# Patient Record
Sex: Female | Born: 1950
Health system: Southern US, Community
[De-identification: ages and names within clinical notes are randomized; demographics above are authoritative.]

## PROBLEM LIST (undated history)

## (undated) DIAGNOSIS — E669 Obesity, unspecified: Secondary | ICD-10-CM

## (undated) DIAGNOSIS — K589 Irritable bowel syndrome without diarrhea: Secondary | ICD-10-CM

## (undated) DIAGNOSIS — K5790 Diverticulosis of intestine, part unspecified, without perforation or abscess without bleeding: Secondary | ICD-10-CM

## (undated) DIAGNOSIS — R011 Cardiac murmur, unspecified: Secondary | ICD-10-CM

## (undated) DIAGNOSIS — F419 Anxiety disorder, unspecified: Secondary | ICD-10-CM

## (undated) DIAGNOSIS — F32A Depression, unspecified: Secondary | ICD-10-CM

## (undated) DIAGNOSIS — I10 Essential (primary) hypertension: Secondary | ICD-10-CM

## (undated) DIAGNOSIS — F988 Other specified behavioral and emotional disorders with onset usually occurring in childhood and adolescence: Secondary | ICD-10-CM

## (undated) DIAGNOSIS — K219 Gastro-esophageal reflux disease without esophagitis: Secondary | ICD-10-CM

## (undated) DIAGNOSIS — K5792 Diverticulitis of intestine, part unspecified, without perforation or abscess without bleeding: Secondary | ICD-10-CM

## (undated) DIAGNOSIS — E039 Hypothyroidism, unspecified: Secondary | ICD-10-CM

## (undated) DIAGNOSIS — R600 Localized edema: Secondary | ICD-10-CM

## (undated) DIAGNOSIS — E079 Disorder of thyroid, unspecified: Secondary | ICD-10-CM

## (undated) DIAGNOSIS — T7840XA Allergy, unspecified, initial encounter: Secondary | ICD-10-CM

## (undated) HISTORY — PX: OVARIAN CYST REMOVAL: SHX89

## (undated) HISTORY — DX: Other specified behavioral and emotional disorders with onset usually occurring in childhood and adolescence: F98.8

## (undated) HISTORY — DX: Allergy, unspecified, initial encounter: T78.40XA

## (undated) HISTORY — PX: CATARACT EXTRACTION: SUR2

## (undated) HISTORY — DX: Depression, unspecified: F32.A

## (undated) HISTORY — DX: Cardiac murmur, unspecified: R01.1

## (undated) HISTORY — DX: Gastro-esophageal reflux disease without esophagitis: K21.9

## (undated) HISTORY — DX: Hypothyroidism, unspecified: E03.9

## (undated) HISTORY — DX: Obesity, unspecified: E66.9

## (undated) HISTORY — DX: Irritable bowel syndrome, unspecified: K58.9

## (undated) HISTORY — DX: Localized edema: R60.0

## (undated) HISTORY — PX: ANKLE SURGERY: SHX546

## (undated) HISTORY — DX: Anxiety disorder, unspecified: F41.9

## (undated) HISTORY — PX: BLEPHAROPLASTY: SUR158

---

## 1979-06-14 HISTORY — PX: ABDOMINAL HYSTERECTOMY: SHX81

## 1998-08-10 ENCOUNTER — Ambulatory Visit (HOSPITAL_COMMUNITY): Admission: RE | Admit: 1998-08-10 | Discharge: 1998-08-10 | Payer: Self-pay | Admitting: Internal Medicine

## 1998-08-10 ENCOUNTER — Encounter: Payer: Self-pay | Admitting: Internal Medicine

## 1998-11-23 ENCOUNTER — Encounter: Payer: Self-pay | Admitting: Emergency Medicine

## 1998-11-23 ENCOUNTER — Emergency Department (HOSPITAL_COMMUNITY): Admission: EM | Admit: 1998-11-23 | Discharge: 1998-11-23 | Payer: Self-pay | Admitting: Emergency Medicine

## 1998-12-31 ENCOUNTER — Ambulatory Visit (HOSPITAL_COMMUNITY): Admission: RE | Admit: 1998-12-31 | Discharge: 1998-12-31 | Payer: Self-pay | Admitting: Gastroenterology

## 1999-02-26 ENCOUNTER — Other Ambulatory Visit: Admission: RE | Admit: 1999-02-26 | Discharge: 1999-02-26 | Payer: Self-pay | Admitting: Gynecology

## 1999-03-16 ENCOUNTER — Ambulatory Visit (HOSPITAL_COMMUNITY): Admission: RE | Admit: 1999-03-16 | Discharge: 1999-03-16 | Payer: Self-pay | Admitting: Gynecology

## 1999-03-24 ENCOUNTER — Encounter: Payer: Self-pay | Admitting: Gynecology

## 1999-03-24 ENCOUNTER — Inpatient Hospital Stay (HOSPITAL_COMMUNITY): Admission: RE | Admit: 1999-03-24 | Discharge: 1999-03-24 | Payer: Self-pay | Admitting: Gynecology

## 2003-08-19 ENCOUNTER — Encounter: Payer: Self-pay | Admitting: Nurse Practitioner

## 2012-12-06 ENCOUNTER — Encounter (INDEPENDENT_AMBULATORY_CARE_PROVIDER_SITE_OTHER): Payer: Self-pay | Admitting: Surgery

## 2012-12-06 ENCOUNTER — Other Ambulatory Visit (INDEPENDENT_AMBULATORY_CARE_PROVIDER_SITE_OTHER): Payer: Self-pay

## 2012-12-06 ENCOUNTER — Ambulatory Visit (INDEPENDENT_AMBULATORY_CARE_PROVIDER_SITE_OTHER): Payer: BC Managed Care – PPO | Admitting: Surgery

## 2012-12-06 DIAGNOSIS — E6609 Other obesity due to excess calories: Secondary | ICD-10-CM | POA: Insufficient documentation

## 2012-12-06 DIAGNOSIS — Z6841 Body Mass Index (BMI) 40.0 and over, adult: Secondary | ICD-10-CM

## 2012-12-06 DIAGNOSIS — I10 Essential (primary) hypertension: Secondary | ICD-10-CM

## 2012-12-06 DIAGNOSIS — K2100 Gastro-esophageal reflux disease with esophagitis, without bleeding: Secondary | ICD-10-CM

## 2012-12-06 DIAGNOSIS — E66812 Obesity, class 2: Secondary | ICD-10-CM | POA: Insufficient documentation

## 2012-12-06 NOTE — Patient Instructions (Addendum)

## 2012-12-06 NOTE — Progress Notes (Signed)
Chief Complaint:  Obesity and desires sleeve gastrectomy  History of Present Illness:  Shannon Reyes is an 62 y.o. female who has been to our seminar and desires sleeve gastrictomy.  The main drawback to this is her history of GERD.  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    She has good insight into her weight gain.  She has tried multiple attempts to lose weight without sustained success.  She grew up in [place].  Past Medical History  Diagnosis Date  . GERD (gastroesophageal reflux disease)     Past Surgical History  Procedure Laterality Date  . Cesarean section    . Ovarian cyst removal    . Abdominal hysterectomy    . Ankle fusion      Current Outpatient Prescriptions  Medication Sig Dispense Refill  . amLODipine-benazepril (LOTREL) 10-40 MG per capsule Take 1 capsule by mouth daily.      Marland Kitchen b complex-C-folic acid 1 MG capsule Take 1 capsule by mouth daily.      Marland Kitchen buPROPion (WELLBUTRIN) 75 MG tablet Take 75 mg by mouth 2 (two) times daily.      Marland Kitchen levothyroxine (SYNTHROID, LEVOTHROID) 150 MCG tablet Take 150 mcg by mouth daily before breakfast.      . lisinopril (PRINIVIL,ZESTRIL) 20 MG tablet Take 20 mg by mouth daily.      . Multiple Vitamins-Minerals (CENTRUM SILVER ULTRA WOMENS PO) Take by mouth.       No current facility-administered medications for this visit.   Review of patient's allergies indicates not on file. Family History  Problem Relation Age of Onset  . Cancer Paternal Grandmother    Social History:   reports that she has never smoked. She has never used smokeless tobacco. She reports that  drinks alcohol. She reports that she does not use illicit drugs.   REVIEW OF SYSTEMS - PERTINENT POSITIVES ONLY: Neg for DVT  Physical Exam:   Blood pressure 160/90, pulse 76, resp. rate 14, height 5' 3.75" (1.619 m),  weight 235 lb (106.595 kg). Body mass index is 40.67 kg/(m^2).  Gen:  WDWN WF NAD  Neurological: Alert and oriented to person, place, and time. Motor and sensory function is grossly intact  Head: Normocephalic and atraumatic.  Eyes: Conjunctivae are normal. Pupils are equal, round, and reactive to light. No scleral icterus.  Neck: Normal range of motion. Neck supple. No tracheal deviation or thyromegaly present.  Cardiovascular:  SR without murmurs or gallops.  No carotid bruits Respiratory: Effort normal.  No respiratory distress. No chest wall tenderness. Breath sounds normal.  No wheezes, rales or rhonchi.  Abdomen:  nontender and no prior abdominal surgery GU: Musculoskeletal: Normal range of motion. Extremities are nontender. No cyanosis, edema or clubbing noted Lymphadenopathy: No cervical, preauricular, postauricular or axillary adenopathy is present Skin: Skin is warm and dry. No rash noted. No diaphoresis. No erythema. No pallor. Pscyh: Normal mood and affect. Behavior is normal. Judgment and thought content normal.   LABORATORY RESULTS: @results48 @  RADIOLOGY RESULTS: No results found.  Problem List: There are no active problems to display for this patient.   Assessment & Plan: Morbid obesty with BMI 41 and hypertension and GERD.  Will assess for size of hiatus hernia before making a final determination as to sleeve or roux Y.  Shannon B. Daphine Deutscher, MD, [facility] [phone] beeper [phone]  12/06/2012 11:50 AM

## 2012-12-17 ENCOUNTER — Encounter (INDEPENDENT_AMBULATORY_CARE_PROVIDER_SITE_OTHER): Payer: Self-pay | Admitting: Surgery

## 2012-12-18 ENCOUNTER — Ambulatory Visit (HOSPITAL_COMMUNITY): Payer: BC Managed Care – PPO

## 2012-12-31 ENCOUNTER — Ambulatory Visit (INDEPENDENT_AMBULATORY_CARE_PROVIDER_SITE_OTHER): Payer: BC Managed Care – PPO | Admitting: Cardiovascular Disease

## 2012-12-31 ENCOUNTER — Telehealth: Payer: Self-pay | Admitting: Cardiovascular Disease

## 2012-12-31 ENCOUNTER — Ambulatory Visit: Payer: BC Managed Care – PPO | Admitting: *Deleted

## 2012-12-31 ENCOUNTER — Ambulatory Visit (HOSPITAL_COMMUNITY): Payer: BC Managed Care – PPO | Attending: Cardiology | Admitting: Radiology

## 2012-12-31 ENCOUNTER — Encounter: Payer: Self-pay | Admitting: Cardiovascular Disease

## 2012-12-31 VITALS — BP 124/80 | HR 80 | Ht 64.0 in | Wt 238.0 lb

## 2012-12-31 DIAGNOSIS — E669 Obesity, unspecified: Secondary | ICD-10-CM | POA: Insufficient documentation

## 2012-12-31 DIAGNOSIS — I2541 Coronary artery aneurysm: Secondary | ICD-10-CM | POA: Insufficient documentation

## 2012-12-31 DIAGNOSIS — I10 Essential (primary) hypertension: Secondary | ICD-10-CM | POA: Insufficient documentation

## 2012-12-31 DIAGNOSIS — Z01818 Encounter for other preprocedural examination: Secondary | ICD-10-CM

## 2012-12-31 DIAGNOSIS — I253 Aneurysm of heart: Secondary | ICD-10-CM

## 2012-12-31 DIAGNOSIS — Z01810 Encounter for preprocedural cardiovascular examination: Secondary | ICD-10-CM

## 2012-12-31 DIAGNOSIS — E7800 Pure hypercholesterolemia, unspecified: Secondary | ICD-10-CM | POA: Insufficient documentation

## 2012-12-31 DIAGNOSIS — R011 Cardiac murmur, unspecified: Secondary | ICD-10-CM | POA: Insufficient documentation

## 2012-12-31 NOTE — Assessment & Plan Note (Signed)
Diet Rx and weight loss Consider coronary calcium score next visit to further risk stratify

## 2012-12-31 NOTE — Assessment & Plan Note (Signed)
Will get records from [facility] cath 2004  Suspect it is not clinically relavant

## 2012-12-31 NOTE — Assessment & Plan Note (Signed)
F/U echo Sounds like AV sclerosis.  Doubt it is related to fistula

## 2012-12-31 NOTE — Assessment & Plan Note (Signed)
No symptoms normal ECG Low risk for cardiac complication during orthopedic surgery Clear to have this

## 2012-12-31 NOTE — Progress Notes (Signed)
Patient ID: Shannon Reyes, female   DOB: 10-09-1950, 62 y.o.   MRN: [id number] 62 yo referred by Dr Victorino Dike for orthopedic clearance .  She has no real cardiac history.  Has rod/plate in Right ankle 2004 and now plate is irritating her and needs to be removed. Same year was under a lot of stress from [employer] business in [place].  Cath with no CAD but ? Coronary fistula.  No chest pain dyspnea, palpitations or syncope.  HTN well Rx Last surgery was ankle 10 years ago with no complications.  On thyroid replacement and indicates TSH has been normal   Reviewed records from [facility] Dr Chase Caller TSH 2.78 07/12/12 HCT 41.5  LDL 124  Cr .69   ROS: Denies fever, malais, weight loss, blurry vision, decreased visual acuity, cough, sputum, SOB, hemoptysis, pleuritic pain, palpitaitons, heartburn, abdominal pain, melena, lower extremity edema, claudication, or rash.  All other systems reviewed and negative   General: Affect appropriate Overweight white female HEENT: normal Neck supple with no adenopathy JVP normal no bruits no thyromegaly Lungs clear with no wheezing and good diaphragmatic motion Heart:  S1/S2 SEM murmur,rub, gallop or click PMI normal Abdomen: benighn, BS positve, no tenderness, no AAA no bruit.  No HSM or HJR Distal pulses intact with no bruits No edema Neuro non-focal Skin warm and dry No muscular weakness  Medications Current Outpatient Prescriptions  Medication Sig Dispense Refill  . amLODipine (NORVASC) 10 MG tablet Take 10 mg by mouth daily.      Marland Kitchen buPROPion (WELLBUTRIN) 75 MG tablet Take 75 mg by mouth at bedtime.       Marland Kitchen levothyroxine (SYNTHROID, LEVOTHROID) 150 MCG tablet Take 150 mcg by mouth daily before breakfast.      . lisinopril-hydrochlorothiazide (PRINZIDE,ZESTORETIC) 20-25 MG per tablet Take 1 tablet by mouth daily.       No current facility-administered medications for this visit.    Allergies Review of patient's allergies indicates not on  file.  Family History: Family History  Problem Relation Age of Onset  . Cancer Paternal Grandmother     Social History: History   Social History  . Marital Status: Married    Spouse Name: N/A    Number of Children: N/A  . Years of Education: N/A   Occupational History  . Not on file.   Social History Main Topics  . Smoking status: Never Smoker   . Smokeless tobacco: Never Used  . Alcohol Use: Yes  . Drug Use: No  . Sexually Active: Not on file   Other Topics Concern  . Not on file   Social History Narrative  . No narrative on file    Electrocardiogram: SR rate 80 normal ECG   Assessment and Plan

## 2012-12-31 NOTE — Telephone Encounter (Signed)
New problem   Sherri/[facility] she received cardiac clearance but have a question about pt see PCP. Please call Sherri

## 2012-12-31 NOTE — Assessment & Plan Note (Signed)
Well controlled.  Continue current medications and low sodium Dash type diet.    

## 2012-12-31 NOTE — Telephone Encounter (Signed)
N/A X1 ./CY

## 2012-12-31 NOTE — Progress Notes (Signed)
Echocardiogram performed.  

## 2012-12-31 NOTE — Patient Instructions (Addendum)

## 2013-01-01 LAB — CBC WITH DIFFERENTIAL/PLATELET
Basophils Absolute: 0.1 10*3/uL (ref 0.0–0.1)
Basophils Relative: 1 % (ref 0–1)
Eosinophils Relative: 3 % (ref 0–5)
HCT: 39.3 % (ref 36.0–46.0)
Lymphocytes Relative: 28 % (ref 12–46)
MCHC: 33.3 g/dL (ref 30.0–36.0)
Monocytes Absolute: 0.5 10*3/uL (ref 0.1–1.0)
Neutro Abs: 5.5 10*3/uL (ref 1.7–7.7)
Platelets: 280 10*3/uL (ref 150–400)
RDW: 15.1 % (ref 11.5–15.5)
WBC: 8.8 10*3/uL (ref 4.0–10.5)

## 2013-01-01 LAB — TSH: TSH: 2.11 u[IU]/mL (ref 0.350–4.500)

## 2013-01-03 ENCOUNTER — Ambulatory Visit (HOSPITAL_COMMUNITY)
Admission: RE | Admit: 2013-01-03 | Discharge: 2013-01-03 | Disposition: A | Discharge status: Home / Self Care | Payer: BC Managed Care – PPO | Source: Ambulatory Visit | Attending: Surgery | Admitting: Surgery

## 2013-01-03 DIAGNOSIS — K449 Diaphragmatic hernia without obstruction or gangrene: Secondary | ICD-10-CM | POA: Insufficient documentation

## 2013-01-03 DIAGNOSIS — K219 Gastro-esophageal reflux disease without esophagitis: Secondary | ICD-10-CM | POA: Insufficient documentation

## 2013-01-03 DIAGNOSIS — Z01812 Encounter for preprocedural laboratory examination: Secondary | ICD-10-CM | POA: Insufficient documentation

## 2013-01-03 DIAGNOSIS — Z780 Asymptomatic menopausal state: Secondary | ICD-10-CM | POA: Insufficient documentation

## 2013-01-03 DIAGNOSIS — Z01818 Encounter for other preprocedural examination: Secondary | ICD-10-CM | POA: Insufficient documentation

## 2013-01-03 DIAGNOSIS — Z6841 Body Mass Index (BMI) 40.0 and over, adult: Secondary | ICD-10-CM | POA: Insufficient documentation

## 2013-01-03 DIAGNOSIS — R011 Cardiac murmur, unspecified: Secondary | ICD-10-CM | POA: Insufficient documentation

## 2013-01-03 DIAGNOSIS — Z13820 Encounter for screening for osteoporosis: Secondary | ICD-10-CM | POA: Insufficient documentation

## 2013-01-03 DIAGNOSIS — Z1231 Encounter for screening mammogram for malignant neoplasm of breast: Secondary | ICD-10-CM | POA: Insufficient documentation

## 2013-01-03 DIAGNOSIS — I10 Essential (primary) hypertension: Secondary | ICD-10-CM | POA: Insufficient documentation

## 2013-01-10 NOTE — Telephone Encounter (Signed)
UNABLE  TO REACH SHERI AT  SAID NUMBER   IF NEEDS ANSWER  WILL NEED TO CALL OFFICE BACK   .Zack Seal

## 2013-01-25 ENCOUNTER — Ambulatory Visit (INDEPENDENT_AMBULATORY_CARE_PROVIDER_SITE_OTHER): Payer: BC Managed Care – PPO | Admitting: Surgery

## 2013-01-25 ENCOUNTER — Encounter (INDEPENDENT_AMBULATORY_CARE_PROVIDER_SITE_OTHER): Payer: Self-pay | Admitting: Surgery

## 2013-01-25 ENCOUNTER — Telehealth (INDEPENDENT_AMBULATORY_CARE_PROVIDER_SITE_OTHER): Payer: Self-pay

## 2013-01-25 VITALS — BP 118/86 | HR 84 | Resp 16 | Ht 64.0 in | Wt 241.4 lb

## 2013-01-25 DIAGNOSIS — Z6841 Body Mass Index (BMI) 40.0 and over, adult: Secondary | ICD-10-CM

## 2013-01-25 DIAGNOSIS — K219 Gastro-esophageal reflux disease without esophagitis: Secondary | ICD-10-CM | POA: Insufficient documentation

## 2013-01-25 DIAGNOSIS — K2100 Gastro-esophageal reflux disease with esophagitis, without bleeding: Secondary | ICD-10-CM

## 2013-01-25 NOTE — Progress Notes (Signed)
Shannon Reyes 62 y.o.  Body mass index is 41.42 kg/(m^2).  Patient Active Problem List   Diagnosis Date Noted  . HTN (hypertension) 12/31/2012  . Murmur 12/31/2012  . 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 Elevated LDL cholesterol level 12/31/2012  . Preop cardiovascular exam 12/31/2012  . Morbid obesity-BMI 41 12/06/2012    Allergies  Allergen Reactions  . Codeine     Past Surgical History  Procedure Laterality Date  . Cesarean section    . Ovarian cyst removal    . Abdominal hysterectomy    . Ankle surgery Right 2004/2014   No primary provider on file. No diagnosis found.  Shannon Reyes and her husband came in today to discuss some of the preliminary work that we had done when  she was interested in bariatric surgery.  She has stents had some hardware removed from her right ankle and is wanting to try lifestyle change to reduce her weight as her BMI is around 41. On her upper GI series that I obtained because she does have symptomatic GERD the report showed no evidence of hiatal hernia and it showed no reflux. However upon my review I thought I could see some gastric striations going above the diaphragm. I would like to get an upper endoscopy and a GI consultation to try to help clarify this and also to better treat her reflux. This is in the face of a recent changes in some chest sensations that she has with eating which may either be spasm or inadequately treated GERD. She also needed assessment for Barrett's  Will refer to Dr. Bernette Redbird.  I will follow up with Shannon Reyes when necessary and if she should change her mind and desire bariatric surgery. Matt B. Daphine Deutscher, MD, [facility] [phone] beeper [phone]  01/25/2013 9:50 AM

## 2013-01-25 NOTE — Addendum Note (Signed)
Addended by: Maryan Puls on: 01/25/2013 12:51 PM   Modules accepted: Orders

## 2013-01-25 NOTE — Telephone Encounter (Signed)
I rec'd a call from Dr. Donavan Burnet office re: Referral to Gastroenterology.  I created an addendum from oday's visit and placed the Referral to GI. Also,  I faxed over office notes, imaging and demographics   They will call the patient with an appointment.  Referral order has been given to [facility].

## 2013-01-25 NOTE — Patient Instructions (Signed)
Gastroesophageal Reflux Disease, Adult  Gastroesophageal reflux disease (GERD) happens when acid from your stomach flows up into the esophagus. When acid comes in contact with the esophagus, the acid causes soreness (inflammation) in the esophagus. Over time, GERD may create small holes (ulcers) in the lining of the esophagus.  CAUSES   · Increased body weight. This puts pressure on the stomach, making acid rise from the stomach into the esophagus.  · Smoking. This increases acid production in the stomach.  · Drinking alcohol. This causes decreased pressure in the lower esophageal sphincter (valve or ring of muscle between the esophagus and stomach), allowing acid from the stomach into the esophagus.  · Late evening meals and a full stomach. This increases pressure and acid production in the stomach.  · A malformed lower esophageal sphincter.  Sometimes, no cause is found.  SYMPTOMS   · Burning pain in the lower part of the mid-chest behind the breastbone and in the mid-stomach area. This may occur twice a week or more often.  · Trouble swallowing.  · Sore throat.  · Dry cough.  · Asthma-like symptoms including chest tightness, shortness of breath, or wheezing.  DIAGNOSIS   Your caregiver may be able to diagnose GERD based on your symptoms. In some cases, X-rays and other tests may be done to check for complications or to check the condition of your stomach and esophagus.  TREATMENT   Your caregiver may recommend over-the-counter or prescription medicines to help decrease acid production. Ask your caregiver before starting or adding any new medicines.   HOME CARE INSTRUCTIONS   · Change the factors that you can control. Ask your caregiver for guidance concerning weight loss, quitting smoking, and alcohol consumption.  · Avoid foods and drinks that make your symptoms worse, such as:  · Caffeine or alcoholic drinks.  · Chocolate.  · Peppermint or mint flavorings.  · Garlic and onions.  · Spicy foods.  · Citrus fruits,  such as oranges, lemons, or limes.  · Tomato-based foods such as sauce, chili, salsa, and pizza.  · Fried and fatty foods.  · Avoid lying down for the 3 hours prior to your bedtime or prior to taking a nap.  · Eat small, frequent meals instead of large meals.  · Wear loose-fitting clothing. Do not wear anything tight around your waist that causes pressure on your stomach.  · Raise the head of your bed 6 to 8 inches with wood blocks to help you sleep. Extra pillows will not help.  · Only take over-the-counter or prescription medicines for pain, discomfort, or fever as directed by your caregiver.  · Do not take aspirin, ibuprofen, or other nonsteroidal anti-inflammatory drugs (NSAIDs).  SEEK IMMEDIATE MEDICAL CARE IF:   · You have pain in your arms, neck, jaw, teeth, or back.  · Your pain increases or changes in intensity or duration.  · You develop nausea, vomiting, or sweating (diaphoresis).  · You develop shortness of breath, or you faint.  · Your vomit is green, yellow, black, or looks like coffee grounds or blood.  · Your stool is red, bloody, or black.  These symptoms could be signs of other problems, such as heart disease, gastric bleeding, or esophageal bleeding.  MAKE SURE YOU:   · Understand these instructions.  · Will watch your condition.  · Will get help right away if you are not doing well or get worse.  Document Released: 03/09/2005 Document Revised: 08/22/2011 Document Reviewed: 12/17/2010  [employer]® Patient   Information ©2014 [employer].

## 2013-03-22 ENCOUNTER — Encounter (INDEPENDENT_AMBULATORY_CARE_PROVIDER_SITE_OTHER): Payer: Self-pay

## 2013-04-18 ENCOUNTER — Other Ambulatory Visit: Payer: Self-pay

## 2014-07-22 ENCOUNTER — Other Ambulatory Visit: Payer: Self-pay | Admitting: Family Medicine

## 2014-07-22 DIAGNOSIS — Z1231 Encounter for screening mammogram for malignant neoplasm of breast: Secondary | ICD-10-CM

## 2014-07-28 ENCOUNTER — Ambulatory Visit: Payer: Self-pay

## 2014-12-18 ENCOUNTER — Ambulatory Visit
Admission: RE | Admit: 2014-12-18 | Discharge: 2014-12-18 | Disposition: A | Discharge status: Home / Self Care | Payer: BLUE CROSS/BLUE SHIELD | Source: Ambulatory Visit | Attending: Family Medicine | Admitting: Family Medicine

## 2014-12-18 DIAGNOSIS — Z1231 Encounter for screening mammogram for malignant neoplasm of breast: Secondary | ICD-10-CM

## 2015-09-18 DIAGNOSIS — H02834 Dermatochalasis of left upper eyelid: Secondary | ICD-10-CM | POA: Diagnosis not present

## 2015-09-18 DIAGNOSIS — H02831 Dermatochalasis of right upper eyelid: Secondary | ICD-10-CM | POA: Diagnosis not present

## 2015-10-23 DIAGNOSIS — J302 Other seasonal allergic rhinitis: Secondary | ICD-10-CM | POA: Diagnosis not present

## 2015-10-23 DIAGNOSIS — I10 Essential (primary) hypertension: Secondary | ICD-10-CM | POA: Diagnosis not present

## 2015-11-01 ENCOUNTER — Ambulatory Visit (HOSPITAL_COMMUNITY)
Admission: EM | Admit: 2015-11-01 | Discharge: 2015-11-01 | Disposition: A | Discharge status: Home / Self Care | Payer: BLUE CROSS/BLUE SHIELD | Attending: Emergency Medicine | Admitting: Emergency Medicine

## 2015-11-01 ENCOUNTER — Encounter (HOSPITAL_COMMUNITY): Payer: Self-pay

## 2015-11-01 DIAGNOSIS — J111 Influenza due to unidentified influenza virus with other respiratory manifestations: Secondary | ICD-10-CM

## 2015-11-01 DIAGNOSIS — R69 Illness, unspecified: Principal | ICD-10-CM

## 2015-11-01 MED ORDER — OSELTAMIVIR PHOSPHATE 75 MG PO CAPS: 75.0000 mg | ORAL_CAPSULE | Freq: Two times a day (BID) | ORAL | Status: DC

## 2015-11-01 NOTE — ED Notes (Signed)
Patient presents with possible flu-like symptoms. Pt complains of body aches, headache, chills, and night sweats. Pt has taken Ibuprofen and extra strength Tylenol but they are not helping No acute distress

## 2015-11-01 NOTE — ED Provider Notes (Signed)
HPI  SUBJECTIVE:  Shannon Reyes is a 65 y.o. female who presents with headache, body ache, chills, left ear pain, sore throat, postnasal drip, nausea starting last night. Symptoms better with Tylenol, no aggravating factors. She has tried extra strength Tylenol and ibuprofen 800 mg. Last dose of Tylenol was approximately 4 hours prior to evaluation. She denies nasal congestion, rhinorrhea, purulent nasal drainage, allergy symptoms. No coughing, wheezing, chest pain, shortness of breath. No abdominal pain, vomiting, diarrhea. No urinary complaints. No rash, boils, open wounds, known tick bite. Reports mild photophobia, but denies neck stiffness. She states that her daughter had identical symptoms earlier this week. Past medical histories of hypertension, seasonal allergies. No history of diabetes, hypothyroidism, immunocompromise. PMD: Dr. Gweneth Dimitri    Past Medical History  Diagnosis Date  . GERD (gastroesophageal reflux disease)     Past Surgical History  Procedure Laterality Date  . Cesarean section    . Ovarian cyst removal    . Abdominal hysterectomy    . Ankle surgery Right 2004/2014    Family History  Problem Relation Age of Onset  . Cancer Paternal Grandmother     Social History  Substance Use Topics  . Smoking status: Never Smoker   . Smokeless tobacco: Never Used  . Alcohol Use: 6.0 oz/week    10 Glasses of wine per week    No current facility-administered medications for this encounter.  Current outpatient prescriptions:  .  amLODipine (NORVASC) 10 MG tablet, Take 10 mg by mouth daily., Disp: , Rfl:  .  buPROPion (WELLBUTRIN) 75 MG tablet, Take 75 mg by mouth at bedtime. , Disp: , Rfl:  .  levothyroxine (SYNTHROID, LEVOTHROID) 150 MCG tablet, Take 150 mcg by mouth daily before breakfast., Disp: , Rfl:  .  lisinopril-hydrochlorothiazide (PRINZIDE,ZESTORETIC) 20-25 MG per tablet, Take 1 tablet by mouth daily., Disp: , Rfl:  .  HYDROcodone-acetaminophen  (NORCO/VICODIN) 5-325 MG per tablet, Take 1 tablet by mouth every 6 (six) hours as needed for pain., Disp: , Rfl:  .  oseltamivir (TAMIFLU) 75 MG capsule, Take 1 capsule (75 mg total) by mouth 2 (two) times daily. X 5 days, Disp: 10 capsule, Rfl: 0  Allergies  Allergen Reactions  . Codeine      ROS  As noted in HPI.   Physical Exam  BP 160/99 mmHg  Pulse 86  Temp(Src) 99.4 F (37.4 C) (Oral)  Resp 20  SpO2 99%  Constitutional: Well developed, well nourished, no acute distress Eyes: PERRL, EOMI, conjunctiva normal bilaterally HENT: Normocephalic, atraumatic,mucus membranes moist. TMs normal bilaterally. Normal nares. No nasal congestion. Positive frontal sinus tenderness, no maxillary sinus tenderness. Normal oropharynx. No cobblestoning, postnasal drip. Neck: No cervical lymphadenopathy, meningismus. Respiratory: Clear to auscultation bilaterally, no rales, no wheezing, no rhonchi Cardiovascular: Normal rate and rhythm, no murmurs, no gallops, no rubs GI: Soft, nondistended, normal bowel sounds, nontender, no rebound, no guarding No suprapubic or flank tenderness sk: no CVAT skin: No rash, skin intact Musculoskeletal: No edema, no tenderness, no deformities Neurologic: Alert & oriented x 3, CN II-XII grossly intact, no motor deficits, sensation grossly intact Psychiatric: Speech and behavior appropriate   ED Course   Medications - No data to display  No orders of the defined types were placed in this encounter.   No results found for this or any previous visit (from the past 24 hour(s)). No results found.  ED Clinical Impression  Influenza-like illness   ED Assessment/Plan  Presentation consistent with influenza-like illness. She has  taken Tylenol within the past 6 hours, so she is afebrile here.  otitis, sinusitis. Sinus tenderness noted, patient has no purulent nasal drainage. No evidence of meningitis, pneumonia, intra-abdominal process. Doubt UTI. Given that  her daughter had identical symptoms which responded with Tamiflu, we'll send her home with the same. She states she has plenty of ibuprofen at home. Also in the differential is tickborne illness/fever, however patient does not recall tick bite and does not have a rash at this current time. Follow-up with primary care physician as needed. Go to the ER if gets worse. Discussed medical decision-making, plan for follow-up, signs and symptoms that should prompt return to the emergency department. Patient agrees with plan.   *This clinic note was created using Dragon dictation software. Therefore, there may be occasional mistakes despite careful proofreading.  ?  Domenick GongAshley Danyale Ridinger, MD 11/01/15 [phone]

## 2015-11-01 NOTE — Discharge Instructions (Signed)
Continue Flonase, saline nasal irrigation. 800 mg of ibuprofen with 1 g of Tylenol 3 times a day. This is an effective combination for pain. Drink plenty of fluids, and get some rest. Go to the ER for the signs and symptoms we discussed

## 2015-12-02 ENCOUNTER — Other Ambulatory Visit: Payer: Self-pay | Admitting: Family Medicine

## 2015-12-02 DIAGNOSIS — Z1231 Encounter for screening mammogram for malignant neoplasm of breast: Secondary | ICD-10-CM

## 2015-12-03 DIAGNOSIS — F411 Generalized anxiety disorder: Secondary | ICD-10-CM | POA: Diagnosis not present

## 2015-12-03 DIAGNOSIS — F902 Attention-deficit hyperactivity disorder, combined type: Secondary | ICD-10-CM | POA: Diagnosis not present

## 2015-12-03 DIAGNOSIS — F4325 Adjustment disorder with mixed disturbance of emotions and conduct: Secondary | ICD-10-CM | POA: Diagnosis not present

## 2015-12-09 DIAGNOSIS — N952 Postmenopausal atrophic vaginitis: Secondary | ICD-10-CM | POA: Diagnosis not present

## 2015-12-09 DIAGNOSIS — I10 Essential (primary) hypertension: Secondary | ICD-10-CM | POA: Diagnosis not present

## 2015-12-09 DIAGNOSIS — F339 Major depressive disorder, recurrent, unspecified: Secondary | ICD-10-CM | POA: Diagnosis not present

## 2015-12-09 DIAGNOSIS — E039 Hypothyroidism, unspecified: Secondary | ICD-10-CM | POA: Diagnosis not present

## 2015-12-09 DIAGNOSIS — Z0001 Encounter for general adult medical examination with abnormal findings: Secondary | ICD-10-CM | POA: Diagnosis not present

## 2015-12-21 ENCOUNTER — Ambulatory Visit
Admission: RE | Admit: 2015-12-21 | Discharge: 2015-12-21 | Disposition: A | Discharge status: Home / Self Care | Payer: BLUE CROSS/BLUE SHIELD | Source: Ambulatory Visit | Attending: Family Medicine | Admitting: Family Medicine

## 2015-12-21 DIAGNOSIS — Z1231 Encounter for screening mammogram for malignant neoplasm of breast: Secondary | ICD-10-CM

## 2016-01-08 DIAGNOSIS — R600 Localized edema: Secondary | ICD-10-CM | POA: Diagnosis not present

## 2016-01-08 DIAGNOSIS — I10 Essential (primary) hypertension: Secondary | ICD-10-CM | POA: Diagnosis not present

## 2016-01-27 DIAGNOSIS — J302 Other seasonal allergic rhinitis: Secondary | ICD-10-CM | POA: Diagnosis not present

## 2016-01-27 DIAGNOSIS — I10 Essential (primary) hypertension: Secondary | ICD-10-CM | POA: Diagnosis not present

## 2016-03-15 DIAGNOSIS — H903 Sensorineural hearing loss, bilateral: Secondary | ICD-10-CM | POA: Insufficient documentation

## 2016-03-15 DIAGNOSIS — F902 Attention-deficit hyperactivity disorder, combined type: Secondary | ICD-10-CM | POA: Diagnosis not present

## 2016-03-15 DIAGNOSIS — F331 Major depressive disorder, recurrent, moderate: Secondary | ICD-10-CM | POA: Diagnosis not present

## 2016-03-15 DIAGNOSIS — F4325 Adjustment disorder with mixed disturbance of emotions and conduct: Secondary | ICD-10-CM | POA: Diagnosis not present

## 2016-03-15 DIAGNOSIS — H9313 Tinnitus, bilateral: Secondary | ICD-10-CM | POA: Diagnosis not present

## 2016-03-15 DIAGNOSIS — J3089 Other allergic rhinitis: Secondary | ICD-10-CM | POA: Insufficient documentation

## 2016-03-15 DIAGNOSIS — F411 Generalized anxiety disorder: Secondary | ICD-10-CM | POA: Diagnosis not present

## 2016-03-15 DIAGNOSIS — H6983 Other specified disorders of Eustachian tube, bilateral: Secondary | ICD-10-CM | POA: Diagnosis not present

## 2016-03-22 ENCOUNTER — Emergency Department (HOSPITAL_COMMUNITY)
Admission: EM | Admit: 2016-03-22 | Discharge: 2016-03-22 | Disposition: A | Discharge status: Home / Self Care | Payer: BLUE CROSS/BLUE SHIELD | Attending: Emergency Medicine | Admitting: Emergency Medicine

## 2016-03-22 ENCOUNTER — Emergency Department (HOSPITAL_COMMUNITY): Payer: BLUE CROSS/BLUE SHIELD

## 2016-03-22 ENCOUNTER — Encounter (HOSPITAL_COMMUNITY): Payer: Self-pay | Admitting: Emergency Medicine

## 2016-03-22 DIAGNOSIS — Z79899 Other long term (current) drug therapy: Secondary | ICD-10-CM | POA: Diagnosis not present

## 2016-03-22 DIAGNOSIS — R079 Chest pain, unspecified: Secondary | ICD-10-CM | POA: Diagnosis not present

## 2016-03-22 DIAGNOSIS — R0789 Other chest pain: Secondary | ICD-10-CM | POA: Diagnosis not present

## 2016-03-22 DIAGNOSIS — I10 Essential (primary) hypertension: Secondary | ICD-10-CM | POA: Insufficient documentation

## 2016-03-22 DIAGNOSIS — R05 Cough: Secondary | ICD-10-CM | POA: Diagnosis not present

## 2016-03-22 HISTORY — DX: Essential (primary) hypertension: I10

## 2016-03-22 LAB — BASIC METABOLIC PANEL
ANION GAP: 11 (ref 5–15)
BUN: 14 mg/dL (ref 6–20)
CHLORIDE: 101 mmol/L (ref 101–111)
CO2: 24 mmol/L (ref 22–32)
Calcium: 10 mg/dL (ref 8.9–10.3)
Creatinine, Ser: 0.77 mg/dL (ref 0.44–1.00)
GFR calc non Af Amer: >60 mL/min (ref >60)
Glucose, Bld: 101 mg/dL — ABNORMAL HIGH (ref 65–99)
POTASSIUM: 3.4 mmol/L — AB (ref 3.5–5.1)
SODIUM: 136 mmol/L (ref 135–145)

## 2016-03-22 LAB — CBC
HEMATOCRIT: 41.7 % (ref 36.0–46.0)
HEMOGLOBIN: 14.2 g/dL (ref 12.0–15.0)
MCH: 30.9 pg (ref 26.0–34.0)
MCHC: 34.1 g/dL (ref 30.0–36.0)
MCV: 90.8 fL (ref 78.0–100.0)
PLATELETS: 241 10*3/uL (ref 150–400)
RBC: 4.59 MIL/uL (ref 3.87–5.11)
RDW: 14.2 % (ref 11.5–15.5)
WBC: 8.2 10*3/uL (ref 4.0–10.5)

## 2016-03-22 LAB — I-STAT TROPONIN, ED: Troponin i, poc: 0 ng/mL (ref 0.00–0.08)

## 2016-03-22 NOTE — Discharge Instructions (Signed)
Restart your Nexium and take it daily for 1 month.  Use an antacid such as Maalox, before meals and at bedtime.  Follow-up with your primary care doctor for checkup in one week.  Return here if needed, for problems.

## 2016-03-22 NOTE — ED Triage Notes (Signed)
Pt sts mid sternal CP and diaphoresis x 3 days worse today

## 2016-03-22 NOTE — ED Provider Notes (Signed)
[facility] DEPT Provider Note   CSN: [id number] Arrival date & time: 03/22/16  1517     History   Chief Complaint Chief Complaint  Patient presents with  . Chest Pain    HPI Shannon Reyes is a 65 y.o. female.  She presents for evaluation of chest discomfort, present for 3 days, waxing and waning, associated with sweating. The pain is dull in nature and radiates to her mid back. She saw her PCP today, and was treated with a "cocktail", and sent here because her EKG was "abnormal." She states that her doctor, "sent the EKG here". She stopped taking her Nexium, several months ago because she was doing better, after being on it for 15 years. She has been using Tums in the last few days, without relief of her discomfort. She has a history of "coronary fistula", evaluated by her cardiologist, in 2014, after she had a cardiac catheterization in [place], [place]. At that point, he elected to observe the patient. Cardiac catheter apparently showed otherwise normal coronary arteries without obstruction. There are no other known modifying factors.  HPI  Past Medical History:  Diagnosis Date  . GERD (gastroesophageal reflux disease)   . Hypertension     Patient Active Problem List   Diagnosis Date Noted  . GERD (gastroesophageal reflux disease) 01/25/2013  . HTN (hypertension) 12/31/2012  . Murmur 12/31/2012  . 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 Elevated LDL cholesterol level 12/31/2012  . Preop cardiovascular exam 12/31/2012  . Morbid obesity-BMI 41 12/06/2012    Past Surgical History:  Procedure Laterality Date  . ABDOMINAL HYSTERECTOMY    . ANKLE SURGERY Right 2004/2014  . CESAREAN SECTION    . OVARIAN CYST REMOVAL      OB History    No data available       Home Medications    Prior to Admission medications   Medication Sig Start Date End Date Taking? Authorizing Provider  amLODipine (NORVASC) 10 MG tablet Take 10 mg by mouth daily.   Yes Historical  Provider, MD  amoxicillin-clavulanate (AUGMENTIN) 875-125 MG tablet Take 1 tablet by mouth 2 (two) times daily. 03/15/16 03/25/16 Yes Historical Provider, MD  buPROPion (WELLBUTRIN XL) 300 MG 24 hr tablet Take 300 mg by mouth daily.   Yes Historical Provider, MD  fluticasone (FLONASE) 50 MCG/ACT nasal spray Place 1 spray into both nostrils 2 (two) times daily.   Yes Historical Provider, MD  levothyroxine (SYNTHROID, LEVOTHROID) 150 MCG tablet Take 150 mcg by mouth daily before breakfast.   Yes Historical Provider, MD  lisinopril-hydrochlorothiazide (PRINZIDE,ZESTORETIC) 20-25 MG per tablet Take 1 tablet by mouth daily.   Yes Historical Provider, MD  loratadine (CLARITIN) 10 MG tablet Take 10 mg by mouth daily.   Yes Historical Provider, MD  oseltamivir (TAMIFLU) 75 MG capsule Take 1 capsule (75 mg total) by mouth 2 (two) times daily. X 5 days Patient not taking: Reported on 03/22/2016 11/01/15   Domenick Gong, MD    Family History Family History  Problem Relation Age of Onset  . Cancer Paternal Grandmother     Social History Social History  Substance Use Topics  . Smoking status: Never Smoker  . Smokeless tobacco: Never Used  . Alcohol use 6.0 oz/week    10 Glasses of wine per week     Allergies   Codeine   Review of Systems Review of Systems  All other systems reviewed and are negative.    Physical Exam Updated Vital Signs BP 145/93  Pulse 92   Temp 98.1 F (36.7 C) (Oral)   Resp 21   SpO2 98%   Physical Exam  Constitutional: She is oriented to person, place, and time. She appears well-developed and well-nourished. No distress.  HENT:  Head: Normocephalic and atraumatic.  Eyes: Conjunctivae and EOM are normal. Pupils are equal, round, and reactive to light.  Neck: Normal range of motion and phonation normal. Neck supple.  Cardiovascular: Normal rate and regular rhythm.   Pulmonary/Chest: Effort normal and breath sounds normal. She exhibits no tenderness.    Abdominal: Soft. She exhibits no distension. There is no tenderness. There is no guarding.  Musculoskeletal: Normal range of motion.  Neurological: She is alert and oriented to person, place, and time. She exhibits normal muscle tone.  Skin: Skin is warm and dry.  Psychiatric: She has a normal mood and affect. Her behavior is normal. Judgment and thought content normal.  Nursing note and vitals reviewed.    ED Treatments / Results  Labs (all labs ordered are listed, but only abnormal results are displayed) Labs Reviewed  BASIC METABOLIC PANEL - Abnormal; Notable for the following:       Result Value   Potassium 3.4 (*)    Glucose, Bld 101 (*)    All other components within normal limits  CBC  I-STAT TROPOININ, ED    EKG  EKG Interpretation  Date/Time:  Tuesday March 22 2016 15:22:43 EDT Ventricular Rate:  91 PR Interval:  174 QRS Duration: 108 QT Interval:  374 QTC Calculation: 460 R Axis:   -27 Text Interpretation:  Normal sinus rhythm Low voltage QRS Possible Anterolateral infarct , age undetermined Abnormal ECG No old tracing to compare Confirmed by [facility] Hills[facility]ENTZ  MD, Nanami Whitelaw [phone]([id number]) on 03/22/2016 7:51:50 PM       Radiology Dg Chest 2 View  Result Date: 03/22/2016 CLINICAL DATA:  Ongoing headaches, lethargy and dry cough. Mid sternal chest pressure for several days. Abnormal EKG EXAM: CHEST  2 VIEW COMPARISON:  01/03/2013 FINDINGS: No acute infiltrate or effusion. Linear atelectasis left base. Cardiomediastinal silhouette nonenlarged. No pneumothorax. Degenerative changes of the spine. IMPRESSION: No active cardiopulmonary disease. Electronically Signed   By: Jasmine PangKim  Fujinaga M.D.   On: 03/22/2016 15:59    Procedures Procedures (including critical care time)  Medications Ordered in ED Medications - No data to display   Initial Impression / Assessment and Plan / ED Course  I have reviewed the triage vital signs and the nursing notes.  Pertinent labs & imaging results  that were available during my care of the patient were reviewed by me and considered in my medical decision making (see chart for details).  Clinical Course    Medications - No data to display  Patient Vitals for the past 24 hrs:  BP Temp Temp src Pulse Resp SpO2  03/22/16 2100 142/82 - - 71 18 97 %  03/22/16 2030 142/97 - - 73 24 98 %  03/22/16 2012 145/93 - - - 21 -  03/22/16 1521 158/93 98.1 F (36.7 C) Oral 92 18 98 %    At D/C Reevaluation with update and discussion. After initial assessment and treatment, an updated evaluation reveals No change in clinical status. The patient is angry, "pissed", but had no additional physical complaints. Findings discussed with patient, husband, all questions were answered. She prefers to restart OTC Nexium, and take an antacid before meals and at bedtime, to treat her discomfort. She stated she would call her cardiologist, to be checked on.  Udell Mazzocco L    Final Clinical Impressions(s) / ED Diagnoses   Final diagnoses:  Nonspecific chest pain    Nonspecific chest pain, doubt acute coronary syndrome, PE or pneumonia. Symptoms are most likely related to acid, and possibly reflux esophagitis.  Nursing Notes Reviewed/ Care Coordinated Applicable Imaging Reviewed Interpretation of Laboratory Data incorporated into ED treatment  The patient appears reasonably screened and/or stabilized for discharge and I doubt any other medical condition or other [facility] requiring further screening, evaluation, or treatment in the ED at this time prior to discharge.  Plan: Home Medications- PPI of choice in an antacid, before meals,  And at bedtime; Home Treatments- rest; return here if the recommended treatment, does not improve the symptoms; Recommended follow up- PCP checkup in one week.   New Prescriptions New Prescriptions   No medications on file     Mancel Bale, MD 03/22/16 2153

## 2016-03-25 ENCOUNTER — Telehealth: Payer: Self-pay | Admitting: Cardiovascular Disease

## 2016-03-25 NOTE — Telephone Encounter (Signed)
Post hosp only want to see Dr Eden EmmsNishan,  [age]yr last visit.

## 2016-04-01 NOTE — Telephone Encounter (Signed)
Patient aware of Dr. Fabio BeringNishan's recommendations. Will forward to [name] NP, so she is aware.

## 2016-04-01 NOTE — Telephone Encounter (Signed)
F/U Call  [name] is calling because she has been having some chest tightness and some pain in her back that radiates to the front . Went to the E/R on 03/23/16 but was sent home because they said she had no symptoms of  Heart attack . She is schedule to come in on 04/06/16 to [name] . Please call

## 2016-04-01 NOTE — Telephone Encounter (Signed)
Patient calling about an episode of chest pain she had over a week ago (03/22/16) that lasted for about 10 hours. Patient went to the ER at that time. Patient had chest pain that she states felt like pressure. Patient also complained of a burning between her shoulder blades at that time. Patient stated she has been tired all the time, not sleeping well at night, and she states she just feels uncomfortable all the time. Patient stated she has also had ankle swelling and feeling like she is carrying extra fluid around. Patient is very concerned due to her history of coronary artery fistula and family history. Patient stated about three months ago her PCP increased her amlodipine to 10 mg due to her elevated BP.  Informed patient that at her ER visit her troponin was negative, which was a good thing. Patient feels like something is just not right. Will forward to Dr. Eden EmmsNishan for advisement.

## 2016-04-01 NOTE — Telephone Encounter (Signed)
Ok to see Shannon Reyes next week may be a good candidate for cardiac CT See if we can get records from [facility] from old cath

## 2016-04-03 NOTE — Telephone Encounter (Signed)
Did you request the records?

## 2016-04-04 ENCOUNTER — Encounter: Payer: Self-pay | Admitting: Nurse Practitioner

## 2016-04-04 ENCOUNTER — Telehealth: Payer: Self-pay | Admitting: Nurse Practitioner

## 2016-04-05 NOTE — Telephone Encounter (Signed)
Medical Records and Chart Prep are working on getting records from [facility].

## 2016-04-06 ENCOUNTER — Ambulatory Visit (INDEPENDENT_AMBULATORY_CARE_PROVIDER_SITE_OTHER): Payer: BLUE CROSS/BLUE SHIELD | Admitting: Nurse Practitioner

## 2016-04-06 ENCOUNTER — Encounter: Payer: Self-pay | Admitting: Nurse Practitioner

## 2016-04-06 VITALS — BP 128/80 | HR 69 | Ht 63.0 in | Wt 231.1 lb

## 2016-04-06 DIAGNOSIS — I253 Aneurysm of heart: Secondary | ICD-10-CM

## 2016-04-06 DIAGNOSIS — I2541 Coronary artery aneurysm: Secondary | ICD-10-CM

## 2016-04-06 DIAGNOSIS — R0789 Other chest pain: Secondary | ICD-10-CM

## 2016-04-06 LAB — BASIC METABOLIC PANEL
BUN: 12 mg/dL (ref 7–25)
CO2: 28 mmol/L (ref 20–31)
Calcium: 9.4 mg/dL (ref 8.6–10.4)
Chloride: 98 mmol/L (ref 98–110)
Creat: 0.66 mg/dL (ref 0.50–0.99)
Glucose, Bld: 102 mg/dL — ABNORMAL HIGH (ref 65–99)
Potassium: 4 mmol/L (ref 3.5–5.3)
Sodium: 135 mmol/L (ref 135–146)

## 2016-04-06 MED ORDER — METOPROLOL TARTRATE 25 MG PO TABS: 25.0000 mg | ORAL_TABLET | Freq: Once | ORAL | 3 refills | Status: DC

## 2016-04-06 NOTE — Progress Notes (Addendum)
CARDIOLOGY OFFICE NOTE  Date:  04/06/2016    [name] Date of Birth: 11-Nov-1950 Medical Record #[id number]  PCP:  Gweneth Dimitri, MD  Cardiologist:  Eden Emms  Chief Complaint  Patient presents with  . Chest Pain    Work in visit - seen for Dr. Eden Emms    History of Present Illness: Shannon Reyes is a 65 y.o. female who presents today for a work in visit. Seen for Dr. Eden Emms.   She has a history of no known CAD - normal catheterization back in 2012 noted at [facility] in [place] but with fistulous tract from the distal left main into visually the pulmonary artery. She had elevated right heart pressures and elevated pulmonary capillary wedge pressure and elevated left ventricular end diastolic pressure.   Seen back in 2014 for a pre op clearance by Dr. Eden Emms.   Called last week with "an episode of chest pain she had over a week ago (03/22/16) that lasted for about 10 hours. Patient went to the ER at that time. Patient had chest pain that she states felt like pressure. Patient also complained of a burning between her shoulder blades at that time. Patient stated she has been tired all the time, not sleeping well at night, and she states she just feels uncomfortable all the time. Patient stated she has also had ankle swelling and feeling like she is carrying extra fluid around. Patient is very concerned due to her history of coronary artery fistula and family history. Patient stated about three months ago her PCP increased her amlodipine to 10 mg due to her elevated BP.  Informed patient that at her ER visit her troponin was negative, which was a good thing. Patient feels like something is just not right. Will forward to Dr. Eden Emms for advisement." Thus added to my schedule.   She was treated PPI therapy in the ER. Troponin negative.   Comes in today. Here with her husband. Multiple complaints. She notes she has basically felt bad since 2022/08/03. Bp not controlled and Norvasc was  increased. Dizzy. She notes more leg swelling - especially with increased Norvasc - does go down overnight. She feels more short of breath over the past month. She notes more difficulty with walking to her office from the parking lot. She is pretty sedentary for the most part while at work. Lots of stress. 1 week ago she started having chest pressure - this seems to come and go - can last for several hours - has had with exertion and at rest. EKG with poor R wave progression. She is having headaches. She feels like something is wrong. She does not really exercise on a regular basis. She did lose about 25 pounds with the HCG diet - this was not able to be sustained. Does endorse lots of stress with work/family. Father died back in 08-03-22.   No diabetes. Says lipids are good. FH + CAD. No smoking history. BP now controlled.   Past Medical History:  Diagnosis Date  . Coronary artery fistula   . GERD (gastroesophageal reflux disease)   . Hypertension   . Obesity     Past Surgical History:  Procedure Laterality Date  . ABDOMINAL HYSTERECTOMY    . ANKLE SURGERY Right 2004/2014  . CESAREAN SECTION    . OVARIAN CYST REMOVAL       Medications: Current Outpatient Prescriptions  Medication Sig Dispense Refill  . amLODipine (NORVASC) 10 MG tablet Take 10 mg by mouth  daily.    . buPROPion (WELLBUTRIN XL) 300 MG 24 hr tablet Take 300 mg by mouth daily.    . fluticasone (FLONASE) 50 MCG/ACT nasal spray Place 1 spray into both nostrils 2 (two) times daily.    Marland Kitchen levothyroxine (SYNTHROID, LEVOTHROID) 150 MCG tablet Take 150 mcg by mouth daily before breakfast.    . lisinopril-hydrochlorothiazide (PRINZIDE,ZESTORETIC) 20-25 MG per tablet Take 1 tablet by mouth daily.    Marland Kitchen loratadine (CLARITIN) 10 MG tablet Take 10 mg by mouth daily.    . magnesium 30 MG tablet Take 30 mg by mouth at bedtime.    Marland Kitchen thyroid (ARMOUR) 120 MG tablet Take 120 mg by mouth daily before breakfast.    . vitamin B-12  (CYANOCOBALAMIN) 100 MCG tablet Take 200 mcg by mouth daily.    . metoprolol tartrate (LOPRESSOR) 25 MG tablet Take 1 tablet (25 mg total) by mouth once. 1 tablet 3   No current facility-administered medications for this visit.     Allergies: Allergies  Allergen Reactions  . Codeine Itching    Social History: The patient  reports that she has never smoked. She has never used smokeless tobacco. She reports that she drinks about 6.0 oz of alcohol per week . She reports that she does not use drugs.   Family History: The patient's family history includes Cancer in her paternal grandmother.   Review of Systems: Please see the history of present illness.   Otherwise, the review of systems is positive for none.   All other systems are reviewed and negative.   Physical Exam: VS:  BP 128/80   Pulse 69   Ht 5\' 3"  (1.6 m)   Wt 231 lb 1.9 oz (104.8 kg)   SpO2 99% Comment: at rest  BMI 40.94 kg/m  .  BMI Body mass index is 40.94 kg/m.  Wt Readings from Last 3 Encounters:  04/06/16 231 lb 1.9 oz (104.8 kg)  01/25/13 241 lb 6.4 oz (109.5 kg)  12/31/12 238 lb (108 kg)    General: Pleasant. Obese female who is alert and in no acute distress. Little tearful.   HEENT: Normal.  Neck: Supple, no JVD, carotid bruits, or masses noted.  Cardiac: Regular rate and rhythm. Soft outflow murmur. No significant edema today.  Respiratory:  Lungs are clear to auscultation bilaterally with normal work of breathing.  GI: Soft and nontender.  MS: No deformity or atrophy. Gait and ROM intact.  Skin: Warm and dry. Color is normal.  Neuro:  Strength and sensation are intact and no gross focal deficits noted.  Psych: Alert, appropriate and with normal affect.   LABORATORY DATA:  EKG:  EKG is ordered today. This shows NSR with poor R wave progression.   Lab Results  Component Value Date   WBC 8.2 03/22/2016   HGB 14.2 03/22/2016   HCT 41.7 03/22/2016   PLT 241 03/22/2016   GLUCOSE 101 (H)  03/22/2016   NA 136 03/22/2016   K 3.4 (L) 03/22/2016   CL 101 03/22/2016   CREATININE 0.77 03/22/2016   BUN 14 03/22/2016   CO2 24 03/22/2016   TSH 2.110 12/31/2012    BNP (last 3 results) No results for input(s): BNP in the last 8760 hours.  ProBNP (last 3 results) No results for input(s): PROBNP in the last 8760 hours.   Other Studies Reviewed Today:  Echo Study Conclusions 12/2012  - Left ventricle: The cavity size was normal. There was mild focal basal hypertrophy of the septum.  Systolic function was normal. The estimated ejection fraction was in the range of 55% to 60%. Wall motion was normal; there were no regional wall motion abnormalities. Doppler parameters are consistent with abnormal left ventricular relaxation (grade 1 diastolic dysfunction). - Aortic valve: Trivial regurgitation. - Pulmonary arteries: PA peak pressure: 31mm Hg (S). Impressions:  - Elevated LVOT gradient of 2.5 m/s and mean gradient of 14 mmHg most likely from proximal septal thickening and narrow LVOT; aortic valve appears to open well.  Assessment/Plan: 1. Chest pain - risk factors include +FH, obesity and HTN.   2. Normal coronary anatomy from cath in 2012  3. Coronary fistula - not clear to me the significance - will get echo updated. Cardiac CT arranged after discussion with Dr. Eden EmmsNishan. Will ask Dr.McLean to read in Dr. Fabio BeringNishan's absence. I will have her take one dose of Lopressor 25 mg one hour prior to her CT scan. She tells me that she does not require sedation.   4. HTN - BP ok today  5. Leg swelling - will check echo - suspect this is related to high dose CCB therapy.   6. Hypokalemia - needs repeat lab today  7. Obesity  Current medicines are reviewed with the patient today.  The patient does not have concerns regarding medicines other than what has been noted above.  The following changes have been made:  See above.  Labs/ tests ordered today include:     Orders Placed This Encounter  Procedures  . CT Heart Morp W/Cta Cor W/Score W/Ca W/Cm &/Or Wo/Cm  . Basic metabolic panel  . EKG 12-Lead  . ECHOCARDIOGRAM COMPLETE     Disposition:   Further disposition pending. Will see how her studies turn out.    Patient is agreeable to this plan and will call if any problems develop in the interim.   Signed: Rosalio MacadamiaLori C. Fayette Gasner, RN, ANP-C 04/06/2016 9:18 AM  [facility] [address] [place], [place]  [address] Phone: [phone] Fax: [phone]

## 2016-04-06 NOTE — Patient Instructions (Addendum)
We will be checking the following labs today - BMET  Medication Instructions:    Continue with your current medicines.   You will take one dose of Lopressor one hour prior to your CT scan    Testing/Procedures To Be Arranged:  Cardiac CT  Echocardiogram  Follow-Up:   Will see how your study turns out and then decide about your follow up.     Other Special Instructions:   N/A    If you need a refill on your cardiac medications before your next appointment, please call your pharmacy.   Call the [facility] office at [phone] if you have any questions, problems or concerns.

## 2016-04-13 ENCOUNTER — Encounter: Payer: Self-pay | Admitting: Nurse Practitioner

## 2016-04-20 DIAGNOSIS — J302 Other seasonal allergic rhinitis: Secondary | ICD-10-CM | POA: Diagnosis not present

## 2016-04-20 DIAGNOSIS — I10 Essential (primary) hypertension: Secondary | ICD-10-CM | POA: Diagnosis not present

## 2016-04-26 ENCOUNTER — Other Ambulatory Visit: Payer: Self-pay

## 2016-04-26 ENCOUNTER — Ambulatory Visit (HOSPITAL_COMMUNITY)
Admission: RE | Admit: 2016-04-26 | Discharge: 2016-04-26 | Disposition: A | Discharge status: Home / Self Care | Payer: BLUE CROSS/BLUE SHIELD | Source: Ambulatory Visit | Attending: Nurse Practitioner | Admitting: Nurse Practitioner

## 2016-04-26 ENCOUNTER — Encounter (HOSPITAL_COMMUNITY): Payer: Self-pay

## 2016-04-26 ENCOUNTER — Ambulatory Visit (HOSPITAL_BASED_OUTPATIENT_CLINIC_OR_DEPARTMENT_OTHER): Payer: BLUE CROSS/BLUE SHIELD

## 2016-04-26 DIAGNOSIS — I253 Aneurysm of heart: Secondary | ICD-10-CM | POA: Diagnosis not present

## 2016-04-26 DIAGNOSIS — I10 Essential (primary) hypertension: Secondary | ICD-10-CM | POA: Diagnosis not present

## 2016-04-26 DIAGNOSIS — E785 Hyperlipidemia, unspecified: Secondary | ICD-10-CM | POA: Insufficient documentation

## 2016-04-26 DIAGNOSIS — R0789 Other chest pain: Secondary | ICD-10-CM

## 2016-04-26 DIAGNOSIS — R011 Cardiac murmur, unspecified: Secondary | ICD-10-CM | POA: Diagnosis not present

## 2016-04-26 DIAGNOSIS — R079 Chest pain, unspecified: Secondary | ICD-10-CM | POA: Diagnosis not present

## 2016-04-26 DIAGNOSIS — I2541 Coronary artery aneurysm: Secondary | ICD-10-CM

## 2016-04-26 MED ORDER — IOPAMIDOL (ISOVUE-370) INJECTION 76%
INTRAVENOUS | Status: AC
  Administered 2016-04-26: 80 mL
  Filled 2016-04-26: qty 100

## 2016-04-26 MED ORDER — NITROGLYCERIN 0.4 MG SL SUBL
SUBLINGUAL_TABLET | SUBLINGUAL | Status: AC
  Filled 2016-04-26: qty 1

## 2016-04-26 MED ORDER — METOPROLOL TARTRATE 5 MG/5ML IV SOLN
INTRAVENOUS | Status: AC
  Administered 2016-04-26: 5 mg via INTRAVENOUS
  Filled 2016-04-26: qty 15

## 2016-04-26 MED ORDER — METOPROLOL TARTRATE 5 MG/5ML IV SOLN
5.0000 mg | INTRAVENOUS | Status: DC | PRN
  Administered 2016-04-26: 5 mg via INTRAVENOUS

## 2016-04-26 NOTE — Progress Notes (Signed)
Shannon Reyes presented for an echocardiogram this morning. At the beginning of the exam, she mentioned that she has been having dizzy spells this past week. After the exam was completed, she sat up and experienced a bad dizzy spell. I checked her BP and it was 165/105 (electric cuff) and confirmed with a manual cuff and read 167/95. Patient said she has taken her blood pressure meds at 5am this morning. [name] was notified and she advised Mrs. Scaff to check her blood pressure throughout the day and keep a diary. Lawson FiscalLori will follow after results for echo and cardiac CT are completed.  Dewitt HoesBilly Labib Cwynar, [place] 04/26/2016

## 2016-05-03 DIAGNOSIS — R42 Dizziness and giddiness: Secondary | ICD-10-CM | POA: Diagnosis not present

## 2016-05-03 DIAGNOSIS — Z23 Encounter for immunization: Secondary | ICD-10-CM | POA: Diagnosis not present

## 2016-07-05 DIAGNOSIS — F331 Major depressive disorder, recurrent, moderate: Secondary | ICD-10-CM | POA: Diagnosis not present

## 2016-07-05 DIAGNOSIS — F4325 Adjustment disorder with mixed disturbance of emotions and conduct: Secondary | ICD-10-CM | POA: Diagnosis not present

## 2016-07-05 DIAGNOSIS — F411 Generalized anxiety disorder: Secondary | ICD-10-CM | POA: Diagnosis not present

## 2016-07-05 DIAGNOSIS — F902 Attention-deficit hyperactivity disorder, combined type: Secondary | ICD-10-CM | POA: Diagnosis not present

## 2016-07-06 DIAGNOSIS — Z79899 Other long term (current) drug therapy: Secondary | ICD-10-CM | POA: Diagnosis not present

## 2016-07-07 DIAGNOSIS — E039 Hypothyroidism, unspecified: Secondary | ICD-10-CM | POA: Diagnosis not present

## 2016-07-07 DIAGNOSIS — F339 Major depressive disorder, recurrent, unspecified: Secondary | ICD-10-CM | POA: Diagnosis not present

## 2016-07-07 DIAGNOSIS — I10 Essential (primary) hypertension: Secondary | ICD-10-CM | POA: Diagnosis not present

## 2016-07-21 DIAGNOSIS — I10 Essential (primary) hypertension: Secondary | ICD-10-CM | POA: Diagnosis not present

## 2016-07-21 DIAGNOSIS — J302 Other seasonal allergic rhinitis: Secondary | ICD-10-CM | POA: Diagnosis not present

## 2016-07-21 DIAGNOSIS — Z0000 Encounter for general adult medical examination without abnormal findings: Secondary | ICD-10-CM | POA: Diagnosis not present

## 2016-08-07 DIAGNOSIS — J069 Acute upper respiratory infection, unspecified: Secondary | ICD-10-CM | POA: Diagnosis not present

## 2016-08-07 DIAGNOSIS — R05 Cough: Secondary | ICD-10-CM | POA: Diagnosis not present

## 2016-10-19 DIAGNOSIS — J302 Other seasonal allergic rhinitis: Secondary | ICD-10-CM | POA: Diagnosis not present

## 2016-10-19 DIAGNOSIS — I10 Essential (primary) hypertension: Secondary | ICD-10-CM | POA: Diagnosis not present

## 2016-10-19 DIAGNOSIS — E039 Hypothyroidism, unspecified: Secondary | ICD-10-CM | POA: Diagnosis not present

## 2016-10-23 ENCOUNTER — Emergency Department (HOSPITAL_BASED_OUTPATIENT_CLINIC_OR_DEPARTMENT_OTHER)
Admission: EM | Admit: 2016-10-23 | Discharge: 2016-10-23 | Disposition: A | Discharge status: Home / Self Care | Payer: BLUE CROSS/BLUE SHIELD | Attending: Emergency Medicine | Admitting: Emergency Medicine

## 2016-10-23 ENCOUNTER — Encounter (HOSPITAL_BASED_OUTPATIENT_CLINIC_OR_DEPARTMENT_OTHER): Payer: Self-pay | Admitting: Emergency Medicine

## 2016-10-23 ENCOUNTER — Emergency Department (HOSPITAL_BASED_OUTPATIENT_CLINIC_OR_DEPARTMENT_OTHER): Payer: BLUE CROSS/BLUE SHIELD

## 2016-10-23 DIAGNOSIS — Z79899 Other long term (current) drug therapy: Secondary | ICD-10-CM | POA: Insufficient documentation

## 2016-10-23 DIAGNOSIS — Y999 Unspecified external cause status: Secondary | ICD-10-CM | POA: Insufficient documentation

## 2016-10-23 DIAGNOSIS — X500XXA Overexertion from strenuous movement or load, initial encounter: Secondary | ICD-10-CM | POA: Insufficient documentation

## 2016-10-23 DIAGNOSIS — Y929 Unspecified place or not applicable: Secondary | ICD-10-CM | POA: Insufficient documentation

## 2016-10-23 DIAGNOSIS — Y9389 Activity, other specified: Secondary | ICD-10-CM | POA: Insufficient documentation

## 2016-10-23 DIAGNOSIS — S6991XA Unspecified injury of right wrist, hand and finger(s), initial encounter: Secondary | ICD-10-CM | POA: Diagnosis not present

## 2016-10-23 DIAGNOSIS — I10 Essential (primary) hypertension: Secondary | ICD-10-CM | POA: Insufficient documentation

## 2016-10-23 DIAGNOSIS — M25531 Pain in right wrist: Secondary | ICD-10-CM | POA: Insufficient documentation

## 2016-10-23 HISTORY — DX: Disorder of thyroid, unspecified: E07.9

## 2016-10-23 NOTE — ED Notes (Signed)
ED Provider at bedside. 

## 2016-10-23 NOTE — ED Provider Notes (Signed)
[facility] Provider Note   CSN: [id number] Arrival date & time: 10/23/16  0908     History   Chief Complaint Chief Complaint  Patient presents with  . Wrist Pain    HPI Shannon Reyes is a 66 y.o. female who presents with right wrist pain x 2 days. Patient states she was working in her garden lifting heavy objects when she twisted her wrist and felt a popping sensation, followed by pain. She reports that pain worsened throughout the day. She describes the pain as a "throbbing ache" in states it as a 7/10. She reports mild improvement with the use of ibuprofen and a wrist splint. She denies any fall or trauma. She denies any numbness/weakness or any other concerns.   The history is provided by the patient.    Past Medical History:  Diagnosis Date  . Coronary artery fistula   . GERD (gastroesophageal reflux disease)   . Hypertension   . Obesity   . Thyroid disease     Patient Active Problem List   Diagnosis Date Noted  . GERD (gastroesophageal reflux disease) 01/25/2013  . HTN (hypertension) 12/31/2012  . Murmur 12/31/2012  . 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 Elevated LDL cholesterol level 12/31/2012  . Preop cardiovascular exam 12/31/2012  . Morbid obesity-BMI 41 12/06/2012    Past Surgical History:  Procedure Laterality Date  . ABDOMINAL HYSTERECTOMY    . ANKLE SURGERY Right 2004/2014  . CESAREAN SECTION    . OVARIAN CYST REMOVAL      OB History    No data available       Home Medications    Prior to Admission medications   Medication Sig Start Date End Date Taking? Authorizing Provider  amLODipine (NORVASC) 10 MG tablet Take 10 mg by mouth daily.   Yes [provider]  buPROPion (WELLBUTRIN XL) 300 MG 24 hr tablet Take 300 mg by mouth daily.   Yes [provider]  fluticasone (FLONASE) 50 MCG/ACT nasal spray Place 1 spray into both nostrils 2 (two) times daily.   Yes [provider]  levothyroxine (SYNTHROID,  LEVOTHROID) 150 MCG tablet Take 150 mcg by mouth daily before breakfast.   Yes [provider]  lisinopril-hydrochlorothiazide (PRINZIDE,ZESTORETIC) 20-25 MG per tablet Take 1 tablet by mouth daily.   Yes [provider]  loratadine (CLARITIN) 10 MG tablet Take 10 mg by mouth daily.    [provider]  magnesium 30 MG tablet Take 30 mg by mouth at bedtime.    [provider]  metoprolol tartrate (LOPRESSOR) 25 MG tablet Take 1 tablet (25 mg total) by mouth once. 04/06/16 04/06/16  Rosalio Macadamia, NP  thyroid (ARMOUR) 120 MG tablet Take 120 mg by mouth daily before breakfast.    [provider]  vitamin B-12 (CYANOCOBALAMIN) 100 MCG tablet Take 200 mcg by mouth daily.    [provider]    Family History Family History  Problem Relation Age of Onset  . Cancer Paternal Grandmother     Social History Social History  Substance Use Topics  . Smoking status: Never Smoker  . Smokeless tobacco: Never Used  . Alcohol use 6.0 oz/week    10 Glasses of wine per week     Allergies   Codeine   Review of Systems Review of Systems  Gastrointestinal: Negative for vomiting.  Musculoskeletal:       +Right wrist pain  All other systems reviewed and are negative.    Physical  Exam Updated Vital Signs BP (!) 126/91 (BP Location: Left Arm)   Pulse 69   Temp 98.1 F (36.7 C) (Oral)   Resp 18   Ht 5\' 3"  (1.6 m)   Wt 90.7 kg   SpO2 97%   BMI 35.43 kg/m   Physical Exam  Constitutional: She appears well-developed and well-nourished.  HENT:  Head: Normocephalic and atraumatic.  Eyes: Conjunctivae and EOM are normal. Right eye exhibits no discharge. Left eye exhibits no discharge. No scleral icterus.  Cardiovascular:  Pulses:      Radial pulses are 2+ on the right side, and 2+ on the left side.  Pulmonary/Chest: Effort normal.  Musculoskeletal: She exhibits no deformity.  Diffuse tenderness palpation to the radial aspect of right  wrist extending medially. Snuff box tenderness to right wrist. Full flexion/extension of the right wrist intact but with subjective reports of pain. Ulnar and radial deviation intact fully but with subjective reports of pain. No soft tissue swelling, deformity, crepitus or ecchymosis noted to right wrist. Abduction/adduction of digits 1 through 5 and opposition of thumb of right wrist fully intact. Left wrist normal.  Neurological: She is alert.  Sensation intact to digits one through 5 of right wrist and major nerve distributions of right forearm.   Skin: Skin is warm and dry. Capillary refill takes less than 2 seconds.  Psychiatric: She has a normal mood and affect. Her speech is normal and behavior is normal.     ED Treatments / Results  Labs (all labs ordered are listed, but only abnormal results are displayed) Labs Reviewed - No data to display  EKG  EKG Interpretation None       Radiology Dg Wrist Complete Right  Result Date: 10/23/2016 CLINICAL DATA:  66 year-old female injured her RIGHT wrist Friday while gardening. Pt states she was reaching and pulling when she felt the pain. Pt states she thinks she "pulled something" EXAM: RIGHT WRIST - COMPLETE 3+ VIEW COMPARISON:  None. FINDINGS: There is no evidence of fracture or dislocation. There is no evidence of arthropathy or other focal bone abnormality. Soft tissues are unremarkable. IMPRESSION: Negative. Electronically Signed   By: Bary Richard M.D.   On: 10/23/2016 09:42    Procedures Procedures (including critical care time)  Medications Ordered in ED Medications - No data to display   Initial Impression / Assessment and Plan / ED Course  I have reviewed the triage vital signs and the nursing notes.  Pertinent labs & imaging results that were available during my care of the patient were reviewed by me and considered in my medical decision making (see chart for details).    66 year old female who presents with right  wrist pain that began 2 days ago. Patient states that she was lifting heavy pots working in her garden and twisted her wrist and heard a pop. Patient reports that pain has been constant since then. Mild improvement with use of over-the-counter wrist splint and ibuprofen. Physical exam shows tenderness palpation to the radial aspect of right wrist with some slight snuffbox tenderness. No swelling, ecchymosis, deformity or crepitus noted. Patient is neurovascularly intact. Consider fracture versus sprain. Given lack of trauma/fall and history/physical exam, low suspicion for scaphoid fracture. X-rays ordered at triage. Patient offered analgesics in the department but she declined.  X-ray reviewed and negative for any dislocation or fracture. Discussed results with patient. Explained that this could be a ligamentous injury that will not show up on x-ray. We'll plan to splint in  thumb spica for stabilization/support. Instructed her to apply ice to the area and continue taking NSAIDs for pain relief. Will provide orthopedic referral and instructed her to follow-up with them in 2 weeks if no improvement in symptoms. Patient stable for discharge at this time. Strict return precautions given. Patient expresses understanding and agreement to plan.  Final Clinical Impressions(s) / ED Diagnoses   Final diagnoses:  Right wrist pain    New Prescriptions Discharge Medication List as of 10/23/2016 10:05 AM       Maxwell CaulLayden, Igor Bishop A, PA-C 10/23/16 1050    Alvira MondaySchlossman, Erin, MD 10/26/16 1303

## 2016-10-23 NOTE — ED Triage Notes (Signed)
R wrist pain since Friday after doing yard work. Pt denies specific injury. Pt has been wearing a brace with relief and taking motrin.

## 2016-10-23 NOTE — Discharge Instructions (Signed)
Wear splint for stabilization report.   Apply ice as needed to help with pain. You may apply and 20 minute intervals and take breaks and then reapply for 20 minutes.  If you have no improvement in your symptoms in 2 weeks, follow up with the referred orthopedic doctor.  Return the emergency Department for any worsening pain, swelling, breathing, numbness/weakness of the wrist or any other worsening or concerning symptoms.

## 2016-11-01 DIAGNOSIS — H5213 Myopia, bilateral: Secondary | ICD-10-CM | POA: Diagnosis not present

## 2016-11-01 DIAGNOSIS — H25043 Posterior subcapsular polar age-related cataract, bilateral: Secondary | ICD-10-CM | POA: Diagnosis not present

## 2016-12-09 DIAGNOSIS — E039 Hypothyroidism, unspecified: Secondary | ICD-10-CM | POA: Diagnosis not present

## 2016-12-09 DIAGNOSIS — I10 Essential (primary) hypertension: Secondary | ICD-10-CM | POA: Diagnosis not present

## 2016-12-12 DIAGNOSIS — I10 Essential (primary) hypertension: Secondary | ICD-10-CM | POA: Diagnosis not present

## 2016-12-12 DIAGNOSIS — K219 Gastro-esophageal reflux disease without esophagitis: Secondary | ICD-10-CM | POA: Diagnosis not present

## 2016-12-12 DIAGNOSIS — F339 Major depressive disorder, recurrent, unspecified: Secondary | ICD-10-CM | POA: Diagnosis not present

## 2016-12-12 DIAGNOSIS — E039 Hypothyroidism, unspecified: Secondary | ICD-10-CM | POA: Diagnosis not present

## 2016-12-20 ENCOUNTER — Encounter (HOSPITAL_BASED_OUTPATIENT_CLINIC_OR_DEPARTMENT_OTHER): Payer: Self-pay | Admitting: Emergency Medicine

## 2016-12-20 ENCOUNTER — Emergency Department (HOSPITAL_BASED_OUTPATIENT_CLINIC_OR_DEPARTMENT_OTHER)
Admission: EM | Admit: 2016-12-20 | Discharge: 2016-12-20 | Disposition: A | Discharge status: Home / Self Care | Payer: BLUE CROSS/BLUE SHIELD | Attending: Emergency Medicine | Admitting: Emergency Medicine

## 2016-12-20 ENCOUNTER — Emergency Department (HOSPITAL_BASED_OUTPATIENT_CLINIC_OR_DEPARTMENT_OTHER): Payer: BLUE CROSS/BLUE SHIELD

## 2016-12-20 DIAGNOSIS — Z79899 Other long term (current) drug therapy: Secondary | ICD-10-CM | POA: Diagnosis not present

## 2016-12-20 DIAGNOSIS — K5710 Diverticulosis of small intestine without perforation or abscess without bleeding: Secondary | ICD-10-CM | POA: Diagnosis not present

## 2016-12-20 DIAGNOSIS — I10 Essential (primary) hypertension: Secondary | ICD-10-CM | POA: Insufficient documentation

## 2016-12-20 DIAGNOSIS — R1032 Left lower quadrant pain: Secondary | ICD-10-CM

## 2016-12-20 DIAGNOSIS — R110 Nausea: Secondary | ICD-10-CM

## 2016-12-20 DIAGNOSIS — K5792 Diverticulitis of intestine, part unspecified, without perforation or abscess without bleeding: Secondary | ICD-10-CM | POA: Insufficient documentation

## 2016-12-20 LAB — URINALYSIS, ROUTINE W REFLEX MICROSCOPIC
BILIRUBIN URINE: NEGATIVE
Glucose, UA: NEGATIVE mg/dL
KETONES UR: 15 mg/dL — AB
Leukocytes, UA: NEGATIVE
Nitrite: NEGATIVE
PROTEIN: NEGATIVE mg/dL
Specific Gravity, Urine: 1.009 (ref 1.005–1.030)
pH: 7 (ref 5.0–8.0)

## 2016-12-20 LAB — CBC
HEMATOCRIT: 38.5 % (ref 36.0–46.0)
Hemoglobin: 12.8 g/dL (ref 12.0–15.0)
MCH: 30.4 pg (ref 26.0–34.0)
MCHC: 33.2 g/dL (ref 30.0–36.0)
MCV: 91.4 fL (ref 78.0–100.0)
Platelets: 264 10*3/uL (ref 150–400)
RBC: 4.21 MIL/uL (ref 3.87–5.11)
RDW: 15.4 % (ref 11.5–15.5)
WBC: 13.5 10*3/uL — ABNORMAL HIGH (ref 4.0–10.5)

## 2016-12-20 LAB — COMPREHENSIVE METABOLIC PANEL
ALBUMIN: 3.5 g/dL (ref 3.5–5.0)
ALT: 30 U/L (ref 14–54)
AST: 17 U/L (ref 15–41)
Alkaline Phosphatase: 50 U/L (ref 38–126)
Anion gap: 10 (ref 5–15)
BUN: 8 mg/dL (ref 6–20)
CHLORIDE: 100 mmol/L — AB (ref 101–111)
CO2: 28 mmol/L (ref 22–32)
Calcium: 8.9 mg/dL (ref 8.9–10.3)
Creatinine, Ser: 0.64 mg/dL (ref 0.44–1.00)
GFR calc Af Amer: >60 mL/min (ref >60)
GFR calc non Af Amer: >60 mL/min (ref >60)
GLUCOSE: 106 mg/dL — AB (ref 65–99)
POTASSIUM: 3.3 mmol/L — AB (ref 3.5–5.1)
SODIUM: 138 mmol/L (ref 135–145)
Total Bilirubin: 0.4 mg/dL (ref 0.3–1.2)
Total Protein: 7 g/dL (ref 6.5–8.1)

## 2016-12-20 LAB — URINALYSIS, MICROSCOPIC (REFLEX)

## 2016-12-20 LAB — I-STAT CG4 LACTIC ACID, ED: LACTIC ACID, VENOUS: 0.89 mmol/L (ref 0.5–1.9)

## 2016-12-20 LAB — LIPASE, BLOOD: LIPASE: 21 U/L (ref 11–51)

## 2016-12-20 MED ORDER — IOPAMIDOL (ISOVUE-300) INJECTION 61%
100.0000 mL | Freq: Once | INTRAVENOUS | Status: AC | PRN
  Administered 2016-12-20: 100 mL via INTRAVENOUS

## 2016-12-20 MED ORDER — DEXTROSE 5 % IV SOLN
2.0000 g | Freq: Once | INTRAVENOUS | Status: AC
  Administered 2016-12-20: 2 g via INTRAVENOUS
  Filled 2016-12-20: qty 2

## 2016-12-20 MED ORDER — CIPROFLOXACIN HCL 500 MG PO TABS: 500.0000 mg | ORAL_TABLET | Freq: Two times a day (BID) | ORAL | 0 refills | Status: AC

## 2016-12-20 MED ORDER — METRONIDAZOLE IN NACL 5-0.79 MG/ML-% IV SOLN
500.0000 mg | Freq: Once | INTRAVENOUS | Status: AC
  Administered 2016-12-20: 500 mg via INTRAVENOUS

## 2016-12-20 MED ORDER — MORPHINE SULFATE (PF) 4 MG/ML IV SOLN
4.0000 mg | Freq: Once | INTRAVENOUS | Status: AC
  Administered 2016-12-20: 4 mg via INTRAVENOUS
  Filled 2016-12-20: qty 1

## 2016-12-20 MED ORDER — ONDANSETRON HCL 4 MG PO TABS: 4.0000 mg | ORAL_TABLET | Freq: Four times a day (QID) | ORAL | 0 refills | Status: DC | PRN

## 2016-12-20 MED ORDER — SODIUM CHLORIDE 0.9 % IV BOLUS (SEPSIS)
1000.0000 mL | Freq: Once | INTRAVENOUS | Status: AC
Start: 2016-12-20 — End: 2016-12-20
  Administered 2016-12-20: 1000 mL via INTRAVENOUS

## 2016-12-20 MED ORDER — SODIUM CHLORIDE 0.9 % IV BOLUS (SEPSIS)
1000.0000 mL | Freq: Once | INTRAVENOUS | Status: AC
  Administered 2016-12-20: 1000 mL via INTRAVENOUS

## 2016-12-20 MED ORDER — METRONIDAZOLE 500 MG PO TABS: 500.0000 mg | ORAL_TABLET | Freq: Three times a day (TID) | ORAL | 0 refills | Status: AC

## 2016-12-20 MED ORDER — OXYCODONE-ACETAMINOPHEN 5-325 MG PO TABS: 1.0000 | ORAL_TABLET | ORAL | 0 refills | Status: DC | PRN

## 2016-12-20 MED ORDER — ACETAMINOPHEN 325 MG PO TABS
650.0000 mg | ORAL_TABLET | Freq: Once | ORAL | Status: AC
  Administered 2016-12-20: 650 mg via ORAL
  Filled 2016-12-20: qty 2

## 2016-12-20 MED ORDER — ONDANSETRON HCL 4 MG/2ML IJ SOLN
4.0000 mg | Freq: Once | INTRAMUSCULAR | Status: AC
  Administered 2016-12-20: 4 mg via INTRAVENOUS
  Filled 2016-12-20: qty 2

## 2016-12-20 NOTE — Discharge Instructions (Signed)
Your workup today shows evidence of diverticulitis. We gave you 1 dose of antibiotics here. As you able to tolerate fluids and your symptoms came under better control, we discussed and decided on a trial of outpatient management. Please take the antibiotics as prescribed for the next 10 days and use the nausea and pain medicine to help your symptoms. Please stay hydrated. Please call and schedule a follow-up appointment with her primary care physician in the next several days to ensure improving symptoms. If any symptoms change or worsen or you were unable to tolerate eating and drinking, please return immediately to the nearest emergency department for further management.

## 2016-12-20 NOTE — ED Triage Notes (Signed)
Lower abd pain x 3 days with nausea, occasional loose stools. Pt reports it feels like gas pain.

## 2016-12-20 NOTE — ED Provider Notes (Signed)
[facility]   CSN: [id number] Arrival date & time: 12/20/16  [id number]     History   Chief Complaint Chief Complaint  Patient presents with  . Abdominal Pain    HPI Shannon Reyes is a 66 y.o. female.  The history is provided by the patient and medical records. No language interpreter was used.  Abdominal Pain   This is a new problem. The current episode started more than 2 days ago. The problem occurs constantly. The problem has been gradually worsening. The pain is associated with an unknown factor. The pain is located in the LLQ. The quality of the pain is aching, cramping, sharp and pressure-like. The pain is at a severity of 6/10. The pain is moderate. Associated symptoms include diarrhea, nausea and constipation. Pertinent negatives include fever, flatus, vomiting, dysuria, frequency, hematuria and headaches. The symptoms are aggravated by palpation. Nothing relieves the symptoms. Her past medical history is significant for GERD.    Past Medical History:  Diagnosis Date  . Coronary artery fistula   . GERD (gastroesophageal reflux disease)   . Hypertension   . Obesity   . Thyroid disease     Patient Active Problem List   Diagnosis Date Noted  . GERD (gastroesophageal reflux disease) 01/25/2013  . HTN (hypertension) 12/31/2012  . Murmur 12/31/2012  . 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 Elevated LDL cholesterol level 12/31/2012  . Preop cardiovascular exam 12/31/2012  . Morbid obesity-BMI 41 12/06/2012    Past Surgical History:  Procedure Laterality Date  . ABDOMINAL HYSTERECTOMY    . ANKLE SURGERY Right 2004/2014  . CESAREAN SECTION    . OVARIAN CYST REMOVAL      OB History    No data available       Home Medications    Prior to Admission medications   Medication Sig Start Date End Date Taking? Authorizing Provider  lisinopril (PRINIVIL,ZESTRIL) 20 MG tablet Take 20 mg by mouth daily.   Yes [provider]  amLODipine  (NORVASC) 10 MG tablet Take 10 mg by mouth daily.    [provider]  buPROPion (WELLBUTRIN XL) 300 MG 24 hr tablet Take 300 mg by mouth daily.    [provider]  fluticasone (FLONASE) 50 MCG/ACT nasal spray Place 1 spray into both nostrils 2 (two) times daily.    [provider]  levothyroxine (SYNTHROID, LEVOTHROID) 150 MCG tablet Take 150 mcg by mouth daily before breakfast.    [provider]  lisinopril-hydrochlorothiazide (PRINZIDE,ZESTORETIC) 20-25 MG per tablet Take 1 tablet by mouth daily.    [provider]  loratadine (CLARITIN) 10 MG tablet Take 10 mg by mouth daily.    [provider]  magnesium 30 MG tablet Take 30 mg by mouth at bedtime.    [provider]  metoprolol tartrate (LOPRESSOR) 25 MG tablet Take 1 tablet (25 mg total) by mouth once. 04/06/16 04/06/16  Rosalio MacadamiaGerhardt, Lori C, NP  thyroid (ARMOUR) 120 MG tablet Take 120 mg by mouth daily before breakfast.    [provider]  vitamin B-12 (CYANOCOBALAMIN) 100 MCG tablet Take 200 mcg by mouth daily.    [provider]    Family History Family History  Problem Relation Age of Onset  . Cancer Paternal Grandmother     Social History Social History  Substance Use Topics  . Smoking status: Never Smoker  . Smokeless tobacco: Never Used  . Alcohol use 6.0 oz/week    10 Glasses of wine  per week     Allergies   Codeine   Review of Systems Review of Systems  Constitutional: Negative for chills, diaphoresis, fatigue and fever.  HENT: Negative for congestion and rhinorrhea.   Eyes: Negative for visual disturbance.  Respiratory: Negative for cough, chest tightness, shortness of breath and wheezing.   Cardiovascular: Negative for chest pain.  Gastrointestinal: Positive for abdominal pain, constipation, diarrhea and nausea. Negative for abdominal distention, blood in stool, flatus and vomiting.  Genitourinary: Negative for dysuria, flank pain,  frequency and hematuria.  Musculoskeletal: Negative for back pain and neck pain.  Skin: Negative for rash and wound.  Neurological: Negative for light-headedness, numbness and headaches.  Psychiatric/Behavioral: Negative for agitation.  All other systems reviewed and are negative.    Physical Exam Updated Vital Signs BP (!) 153/90 (BP Location: Right Arm)   Pulse 90   Temp 98.5 F (36.9 C) (Oral)   Resp 20   Wt 102.1 kg (225 lb)   SpO2 98%   BMI 39.86 kg/m   Physical Exam  Constitutional: She is oriented to person, place, and time. She appears well-developed and well-nourished. No distress.  HENT:  Head: Normocephalic and atraumatic.  Right Ear: External ear normal.  Left Ear: External ear normal.  Nose: Nose normal.  Mouth/Throat: Oropharynx is clear and moist. No oropharyngeal exudate.  Eyes: Conjunctivae and EOM are normal. Pupils are equal, round, and reactive to light.  Neck: Normal range of motion. Neck supple.  Cardiovascular: Normal rate and normal heart sounds.   No murmur heard. Pulmonary/Chest: Effort normal. No stridor. No respiratory distress. She has no wheezes. She exhibits no tenderness.  Abdominal: Normal appearance and bowel sounds are normal. She exhibits no distension. There is generalized tenderness and tenderness in the left lower quadrant. There is no rigidity, no rebound, no guarding and no CVA tenderness.    Musculoskeletal: She exhibits no tenderness.  Neurological: She is alert and oriented to person, place, and time. She has normal reflexes. No cranial nerve deficit or sensory deficit. She exhibits normal muscle tone. Coordination normal.  Skin: Skin is warm. Capillary refill takes less than 2 seconds. No rash noted. She is not diaphoretic. No erythema.  Psychiatric: She has a normal mood and affect.  Nursing note and vitals reviewed.    ED Treatments / Results  Labs (all labs ordered are listed, but only abnormal results are displayed) Labs  Reviewed  COMPREHENSIVE METABOLIC PANEL - Abnormal; Notable for the following:       Result Value   Potassium 3.3 (*)    Chloride 100 (*)    Glucose, Bld 106 (*)    All other components within normal limits  CBC - Abnormal; Notable for the following:    WBC 13.5 (*)    All other components within normal limits  URINALYSIS, ROUTINE W REFLEX MICROSCOPIC - Abnormal; Notable for the following:    Hgb urine dipstick TRACE (*)    Ketones, ur 15 (*)    All other components within normal limits  URINALYSIS, MICROSCOPIC (REFLEX) - Abnormal; Notable for the following:    Bacteria, UA MANY (*)    Squamous Epithelial / LPF 0-5 (*)    All other components within normal limits  LIPASE, BLOOD  I-STAT CG4 LACTIC ACID, ED    EKG  EKG Interpretation None       Radiology Ct Abdomen Pelvis W Contrast  Result Date: 12/20/2016 CLINICAL DATA:  Left lower quadrant pain with decreased flatus, 2-3 days. History of  diverticulitis with similar symptoms. History of hypertension, GERD. EXAM: CT ABDOMEN AND PELVIS WITH CONTRAST TECHNIQUE: Multidetector CT imaging of the abdomen and pelvis was performed using the standard protocol following bolus administration of intravenous contrast. CONTRAST:  ISOVUE-300 IOPAMIDOL (ISOVUE-300) INJECTION 61% COMPARISON:  None. FINDINGS: Lower chest: No acute abnormality. Hepatobiliary: No focal liver abnormality is seen. No gallstones, gallbladder wall thickening, or biliary dilatation. Pancreas: Unremarkable. No pancreatic ductal dilatation or surrounding inflammatory changes. Spleen: Normal in size without focal abnormality. Adrenals/Urinary Tract: Adrenal glands appear normal. Kidneys appear normal without mass, stone or hydronephrosis. No ureteral or bladder calculi identified. Bladder is partially decompressed. Stomach/Bowel: Thickening of the walls of the sigmoid colon indicating acute diverticulitis, with associated prominent pericolonic inflammation/fluid stranding.  No pericolonic abscess collection or free intraperitoneal air seen. No dilated large or small bowel loops. Vascular/Lymphatic: Mildly prominent lymph nodes in the left hemipelvis and periaortic region, likely reactive in nature. Mild atherosclerosis of the abdominal aorta. Reproductive: Status post hysterectomy. No adnexal masses. Other: No abscess collection seen.  No free intraperitoneal air. Musculoskeletal: Mild degenerative spurring within the thoracic and lumbar spine. No acute or suspicious osseous finding. IMPRESSION: 1. Findings are consistent with acute diverticulitis of the sigmoid colon, with associated bowel wall thickening and associated prominent pericolonic inflammation/fluid stranding in the left hemipelvis and left lower quadrant. No abscess collection seen. No free intraperitoneal air. No bowel obstruction. 2. Of note, there is loss of the normal fat plane between the inflamed sigmoid colon and the adjacent bladder (with associated thickening of the upper bladder walls) which may indicate secondary involvement of the bladder wall and may predispose the patient to development of enterovesical fistula. Recommend close clinical follow-up. 3. Aortic atherosclerosis. Electronically Signed   By: Bary Richard M.D.   On: 12/20/2016 12:53    Procedures Procedures (including critical care time)  Medications Ordered in ED Medications  sodium chloride 0.9 % bolus 1,000 mL (0 mLs Intravenous Stopped 12/20/16 1432)  ondansetron (ZOFRAN) injection 4 mg (4 mg Intravenous Given 12/20/16 1011)  morphine 4 MG/ML injection 4 mg (4 mg Intravenous Given 12/20/16 1011)  iopamidol (ISOVUE-300) 61 % injection 100 mL (100 mLs Intravenous Contrast Given 12/20/16 1208)  morphine 4 MG/ML injection 4 mg (4 mg Intravenous Given 12/20/16 1317)  cefTRIAXone (ROCEPHIN) 2 g in dextrose 5 % 50 mL IVPB (0 g Intravenous Stopped 12/20/16 1437)    And  metroNIDAZOLE (FLAGYL) IVPB 500 mg (0 mg Intravenous Stopped 12/20/16 1618)    sodium chloride 0.9 % bolus 1,000 mL (0 mLs Intravenous Stopped 12/20/16 1622)  acetaminophen (TYLENOL) tablet 650 mg (650 mg Oral Given 12/20/16 1548)     Initial Impression / Assessment and Plan / ED Course  I have reviewed the triage vital signs and the nursing notes.  Pertinent labs & imaging results that were available during my care of the patient were reviewed by me and considered in my medical decision making (see chart for details).     Shannon Reyes is a 66 y.o. female with a past medical history significant for hypertension, GERD, and diverticulitis who presents with abdominal pain, nausea, diarrhea, decreased oral intake, chills and decreased flatus. Patient says that her symptoms feel similar to her last episode of diverticulitis years ago. Patient describes over the last 3 days developing diffuse abdominal pain that is worse in the left lower quadrant. She says that she has had decrease in her bowel movements and feels constipated but has had several, minimal episodes of  very loose stools. She denies rectal bleeding. She describes her pain as a 6 out of 10 severity, cramping and twisting in the left lower quadrant. She denies any episodes of emesis but reports constant nausea. She has had decreased oral intake over the last few days. She denies any recent abdominal trauma. She says that she has had decrease in her passage of flatus and feels "gas pain". She denies fevers but reports some chills. She denies any urinary symptoms, and denies any upper respiratory symptoms. She denies any chest pain, shortness of breath, palpitations, or fatigue.  History and exam are seen above. On exam, patient's left abdomen is tender to palpation. Patient also has tenderness across her abdomen. No CVA tenderness. Lungs are clear. No focal neurologic deficits. No significant lower extremity edema. No other significant abnormalities on exam. Next  Given the location of her abdominal pain and her history of  diverticulitis feeling similar, that is the primary concern. Patient will be given pain medicine, nausea medicine and fluids. Patient will have laboratory testing, urinalysis, and CT scan to look for obstruction or severe diverticulitis.  Anticipate reassessment following workup.  4:33 PM Diagnostic testing results show acute diverticulitis. Mild leukocytosis. No evidence of abscess or perforation seen on CT. No bowel obstruction. There was evidence of inflammation near the bladder that imaging was concerning for predisposition for fistula however, patient denied urinary symptoms at this time. Next  Patient given IV antibiotics, nausea medicine, and pain medicine. Patient also given fluids.  Patient reports feeling better after medications. Patient wants to try outpatient management for diverticulitis. Patient advised that outpatient management is reasonable as there is no abscess or perforation however, she was also given extremely strict return precautions for any worsening symptoms or intolerance of by mouth. Patient continues to want to try outpatient manager. Patient will be given prescription for pain medicine, nausea medicine, and antibiotics. Patient will follow-up with her PCP in the next several days. If patient begins having any worsening symptoms, she'll return to the ED for further management and likely admission.  Patient understood plan of care and had no other questions or concerns. Patient discharged in good condition.   Final Clinical Impressions(s) / ED Diagnoses   Final diagnoses:  Left lower quadrant pain  Diverticulitis  Nausea    New Prescriptions New Prescriptions   CIPROFLOXACIN (CIPRO) 500 MG TABLET    Take 1 tablet (500 mg total) by mouth every 12 (twelve) hours.   METRONIDAZOLE (FLAGYL) 500 MG TABLET    Take 1 tablet (500 mg total) by mouth 3 (three) times daily.   ONDANSETRON (ZOFRAN) 4 MG TABLET    Take 1 tablet (4 mg total) by mouth every 6 (six) hours as  needed for nausea or vomiting.   OXYCODONE-ACETAMINOPHEN (PERCOCET/ROXICET) 5-325 MG TABLET    Take 1 tablet by mouth every 4 (four) hours as needed for severe pain.    Clinical Impression: 1. Left lower quadrant pain   2. Diverticulitis   3. Nausea     Disposition: Discharge  Condition: Good  I have discussed the results, Dx and Tx plan with the pt(& family if present). He/she/they expressed understanding and agree(s) with the plan. Discharge instructions discussed at great length. Strict return precautions discussed and pt &/or family have verbalized understanding of the instructions. No further questions at time of discharge.    New Prescriptions   CIPROFLOXACIN (CIPRO) 500 MG TABLET    Take 1 tablet (500 mg total) by mouth every 12 (twelve)  hours.   METRONIDAZOLE (FLAGYL) 500 MG TABLET    Take 1 tablet (500 mg total) by mouth 3 (three) times daily.   ONDANSETRON (ZOFRAN) 4 MG TABLET    Take 1 tablet (4 mg total) by mouth every 6 (six) hours as needed for nausea or vomiting.   OXYCODONE-ACETAMINOPHEN (PERCOCET/ROXICET) 5-325 MG TABLET    Take 1 tablet by mouth every 4 (four) hours as needed for severe pain.    Follow Up: Gweneth Dimitri, MD [address] [place] [address] [phone]  Schedule an appointment as soon as possible for a visit    [facility] [place] [facility] [address] [id number] mc [address] [place] [address] [phone]  If symptoms worsen     Tegeler, Canary Brim, MD 12/20/16 1719

## 2016-12-20 NOTE — ED Notes (Signed)
ED Provider at bedside. 

## 2017-01-02 ENCOUNTER — Other Ambulatory Visit: Payer: Self-pay | Admitting: Family Medicine

## 2017-01-02 ENCOUNTER — Other Ambulatory Visit: Payer: Self-pay | Admitting: Physician Assistant

## 2017-01-02 DIAGNOSIS — Z0000 Encounter for general adult medical examination without abnormal findings: Secondary | ICD-10-CM

## 2017-01-02 DIAGNOSIS — K5792 Diverticulitis of intestine, part unspecified, without perforation or abscess without bleeding: Secondary | ICD-10-CM | POA: Diagnosis not present

## 2017-01-02 DIAGNOSIS — R197 Diarrhea, unspecified: Secondary | ICD-10-CM | POA: Diagnosis not present

## 2017-01-02 DIAGNOSIS — R102 Pelvic and perineal pain: Secondary | ICD-10-CM | POA: Diagnosis not present

## 2017-01-02 DIAGNOSIS — N322 Vesical fistula, not elsewhere classified: Secondary | ICD-10-CM

## 2017-01-02 DIAGNOSIS — R1032 Left lower quadrant pain: Secondary | ICD-10-CM

## 2017-01-03 ENCOUNTER — Ambulatory Visit
Admission: RE | Admit: 2017-01-03 | Discharge: 2017-01-03 | Disposition: A | Discharge status: Home / Self Care | Payer: BLUE CROSS/BLUE SHIELD | Source: Ambulatory Visit | Attending: Physician Assistant | Admitting: Physician Assistant

## 2017-01-03 DIAGNOSIS — K5792 Diverticulitis of intestine, part unspecified, without perforation or abscess without bleeding: Secondary | ICD-10-CM | POA: Diagnosis not present

## 2017-01-03 DIAGNOSIS — R1032 Left lower quadrant pain: Secondary | ICD-10-CM

## 2017-01-03 DIAGNOSIS — N322 Vesical fistula, not elsewhere classified: Secondary | ICD-10-CM

## 2017-01-03 MED ORDER — IOPAMIDOL (ISOVUE-300) INJECTION 61%
100.0000 mL | Freq: Once | INTRAVENOUS | Status: AC | PRN
  Administered 2017-01-03: 100 mL via INTRAVENOUS

## 2017-01-05 DIAGNOSIS — K5792 Diverticulitis of intestine, part unspecified, without perforation or abscess without bleeding: Secondary | ICD-10-CM | POA: Diagnosis not present

## 2017-01-13 ENCOUNTER — Ambulatory Visit: Payer: BLUE CROSS/BLUE SHIELD

## 2017-01-16 DIAGNOSIS — K5900 Constipation, unspecified: Secondary | ICD-10-CM | POA: Diagnosis not present

## 2017-01-16 DIAGNOSIS — K5792 Diverticulitis of intestine, part unspecified, without perforation or abscess without bleeding: Secondary | ICD-10-CM | POA: Diagnosis not present

## 2017-01-16 DIAGNOSIS — R1032 Left lower quadrant pain: Secondary | ICD-10-CM | POA: Diagnosis not present

## 2017-01-17 ENCOUNTER — Ambulatory Visit: Payer: BLUE CROSS/BLUE SHIELD

## 2017-01-19 DIAGNOSIS — F331 Major depressive disorder, recurrent, moderate: Secondary | ICD-10-CM | POA: Diagnosis not present

## 2017-01-19 DIAGNOSIS — F411 Generalized anxiety disorder: Secondary | ICD-10-CM | POA: Diagnosis not present

## 2017-01-19 DIAGNOSIS — F4325 Adjustment disorder with mixed disturbance of emotions and conduct: Secondary | ICD-10-CM | POA: Diagnosis not present

## 2017-01-19 DIAGNOSIS — F902 Attention-deficit hyperactivity disorder, combined type: Secondary | ICD-10-CM | POA: Diagnosis not present

## 2017-01-20 ENCOUNTER — Ambulatory Visit: Payer: BLUE CROSS/BLUE SHIELD

## 2017-01-23 ENCOUNTER — Ambulatory Visit: Payer: BLUE CROSS/BLUE SHIELD

## 2017-01-30 DIAGNOSIS — I10 Essential (primary) hypertension: Secondary | ICD-10-CM | POA: Diagnosis not present

## 2017-01-30 DIAGNOSIS — Z0000 Encounter for general adult medical examination without abnormal findings: Secondary | ICD-10-CM | POA: Diagnosis not present

## 2017-01-30 DIAGNOSIS — D473 Essential (hemorrhagic) thrombocythemia: Secondary | ICD-10-CM | POA: Diagnosis not present

## 2017-02-22 DIAGNOSIS — I10 Essential (primary) hypertension: Secondary | ICD-10-CM | POA: Diagnosis not present

## 2017-02-22 DIAGNOSIS — J302 Other seasonal allergic rhinitis: Secondary | ICD-10-CM | POA: Diagnosis not present

## 2017-03-09 ENCOUNTER — Ambulatory Visit
Admission: RE | Admit: 2017-03-09 | Discharge: 2017-03-09 | Disposition: A | Discharge status: Home / Self Care | Payer: BLUE CROSS/BLUE SHIELD | Source: Ambulatory Visit | Attending: Family Medicine | Admitting: Family Medicine

## 2017-03-09 DIAGNOSIS — Z0000 Encounter for general adult medical examination without abnormal findings: Secondary | ICD-10-CM

## 2017-03-09 DIAGNOSIS — E2839 Other primary ovarian failure: Secondary | ICD-10-CM | POA: Diagnosis not present

## 2017-03-09 DIAGNOSIS — Z1231 Encounter for screening mammogram for malignant neoplasm of breast: Secondary | ICD-10-CM | POA: Diagnosis not present

## 2017-04-17 DIAGNOSIS — H25042 Posterior subcapsular polar age-related cataract, left eye: Secondary | ICD-10-CM | POA: Diagnosis not present

## 2017-05-10 DIAGNOSIS — H25042 Posterior subcapsular polar age-related cataract, left eye: Secondary | ICD-10-CM | POA: Diagnosis not present

## 2017-05-10 DIAGNOSIS — H2512 Age-related nuclear cataract, left eye: Secondary | ICD-10-CM | POA: Diagnosis not present

## 2017-05-10 DIAGNOSIS — H25812 Combined forms of age-related cataract, left eye: Secondary | ICD-10-CM | POA: Diagnosis not present

## 2017-06-01 DIAGNOSIS — F331 Major depressive disorder, recurrent, moderate: Secondary | ICD-10-CM | POA: Diagnosis not present

## 2017-06-01 DIAGNOSIS — F902 Attention-deficit hyperactivity disorder, combined type: Secondary | ICD-10-CM | POA: Diagnosis not present

## 2017-06-01 DIAGNOSIS — F4325 Adjustment disorder with mixed disturbance of emotions and conduct: Secondary | ICD-10-CM | POA: Diagnosis not present

## 2017-06-01 DIAGNOSIS — F411 Generalized anxiety disorder: Secondary | ICD-10-CM | POA: Diagnosis not present

## 2017-06-02 DIAGNOSIS — Z23 Encounter for immunization: Secondary | ICD-10-CM | POA: Diagnosis not present

## 2017-06-16 ENCOUNTER — Encounter (HOSPITAL_BASED_OUTPATIENT_CLINIC_OR_DEPARTMENT_OTHER): Payer: Self-pay | Admitting: Emergency Medicine

## 2017-06-16 ENCOUNTER — Other Ambulatory Visit: Payer: Self-pay

## 2017-06-16 ENCOUNTER — Emergency Department (HOSPITAL_BASED_OUTPATIENT_CLINIC_OR_DEPARTMENT_OTHER): Payer: BLUE CROSS/BLUE SHIELD

## 2017-06-16 ENCOUNTER — Emergency Department (HOSPITAL_BASED_OUTPATIENT_CLINIC_OR_DEPARTMENT_OTHER)
Admission: EM | Admit: 2017-06-16 | Discharge: 2017-06-16 | Disposition: A | Discharge status: Home / Self Care | Payer: BLUE CROSS/BLUE SHIELD | Attending: Emergency Medicine | Admitting: Emergency Medicine

## 2017-06-16 DIAGNOSIS — I10 Essential (primary) hypertension: Secondary | ICD-10-CM | POA: Diagnosis not present

## 2017-06-16 DIAGNOSIS — I2510 Atherosclerotic heart disease of native coronary artery without angina pectoris: Secondary | ICD-10-CM | POA: Diagnosis not present

## 2017-06-16 DIAGNOSIS — R1033 Periumbilical pain: Secondary | ICD-10-CM | POA: Diagnosis not present

## 2017-06-16 DIAGNOSIS — R1032 Left lower quadrant pain: Secondary | ICD-10-CM | POA: Diagnosis not present

## 2017-06-16 DIAGNOSIS — R1012 Left upper quadrant pain: Secondary | ICD-10-CM | POA: Diagnosis not present

## 2017-06-16 DIAGNOSIS — R109 Unspecified abdominal pain: Secondary | ICD-10-CM | POA: Diagnosis not present

## 2017-06-16 HISTORY — DX: Diverticulosis of intestine, part unspecified, without perforation or abscess without bleeding: K57.90

## 2017-06-16 HISTORY — DX: Diverticulitis of intestine, part unspecified, without perforation or abscess without bleeding: K57.92

## 2017-06-16 LAB — CBC WITH DIFFERENTIAL/PLATELET
BASOS PCT: 0 %
Basophils Absolute: 0 10*3/uL (ref 0.0–0.1)
EOS PCT: 2 %
Eosinophils Absolute: 0.1 10*3/uL (ref 0.0–0.7)
HEMATOCRIT: 38.8 % (ref 36.0–46.0)
Hemoglobin: 13.2 g/dL (ref 12.0–15.0)
Lymphocytes Relative: 20 %
Lymphs Abs: 1.6 10*3/uL (ref 0.7–4.0)
MCH: 31.1 pg (ref 26.0–34.0)
MCHC: 34 g/dL (ref 30.0–36.0)
MCV: 91.3 fL (ref 78.0–100.0)
MONOS PCT: 10 %
Monocytes Absolute: 0.8 10*3/uL (ref 0.1–1.0)
NEUTROS ABS: 5.6 10*3/uL (ref 1.7–7.7)
Neutrophils Relative %: 68 %
PLATELETS: 244 10*3/uL (ref 150–400)
RBC: 4.25 MIL/uL (ref 3.87–5.11)
RDW: 15 % (ref 11.5–15.5)
WBC: 8.2 10*3/uL (ref 4.0–10.5)

## 2017-06-16 LAB — COMPREHENSIVE METABOLIC PANEL
ALBUMIN: 3.6 g/dL (ref 3.5–5.0)
ALK PHOS: 55 U/L (ref 38–126)
ALT: 39 U/L (ref 14–54)
AST: 26 U/L (ref 15–41)
Anion gap: 9 (ref 5–15)
BILIRUBIN TOTAL: 0.7 mg/dL (ref 0.3–1.2)
BUN: 10 mg/dL (ref 6–20)
CALCIUM: 8.9 mg/dL (ref 8.9–10.3)
CO2: 25 mmol/L (ref 22–32)
CREATININE: 0.59 mg/dL (ref 0.44–1.00)
Chloride: 100 mmol/L — ABNORMAL LOW (ref 101–111)
GFR calc Af Amer: >60 mL/min (ref >60)
GLUCOSE: 104 mg/dL — AB (ref 65–99)
POTASSIUM: 3.4 mmol/L — AB (ref 3.5–5.1)
Sodium: 134 mmol/L — ABNORMAL LOW (ref 135–145)
TOTAL PROTEIN: 7.1 g/dL (ref 6.5–8.1)

## 2017-06-16 LAB — URINALYSIS, ROUTINE W REFLEX MICROSCOPIC
GLUCOSE, UA: NEGATIVE mg/dL
Ketones, ur: 15 mg/dL — AB
Leukocytes, UA: NEGATIVE
NITRITE: NEGATIVE
PH: 6.5 (ref 5.0–8.0)
PROTEIN: NEGATIVE mg/dL
Specific Gravity, Urine: 1.02 (ref 1.005–1.030)

## 2017-06-16 LAB — URINALYSIS, MICROSCOPIC (REFLEX): WBC UA: NONE SEEN WBC/hpf (ref 0–5)

## 2017-06-16 LAB — LIPASE, BLOOD: Lipase: 22 U/L (ref 11–51)

## 2017-06-16 MED ORDER — SACCHAROMYCES BOULARDII 250 MG PO CAPS: 250.0000 mg | ORAL_CAPSULE | Freq: Two times a day (BID) | ORAL | 0 refills | Status: DC

## 2017-06-16 MED ORDER — ONDANSETRON HCL 4 MG/2ML IJ SOLN
4.0000 mg | Freq: Once | INTRAMUSCULAR | Status: AC
  Administered 2017-06-16: 4 mg via INTRAVENOUS
  Filled 2017-06-16: qty 2

## 2017-06-16 MED ORDER — HYDROCODONE-ACETAMINOPHEN 5-325 MG PO TABS
1.0000 | ORAL_TABLET | Freq: Once | ORAL | Status: AC
  Administered 2017-06-16: 1 via ORAL
  Filled 2017-06-16: qty 1

## 2017-06-16 MED ORDER — MORPHINE SULFATE (PF) 4 MG/ML IV SOLN
4.0000 mg | Freq: Once | INTRAVENOUS | Status: AC
  Administered 2017-06-16: 4 mg via INTRAVENOUS
  Filled 2017-06-16: qty 1

## 2017-06-16 MED ORDER — HYDROCODONE-ACETAMINOPHEN 5-325 MG PO TABS: 1.0000 | ORAL_TABLET | Freq: Four times a day (QID) | ORAL | 0 refills | Status: DC | PRN

## 2017-06-16 MED ORDER — ONDANSETRON 4 MG PO TBDP: 4.0000 mg | ORAL_TABLET | Freq: Three times a day (TID) | ORAL | 0 refills | Status: DC | PRN

## 2017-06-16 MED ORDER — AMOXICILLIN-POT CLAVULANATE 875-125 MG PO TABS: 1.0000 | ORAL_TABLET | Freq: Two times a day (BID) | ORAL | 0 refills | Status: DC

## 2017-06-16 MED ORDER — IOPAMIDOL (ISOVUE-300) INJECTION 61%
100.0000 mL | Freq: Once | INTRAVENOUS | Status: AC | PRN
  Administered 2017-06-16: 100 mL via INTRAVENOUS

## 2017-06-16 MED ORDER — MORPHINE SULFATE (PF) 4 MG/ML IV SOLN
4.0000 mg | Freq: Once | INTRAVENOUS | Status: AC
Start: 2017-06-16 — End: 2017-06-16
  Administered 2017-06-16: 4 mg via INTRAVENOUS
  Filled 2017-06-16: qty 1

## 2017-06-16 MED FILL — FLORASTOR 250 MG CAPSULE: 250 | 10 days supply | Qty: 20 | Fill #0

## 2017-06-16 MED FILL — AMOX-CLAV 875-125 MG TABLET: 875-125 | 10 days supply | Qty: 20 | Fill #0

## 2017-06-16 MED FILL — HYDROCODON-APAP 5-325: 5-325 | 3 days supply | Qty: 15 | Fill #0

## 2017-06-16 MED FILL — ONDANSETRON ODT 4 MG TABLET: 4 | 2 days supply | Qty: 6 | Fill #0

## 2017-06-16 NOTE — ED Provider Notes (Signed)
[facility] EMERGENCY DEPARTMENT Provider Note   CSN: [id number] Arrival date & time: 06/16/17  1191     History   Chief Complaint Chief Complaint  Patient presents with  . Abdominal Pain    HPI Shannon Reyes is a 67 y.o. female.  HPI Is a 67 year old Caucasian female past medical history significant for hypertension, diverticulitis, GERD, obesity presents to the emergency department today with complaints of left lower quadrant abdominal pain.  Patient states this pain started approximately 2 days ago.  She describes it as a constant sharp pain that does not radiate.  Patient also reports some bladder fullness.  She states that initially she thought that she was constipated.  Took  MiraLAX.  She did have a bowel movement yesterday that was nonbloody.  She reports history of diverticulitis in July of this past year and states this feels very similar.  The patient states at that time as she failed outpatient Cipro and Flagyl twice and was seen by GI who switched her to Augmentin and states that this cleared up her infection.  Patient denies any associated fevers or chills.  She reports some nausea but denies any emesis.  Denies any vaginal symptoms.  Nothing makes her symptoms better.  Palpation makes the pain worse.  She is not take anything for the pain prior to arrival.  Pt denies any fever, chill, ha, vision changes, lightheadedness, dizziness, congestion, neck pain, cp, sob, cough, urinary symptoms, melena, hematochezia, lower extremity paresthesias.  Past Medical History:  Diagnosis Date  . Coronary artery fistula   . Diverticulitis   . Diverticulosis   . GERD (gastroesophageal reflux disease)   . Hypertension   . Obesity   . Thyroid disease     Patient Active Problem List   Diagnosis Date Noted  . GERD (gastroesophageal reflux disease) 01/25/2013  . HTN (hypertension) 12/31/2012  . Murmur 12/31/2012  . 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 Elevated LDL  cholesterol level 12/31/2012  . Preop cardiovascular exam 12/31/2012  . Morbid obesity-BMI 41 12/06/2012    Past Surgical History:  Procedure Laterality Date  . ABDOMINAL HYSTERECTOMY    . ANKLE SURGERY Right 2004/2014  . CESAREAN SECTION    . OVARIAN CYST REMOVAL      OB History    No data available       Home Medications    Prior to Admission medications   Medication Sig Start Date End Date Taking? Authorizing Provider  amLODipine (NORVASC) 10 MG tablet Take 10 mg by mouth daily.    [provider]  buPROPion (WELLBUTRIN XL) 300 MG 24 hr tablet Take 300 mg by mouth daily.    [provider]  fluticasone (FLONASE) 50 MCG/ACT nasal spray Place 1 spray into both nostrils 2 (two) times daily.    [provider]  levothyroxine (SYNTHROID, LEVOTHROID) 150 MCG tablet Take 150 mcg by mouth daily before breakfast.    [provider]  lisinopril (PRINIVIL,ZESTRIL) 20 MG tablet Take 20 mg by mouth daily.    [provider]  lisinopril-hydrochlorothiazide (PRINZIDE,ZESTORETIC) 20-25 MG per tablet Take 1 tablet by mouth daily.    [provider]  loratadine (CLARITIN) 10 MG tablet Take 10 mg by mouth daily.    [provider]  magnesium 30 MG tablet Take 30 mg by mouth at bedtime.    [provider]  metoprolol tartrate (LOPRESSOR) 25 MG tablet Take 1 tablet (25 mg total) by mouth once. 04/06/16 04/06/16  Norma Fredrickson  C, NP  ondansetron (ZOFRAN) 4 MG tablet Take 1 tablet (4 mg total) by mouth every 6 (six) hours as needed for nausea or vomiting. 12/20/16   Tegeler, Canary Brimhristopher J, MD  oxyCODONE-acetaminophen (PERCOCET/ROXICET) 5-325 MG tablet Take 1 tablet by mouth every 4 (four) hours as needed for severe pain. 12/20/16   Tegeler, Canary Brimhristopher J, MD  thyroid (ARMOUR) 120 MG tablet Take 120 mg by mouth daily before breakfast.    [provider]  vitamin B-12 (CYANOCOBALAMIN) 100 MCG tablet Take 200 mcg by mouth  daily.    [provider]    Family History Family History  Problem Relation Age of Onset  . Cancer Paternal Grandmother   . Breast cancer Neg Hx     Social History Social History   Tobacco Use  . Smoking status: Never Smoker  . Smokeless tobacco: Never Used  Substance Use Topics  . Alcohol use: Yes    Alcohol/week: 6.0 oz    Types: 10 Glasses of wine per week  . Drug use: No     Allergies   Codeine   Review of Systems Review of Systems  Constitutional: Negative for chills and fever.  HENT: Negative for congestion.   Eyes: Negative for visual disturbance.  Respiratory: Negative for cough and shortness of breath.   Cardiovascular: Negative for chest pain.  Gastrointestinal: Positive for abdominal pain, constipation and nausea. Negative for blood in stool, diarrhea and vomiting.  Genitourinary: Negative for dysuria, flank pain, frequency, hematuria, urgency, vaginal bleeding and vaginal discharge.  Musculoskeletal: Negative for arthralgias and myalgias.  Skin: Negative for rash.  Neurological: Negative for dizziness, syncope, weakness, light-headedness, numbness and headaches.  Psychiatric/Behavioral: Negative for sleep disturbance. The patient is not nervous/anxious.      Physical Exam Updated Vital Signs BP (!) 160/86   Pulse 81   Temp 98.4 F (36.9 C) (Oral)   Resp 16   Ht 5\' 4"  (1.626 m)   Wt 95.3 kg (210 lb)   SpO2 96%   BMI 36.05 kg/m   Physical Exam  Constitutional: She is oriented to person, place, and time. She appears well-developed and well-nourished.  Non-toxic appearance. No distress.  HENT:  Head: Normocephalic and atraumatic.  Nose: Nose normal.  Mouth/Throat: Oropharynx is clear and moist.  Eyes: Conjunctivae are normal. Pupils are equal, round, and reactive to light. Right eye exhibits no discharge. Left eye exhibits no discharge.  Neck: Normal range of motion. Neck supple.  Cardiovascular: Normal rate, regular rhythm, normal  heart sounds and intact distal pulses. Exam reveals no gallop and no friction rub.  No murmur heard. Pulmonary/Chest: Effort normal and breath sounds normal. No stridor. No respiratory distress. She has no wheezes. She has no rhonchi. She has no rales. She exhibits no tenderness.  Abdominal: Soft. Bowel sounds are normal. There is tenderness in the periumbilical area, suprapubic area, left upper quadrant and left lower quadrant. There is no rigidity, no rebound, no guarding, no CVA tenderness, no tenderness at McBurney's point and negative Murphy's sign.    Musculoskeletal: Normal range of motion. She exhibits no tenderness.  Lymphadenopathy:    She has no cervical adenopathy.  Neurological: She is alert and oriented to person, place, and time.  Skin: Skin is warm and dry. Capillary refill takes less than 2 seconds.  Psychiatric: Her behavior is normal. Judgment and thought content normal.  Nursing note and vitals reviewed.    ED Treatments / Results  Labs (all labs ordered are listed, but only abnormal  results are displayed) Labs Reviewed  COMPREHENSIVE METABOLIC PANEL - Abnormal; Notable for the following components:      Result Value   Sodium 134 (*)    Potassium 3.4 (*)    Chloride 100 (*)    Glucose, Bld 104 (*)    All other components within normal limits  URINALYSIS, ROUTINE W REFLEX MICROSCOPIC - Abnormal; Notable for the following components:   Hgb urine dipstick TRACE (*)    Bilirubin Urine SMALL (*)    Ketones, ur 15 (*)    All other components within normal limits  URINALYSIS, MICROSCOPIC (REFLEX) - Abnormal; Notable for the following components:   Bacteria, UA MANY (*)    Squamous Epithelial / LPF 0-5 (*)    All other components within normal limits  URINE CULTURE  LIPASE, BLOOD  CBC WITH DIFFERENTIAL/PLATELET    EKG  EKG Interpretation None       Radiology Ct Abdomen Pelvis W Contrast  Result Date: 06/16/2017 CLINICAL DATA:  Left lower quadrant  abdominal pain. Nausea, constipation. History of diverticulosis. EXAM: CT ABDOMEN AND PELVIS WITH CONTRAST TECHNIQUE: Multidetector CT imaging of the abdomen and pelvis was performed using the standard protocol following bolus administration of intravenous contrast. CONTRAST:  ISOVUE-300 IOPAMIDOL (ISOVUE-300) INJECTION 61% COMPARISON:  01/03/2017 FINDINGS: Lower chest: No acute abnormality. Hepatobiliary: No focal liver abnormality is seen. No gallstones, gallbladder wall thickening, or biliary dilatation. Pancreas: Unremarkable. No pancreatic ductal dilatation or surrounding inflammatory changes. Spleen: Normal in size without focal abnormality. Adrenals/Urinary Tract: Adrenal glands are unremarkable. Kidneys are normal, without renal calculi, focal lesion, or hydronephrosis. Bladder is unremarkable. Stomach/Bowel: No evidence of bowel obstruction. Persistent inflammatory changes with asymmetric mucosal thickening and pericolonic fat stranding of the sigmoid colon, on the background of extensive diverticulosis. No evidence of macro rupture or abscess formation. There has been interval development of significant circumferential mucosal thickening of an adjacent loop of the ileum, seen on axial images 62-68/96, sequence 2, with subsequent lumen stenosis and mild sub pathologic upstream dilation of the small bowel. Mucosal thickening of the anterior superior urinary bladder is also seen, with loss of fat plane between the abnormal portion of the urinary bladder and abnormal portion of the sigmoid colon. Vascular/Lymphatic: Minimal atherosclerotic disease of the aorta. Shotty left retroperitoneal lymph nodes. Reproductive: Status post hysterectomy. No adnexal masses. Other: No abdominal wall hernia or abnormality. No abdominopelvic ascites. Musculoskeletal: No acute or significant osseous findings. IMPRESSION: Persistent inflammatory changes of the sigmoid colon, on the background of extensive diverticulosis.  These findings may represent acute to subacute diverticulitis, however underlying malignancy cannot be excluded. No evidence of macro perforation or abscess formation. Interval development of circumferential mucosal thickening of an adjacent loop of ileum, causing lumen stenosis and mild sub pathologic upstream dilation of the small bowel. Persistent mucosal thickening of the adjacent superior anterior urinary bladder wall with loss of fat plane between the abnormal portion of the urinary bladder and sigmoid colon. Enterovesicular fistula cannot be excluded, image 47/110, sequence 5. Shotty mesenteric and left retroperitoneal lymph nodes. Electronically Signed   By: Ted Mcalpine M.D.   On: 06/16/2017 12:22    Procedures Procedures (including critical care time)  Medications Ordered in ED Medications  morphine 4 MG/ML injection 4 mg (4 mg Intravenous Given 06/16/17 1046)  ondansetron (ZOFRAN) injection 4 mg (4 mg Intravenous Given 06/16/17 1046)  iopamidol (ISOVUE-300) 61 % injection 100 mL (100 mLs Intravenous Contrast Given 06/16/17 1153)  morphine 4 MG/ML injection 4 mg (4  mg Intravenous Given 06/16/17 1217)  HYDROcodone-acetaminophen (NORCO/VICODIN) 5-325 MG per tablet 1 tablet (1 tablet Oral Given 06/16/17 1334)     Initial Impression / Assessment and Plan / ED Course  I have reviewed the triage vital signs and the nursing notes.  Pertinent labs & imaging results that were available during my care of the patient were reviewed by me and considered in my medical decision making (see chart for details).     Patient presents to the ED with complaints of left lower quadrant abdominal pain that is consistent with her prior diverticulitis.  The patient reports some nausea but denies any associated fever, vomiting, bloody stools.  Patient is having bowel movements after taking MiraLAX and reports passing gas.  On exam patient is overall well-appearing and nontoxic appearing.  Vital signs are  reassuring.  Patient is afebrile in the ED.  No tachycardia or hypotension is noted.  The patient does have some tenderness to palpation of the epigastric and left lower quadrant on exam.  No signs of peritonitis.  No other focal findings appreciated on physical exam.  Lab work is reassuring.  No leukocytosis.  Electrolytes appear at patient's baseline.  Normal kidney function.  Lipase is normal.  UA shows many bacteria with yeast and squamous epithelium cells.  No leukocytes were appreciated and is negative nitrite.  The patient's urine seems consistent with her prior urine.  She is not have any urinary symptoms.  We will culture at this time which is pending.  We will not treat at this time.  CT was obtained given patient's prior history of diverticulitis to rule out any complications.  CT scan returned and shows continued inflammatory changes that is consistent with acute or subacute diverticulitis.    IMPRESSION: Persistent inflammatory changes of the sigmoid colon, on the background of extensive diverticulosis. These findings may represent acute to subacute diverticulitis, however underlying malignancy cannot be excluded. No evidence of macro perforation or abscess formation. Interval development of circumferential mucosal thickening of an adjacent loop of ileum, causing lumen stenosis and mild sub pathologic upstream dilation of the small bowel. Persistent mucosal thickening of the adjacent superior anterior urinary bladder wall with loss of fat plane between the abnormal portion of the urinary bladder and sigmoid colon. Enterovesicular fistula cannot be excluded, image 47/110, sequence 5. Shotty mesenteric and left retroperitoneal lymph nodes.  The patient CT findings were compared to her prior CT in July 2018 that seems very similar.  Patient has no signs of obstruction.  She is passing gas and not vomiting.  I did discuss case with patient's GI doctor's pa (Dr. Matthias Hughs and Celso Amy PA-C) at  [facility].  She recommended that I start patient on Augmentin as she failed Cipro and Flagyl on her prior diverticulitis flare.  She also recommended to start patient on a probiotic.  I will give her pain medicine and nausea medicine.  They will see that her in the office in 3 days.  At that time patient will likely need further evaluation and possible urology referral.  Again patient's urine culture is pending however she has no urinary symptoms.  UA seems consistent with prior UA and did not feel a need to treat at this time.  Patient's pain has been controlled in the ED.  She has no signs of intractable vomiting.  Patient feels stable for discharge at this time.  Pt is hemodynamically stable, in NAD, & able to ambulate in the ED. Evaluation does not show pathology  that would require ongoing emergent intervention or inpatient treatment. I explained the diagnosis to the patient. Pain has been managed & has no complaints prior to dc. Pt is comfortable with above plan and is stable for discharge at this time. All questions were answered prior to disposition. Strict return precautions for f/u to the ED were discussed. Encouraged follow up with PCP.  Patient was discussed with my attending Dr. Jacqulyn Bath who is agreeable the above plan.  Final Clinical Impressions(s) / ED Diagnoses   Final diagnoses:  LLQ abdominal pain    ED Discharge Orders        Ordered    amoxicillin-clavulanate (AUGMENTIN) 875-125 MG tablet  Every 12 hours     06/16/17 1324    HYDROcodone-acetaminophen (NORCO) 5-325 MG tablet  Every 6 hours PRN     06/16/17 1324    ondansetron (ZOFRAN-ODT) 4 MG disintegrating tablet  Every 8 hours PRN     06/16/17 1324    saccharomyces boulardii (FLORASTOR) 250 MG capsule  2 times daily     06/16/17 1324       Rise Mu, PA-C 06/16/17 1716    Rise Mu, PA-C 06/16/17 1716    Maia Plan, MD 06/16/17 2002

## 2017-06-16 NOTE — Discharge Instructions (Signed)
Have discussed her CT findings with you.  Start taking the Augmentin as prescribed.  I would also take the probiotic prescribed.  Have given you short course of pain medicine.  First dose given in the ED.  You can also take NSAIDs such as ibuprofen or Motrin.  Bowel rest for 24 hours then clear liquid diet and advance her diet as tolerated.  Please follow-up with the GI doctor on Monday.  Return to the ED if you develop any worsening pain, fever, vomiting, unable to pass gas or have a bowel movement.

## 2017-06-16 NOTE — ED Triage Notes (Signed)
Pt having left sided pain to abdomen since Wednesday.  Pt also having lower back pain.  Some constipation and nausea.  No known fever.

## 2017-06-17 LAB — URINE CULTURE: CULTURE: NO GROWTH

## 2017-06-19 DIAGNOSIS — K5900 Constipation, unspecified: Secondary | ICD-10-CM | POA: Diagnosis not present

## 2017-06-19 DIAGNOSIS — K5792 Diverticulitis of intestine, part unspecified, without perforation or abscess without bleeding: Secondary | ICD-10-CM | POA: Diagnosis not present

## 2017-06-19 DIAGNOSIS — R1032 Left lower quadrant pain: Secondary | ICD-10-CM | POA: Diagnosis not present

## 2017-06-30 DIAGNOSIS — K5792 Diverticulitis of intestine, part unspecified, without perforation or abscess without bleeding: Secondary | ICD-10-CM | POA: Diagnosis not present

## 2017-07-12 DIAGNOSIS — E559 Vitamin D deficiency, unspecified: Secondary | ICD-10-CM | POA: Diagnosis not present

## 2017-07-12 DIAGNOSIS — N951 Menopausal and female climacteric states: Secondary | ICD-10-CM | POA: Diagnosis not present

## 2017-07-12 DIAGNOSIS — E538 Deficiency of other specified B group vitamins: Secondary | ICD-10-CM | POA: Diagnosis not present

## 2017-07-12 DIAGNOSIS — R5383 Other fatigue: Secondary | ICD-10-CM | POA: Diagnosis not present

## 2017-07-12 DIAGNOSIS — E039 Hypothyroidism, unspecified: Secondary | ICD-10-CM | POA: Diagnosis not present

## 2017-07-12 DIAGNOSIS — I10 Essential (primary) hypertension: Secondary | ICD-10-CM | POA: Diagnosis not present

## 2017-08-07 DIAGNOSIS — R5383 Other fatigue: Secondary | ICD-10-CM | POA: Diagnosis not present

## 2017-08-07 DIAGNOSIS — F419 Anxiety disorder, unspecified: Secondary | ICD-10-CM | POA: Diagnosis not present

## 2017-08-07 DIAGNOSIS — E039 Hypothyroidism, unspecified: Secondary | ICD-10-CM | POA: Diagnosis not present

## 2017-08-07 DIAGNOSIS — N951 Menopausal and female climacteric states: Secondary | ICD-10-CM | POA: Diagnosis not present

## 2017-10-12 ENCOUNTER — Emergency Department (HOSPITAL_BASED_OUTPATIENT_CLINIC_OR_DEPARTMENT_OTHER): Payer: BLUE CROSS/BLUE SHIELD

## 2017-10-12 ENCOUNTER — Other Ambulatory Visit: Payer: Self-pay

## 2017-10-12 ENCOUNTER — Emergency Department (HOSPITAL_BASED_OUTPATIENT_CLINIC_OR_DEPARTMENT_OTHER)
Admission: EM | Admit: 2017-10-12 | Discharge: 2017-10-12 | Disposition: A | Discharge status: Home / Self Care | Payer: BLUE CROSS/BLUE SHIELD | Attending: Emergency Medicine | Admitting: Emergency Medicine

## 2017-10-12 ENCOUNTER — Encounter (HOSPITAL_BASED_OUTPATIENT_CLINIC_OR_DEPARTMENT_OTHER): Payer: Self-pay | Admitting: *Deleted

## 2017-10-12 DIAGNOSIS — M7989 Other specified soft tissue disorders: Secondary | ICD-10-CM | POA: Diagnosis not present

## 2017-10-12 DIAGNOSIS — Y939 Activity, unspecified: Secondary | ICD-10-CM | POA: Insufficient documentation

## 2017-10-12 DIAGNOSIS — Y999 Unspecified external cause status: Secondary | ICD-10-CM | POA: Insufficient documentation

## 2017-10-12 DIAGNOSIS — I10 Essential (primary) hypertension: Secondary | ICD-10-CM | POA: Diagnosis not present

## 2017-10-12 DIAGNOSIS — T148XXA Other injury of unspecified body region, initial encounter: Secondary | ICD-10-CM

## 2017-10-12 DIAGNOSIS — M79662 Pain in left lower leg: Secondary | ICD-10-CM

## 2017-10-12 DIAGNOSIS — S8012XA Contusion of left lower leg, initial encounter: Secondary | ICD-10-CM | POA: Insufficient documentation

## 2017-10-12 DIAGNOSIS — M7981 Nontraumatic hematoma of soft tissue: Secondary | ICD-10-CM | POA: Diagnosis not present

## 2017-10-12 DIAGNOSIS — Y929 Unspecified place or not applicable: Secondary | ICD-10-CM | POA: Insufficient documentation

## 2017-10-12 DIAGNOSIS — R2242 Localized swelling, mass and lump, left lower limb: Secondary | ICD-10-CM | POA: Diagnosis present

## 2017-10-12 DIAGNOSIS — X58XXXA Exposure to other specified factors, initial encounter: Secondary | ICD-10-CM | POA: Insufficient documentation

## 2017-10-12 DIAGNOSIS — Z79899 Other long term (current) drug therapy: Secondary | ICD-10-CM | POA: Diagnosis not present

## 2017-10-12 DIAGNOSIS — M25562 Pain in left knee: Secondary | ICD-10-CM | POA: Diagnosis not present

## 2017-10-12 NOTE — Discharge Instructions (Signed)
Please read and follow all provided instructions.  Your diagnoses today include:  1. Hematoma   2. Pain of left calf     Tests performed today include:  Ultrasound of the leg -does not show any blood clots in the veins or Baker's cyst.  Does show an area which appears most like a hematoma from an injury of the calf muscle.  Vital signs. See below for your results today.   Medications prescribed:   None  Take any prescribed medications only as directed.  Home care instructions:  Follow any educational materials contained in this packet.  BE VERY CAREFUL not to take multiple medicines containing Tylenol (also called acetaminophen). Doing so can lead to an overdose which can damage your liver and cause liver failure and possibly death.   Follow-up instructions: Please follow-up with your primary care provider or orthopedist in the next 1-2 weeks for further evaluation of your symptoms if it is not getting better.   Return instructions:   Please return to the Emergency Department if you experience worsening symptoms.   Please return if you have any other emergent concerns.  Additional Information:  Your vital signs today were: BP (!) 179/87 (BP Location: Right Arm)    Pulse 71    Temp 98.3 F (36.8 C) (Oral)    Resp 18    Ht  (1.6 m)    Wt 99.3 kg (219 lb)    SpO2 100%    BMI 38.79 kg/m  If your blood pressure (BP) was elevated above 135/85 this visit, please have this repeated by your doctor within one month. --------------

## 2017-10-12 NOTE — ED Provider Notes (Signed)
[facility] EMERGENCY DEPARTMENT Provider Note   CSN: [id number] Arrival date & time: 10/12/17  1321     History   Chief Complaint Chief Complaint  Patient presents with  . Knee Pain    HPI Shannon Reyes is a 67 y.o. female.  Patient presents to the emergency department with complaint of swelling behind her left knee with a mild throbbing pain to her left calf starting yesterday.  There is no injury.  No redness or warmth.  Patient has not taken any treatments.  No history of blood clots or anticoagulation.  Pain is worse with palpation and ambulation.  Nothing makes symptoms worse.  Symptoms occur at rest. The onset of this condition was acute. The course is gradually worsening.      Past Medical History:  Diagnosis Date  . Coronary artery fistula   . Diverticulitis   . Diverticulosis   . GERD (gastroesophageal reflux disease)   . Hypertension   . Obesity   . Thyroid disease     Patient Active Problem List   Diagnosis Date Noted  . GERD (gastroesophageal reflux disease) 01/25/2013  . HTN (hypertension) 12/31/2012  . Murmur 12/31/2012  . 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 Elevated LDL cholesterol level 12/31/2012  . Preop cardiovascular exam 12/31/2012  . Morbid obesity-BMI 41 12/06/2012    Past Surgical History:  Procedure Laterality Date  . ABDOMINAL HYSTERECTOMY    . ANKLE SURGERY Right 2004/2014  . CESAREAN SECTION    . OVARIAN CYST REMOVAL       OB History   None      Home Medications    Prior to Admission medications   Medication Sig Start Date End Date Taking? Authorizing Provider  amLODipine (NORVASC) 10 MG tablet Take 10 mg by mouth daily.   Yes [provider]  amoxicillin-clavulanate (AUGMENTIN) 875-125 MG tablet Take 1 tablet by mouth every 12 (twelve) hours. 06/16/17  Yes Leaphart, Lynann Beaver, PA-C  estradiol (CLIMARA - DOSED IN MG/24 HR) 0.025 mg/24hr patch Place 0.025 mg onto the skin once a week.   Yes [provider]  hydrochlorothiazide (HYDRODIURIL) 25 MG tablet Take 25 mg by mouth daily.   Yes [provider]  HYDROcodone-acetaminophen (NORCO) 5-325 MG tablet Take 1 tablet by mouth every 6 (six) hours as needed. 06/16/17  Yes Leaphart, Lynann Beaver, PA-C  levothyroxine (SYNTHROID, LEVOTHROID) 150 MCG tablet Take 150 mcg by mouth daily before breakfast.   Yes [provider]  magnesium 30 MG tablet Take 30 mg by mouth at bedtime.   Yes [provider]  Testosterone 10 MG/ACT (2%) GEL Place onto the skin every morning.   Yes [provider]  buPROPion (WELLBUTRIN XL) 300 MG 24 hr tablet Take 300 mg by mouth daily.    [provider]  fluticasone (FLONASE) 50 MCG/ACT nasal spray Place 1 spray into both nostrils 2 (two) times daily.    [provider]  lisinopril (PRINIVIL,ZESTRIL) 20 MG tablet Take 20 mg by mouth daily.    [provider]  lisinopril-hydrochlorothiazide (PRINZIDE,ZESTORETIC) 20-25 MG per tablet Take 1 tablet by mouth daily.    [provider]  loratadine (CLARITIN) 10 MG tablet Take 10 mg by mouth daily.    [provider]  metoprolol tartrate (LOPRESSOR) 25 MG tablet Take 1 tablet (25 mg total) by mouth once. 04/06/16 04/06/16  Rosalio Macadamia, NP  ondansetron (ZOFRAN) 4 MG tablet Take 1 tablet (4 mg total) by mouth every  6 (six) hours as needed for nausea or vomiting. 12/20/16   Tegeler, Canary Brim, MD  ondansetron (ZOFRAN-ODT) 4 MG disintegrating tablet Take 1 tablet (4 mg total) by mouth every 8 (eight) hours as needed for nausea. 06/16/17   Rise Mu, PA-C  oxyCODONE-acetaminophen (PERCOCET/ROXICET) 5-325 MG tablet Take 1 tablet by mouth every 4 (four) hours as needed for severe pain. 12/20/16   Tegeler, Canary Brim, MD  saccharomyces boulardii (FLORASTOR) 250 MG capsule Take 1 capsule (250 mg total) by mouth 2 (two) times daily. 06/16/17   Rise Mu, PA-C  thyroid (ARMOUR) 120 MG  tablet Take 120 mg by mouth daily before breakfast.    [provider]  vitamin B-12 (CYANOCOBALAMIN) 100 MCG tablet Take 200 mcg by mouth daily.    [provider]    Family History Family History  Problem Relation Age of Onset  . Cancer Paternal Grandmother   . Breast cancer Neg Hx     Social History Social History   Tobacco Use  . Smoking status: Never Smoker  . Smokeless tobacco: Never Used  Substance Use Topics  . Alcohol use: Yes    Alcohol/week: 6.0 oz    Types: 10 Glasses of wine per week  . Drug use: No     Allergies   Codeine   Review of Systems Review of Systems  Constitutional: Negative for activity change.  Musculoskeletal: Positive for arthralgias and joint swelling. Negative for back pain, gait problem and neck pain.  Skin: Negative for wound.  Neurological: Negative for weakness and numbness.     Physical Exam Updated Vital Signs BP (!) 179/87 (BP Location: Right Arm)   Pulse 71   Temp 98.3 F (36.8 C) (Oral)   Resp 18   Ht  (1.6 m)   Wt 99.3 kg (219 lb)   SpO2 100%   BMI 38.79 kg/m   Physical Exam  Constitutional: She appears well-developed and well-nourished.  HENT:  Head: Normocephalic and atraumatic.  Eyes: Pupils are equal, round, and reactive to light.  Neck: Normal range of motion. Neck supple.  Cardiovascular: Normal pulses. Exam reveals no decreased pulses.  Musculoskeletal: She exhibits tenderness. She exhibits no edema.  Full range of motion of left knee.  Mild fullness, nonpulsatile, and the popliteal space.  No significant calf tenderness or edema.  Distal extremity is well-perfused with normal pedal pulse and capillary refill.  No hip pain or ankle pain.  Neurological: She is alert. No sensory deficit.  Motor, sensation, and vascular distal to the injury is fully intact.   Skin: Skin is warm and dry.  Psychiatric: She has a normal mood and affect.  Nursing note and vitals reviewed.    ED  Treatments / Results  Labs (all labs ordered are listed, but only abnormal results are displayed) Labs Reviewed - No data to display  EKG None  Radiology No results found.  Procedures Procedures (including critical care time)  Medications Ordered in ED Medications - No data to display   Initial Impression / Assessment and Plan / ED Course  I have reviewed the triage vital signs and the nursing notes.  Pertinent labs & imaging results that were available during my care of the patient were reviewed by me and considered in my medical decision making (see chart for details).     Patient seen and examined. Work-up initiated.  Will obtain venous Doppler to rule out DVT and to evaluate for possible Baker's cyst.  Vital signs reviewed  and are as follows: BP (!) 179/87 (BP Location: Right Arm)   Pulse 71   Temp 98.3 F (36.8 C) (Oral)   Resp 18   Ht  (1.6 m)   Wt 99.3 kg (219 lb)   SpO2 100%   BMI 38.79 kg/m   Ultrasound complete.  No DVT or Baker's cyst.  Does show hematoma and probable calf injury.  Patient updated on results.  Encouraged rice protocol, warm compresses.  Encourage conservative measures for next 1 to 2 weeks, follow-up with her orthopedist or PCP if symptoms are not improving.  Return to the emergency department with worsening.  Final Clinical Impressions(s) / ED Diagnoses   Final diagnoses:  Hematoma  Pain of left calf   Patient with probable proximal calf hematoma from occult injury.  Patient is not anticoagulated and does not have any other signs of bleeding.  Will treat conservatively with PCP/Ortho follow-up.  No development of compartment syndrome during ED stay.  ED Discharge Orders    None       Renne Crigler, Cordelia Poche 10/12/17 1824    Tegeler, Canary Brim, MD 10/13/17 [phone]

## 2017-10-12 NOTE — ED Triage Notes (Signed)
Left leg pain and pain behind her left knee. Her left arm was swollen yesterday while working on the computer.

## 2017-10-17 DIAGNOSIS — F902 Attention-deficit hyperactivity disorder, combined type: Secondary | ICD-10-CM | POA: Diagnosis not present

## 2017-10-17 DIAGNOSIS — F411 Generalized anxiety disorder: Secondary | ICD-10-CM | POA: Diagnosis not present

## 2017-10-17 DIAGNOSIS — F4325 Adjustment disorder with mixed disturbance of emotions and conduct: Secondary | ICD-10-CM | POA: Diagnosis not present

## 2017-10-17 DIAGNOSIS — F331 Major depressive disorder, recurrent, moderate: Secondary | ICD-10-CM | POA: Diagnosis not present

## 2017-10-26 DIAGNOSIS — E559 Vitamin D deficiency, unspecified: Secondary | ICD-10-CM | POA: Diagnosis not present

## 2017-10-26 DIAGNOSIS — E039 Hypothyroidism, unspecified: Secondary | ICD-10-CM | POA: Diagnosis not present

## 2017-10-26 DIAGNOSIS — N951 Menopausal and female climacteric states: Secondary | ICD-10-CM | POA: Diagnosis not present

## 2017-10-26 DIAGNOSIS — R5383 Other fatigue: Secondary | ICD-10-CM | POA: Diagnosis not present

## 2017-10-26 DIAGNOSIS — E538 Deficiency of other specified B group vitamins: Secondary | ICD-10-CM | POA: Diagnosis not present

## 2017-11-07 DIAGNOSIS — E7212 Methylenetetrahydrofolate reductase deficiency: Secondary | ICD-10-CM | POA: Diagnosis not present

## 2017-11-07 DIAGNOSIS — F419 Anxiety disorder, unspecified: Secondary | ICD-10-CM | POA: Diagnosis not present

## 2017-11-07 DIAGNOSIS — R5383 Other fatigue: Secondary | ICD-10-CM | POA: Diagnosis not present

## 2017-11-07 DIAGNOSIS — N951 Menopausal and female climacteric states: Secondary | ICD-10-CM | POA: Diagnosis not present

## 2017-11-18 DIAGNOSIS — N3000 Acute cystitis without hematuria: Secondary | ICD-10-CM | POA: Diagnosis not present

## 2017-12-22 DIAGNOSIS — Z961 Presence of intraocular lens: Secondary | ICD-10-CM | POA: Diagnosis not present

## 2018-01-23 DIAGNOSIS — E559 Vitamin D deficiency, unspecified: Secondary | ICD-10-CM | POA: Diagnosis not present

## 2018-01-23 DIAGNOSIS — E039 Hypothyroidism, unspecified: Secondary | ICD-10-CM | POA: Diagnosis not present

## 2018-01-23 DIAGNOSIS — N951 Menopausal and female climacteric states: Secondary | ICD-10-CM | POA: Diagnosis not present

## 2018-01-23 DIAGNOSIS — R5383 Other fatigue: Secondary | ICD-10-CM | POA: Diagnosis not present

## 2018-01-23 DIAGNOSIS — E538 Deficiency of other specified B group vitamins: Secondary | ICD-10-CM | POA: Diagnosis not present

## 2018-02-05 DIAGNOSIS — R5383 Other fatigue: Secondary | ICD-10-CM | POA: Diagnosis not present

## 2018-02-05 DIAGNOSIS — N951 Menopausal and female climacteric states: Secondary | ICD-10-CM | POA: Diagnosis not present

## 2018-02-05 DIAGNOSIS — F419 Anxiety disorder, unspecified: Secondary | ICD-10-CM | POA: Diagnosis not present

## 2018-02-05 DIAGNOSIS — E039 Hypothyroidism, unspecified: Secondary | ICD-10-CM | POA: Diagnosis not present

## 2018-02-08 DIAGNOSIS — M545 Low back pain: Secondary | ICD-10-CM | POA: Diagnosis not present

## 2018-02-20 DIAGNOSIS — M545 Low back pain: Secondary | ICD-10-CM | POA: Diagnosis not present

## 2018-02-26 DIAGNOSIS — H25041 Posterior subcapsular polar age-related cataract, right eye: Secondary | ICD-10-CM | POA: Diagnosis not present

## 2018-02-26 DIAGNOSIS — H524 Presbyopia: Secondary | ICD-10-CM | POA: Diagnosis not present

## 2018-03-02 DIAGNOSIS — M545 Low back pain: Secondary | ICD-10-CM | POA: Diagnosis not present

## 2018-03-09 DIAGNOSIS — M5136 Other intervertebral disc degeneration, lumbar region: Secondary | ICD-10-CM | POA: Diagnosis not present

## 2018-03-13 DIAGNOSIS — F4325 Adjustment disorder with mixed disturbance of emotions and conduct: Secondary | ICD-10-CM | POA: Diagnosis not present

## 2018-03-13 DIAGNOSIS — F902 Attention-deficit hyperactivity disorder, combined type: Secondary | ICD-10-CM | POA: Diagnosis not present

## 2018-03-13 DIAGNOSIS — F411 Generalized anxiety disorder: Secondary | ICD-10-CM | POA: Diagnosis not present

## 2018-03-13 DIAGNOSIS — F331 Major depressive disorder, recurrent, moderate: Secondary | ICD-10-CM | POA: Diagnosis not present

## 2018-03-14 DIAGNOSIS — H2511 Age-related nuclear cataract, right eye: Secondary | ICD-10-CM | POA: Diagnosis not present

## 2018-03-14 DIAGNOSIS — H25041 Posterior subcapsular polar age-related cataract, right eye: Secondary | ICD-10-CM | POA: Diagnosis not present

## 2018-03-30 DIAGNOSIS — M51369 Other intervertebral disc degeneration, lumbar region without mention of lumbar back pain or lower extremity pain: Secondary | ICD-10-CM | POA: Insufficient documentation

## 2018-03-30 DIAGNOSIS — M5136 Other intervertebral disc degeneration, lumbar region: Secondary | ICD-10-CM | POA: Diagnosis not present

## 2018-03-30 DIAGNOSIS — M47816 Spondylosis without myelopathy or radiculopathy, lumbar region: Secondary | ICD-10-CM | POA: Diagnosis not present

## 2018-04-17 ENCOUNTER — Encounter (HOSPITAL_BASED_OUTPATIENT_CLINIC_OR_DEPARTMENT_OTHER): Payer: Self-pay | Admitting: Emergency Medicine

## 2018-04-17 ENCOUNTER — Emergency Department (HOSPITAL_BASED_OUTPATIENT_CLINIC_OR_DEPARTMENT_OTHER)
Admission: EM | Admit: 2018-04-17 | Discharge: 2018-04-17 | Disposition: A | Discharge status: Home / Self Care | Payer: BLUE CROSS/BLUE SHIELD | Attending: Emergency Medicine | Admitting: Emergency Medicine

## 2018-04-17 ENCOUNTER — Other Ambulatory Visit: Payer: Self-pay

## 2018-04-17 DIAGNOSIS — Z79899 Other long term (current) drug therapy: Secondary | ICD-10-CM | POA: Insufficient documentation

## 2018-04-17 DIAGNOSIS — R1032 Left lower quadrant pain: Secondary | ICD-10-CM | POA: Diagnosis present

## 2018-04-17 DIAGNOSIS — K5792 Diverticulitis of intestine, part unspecified, without perforation or abscess without bleeding: Secondary | ICD-10-CM

## 2018-04-17 DIAGNOSIS — K5732 Diverticulitis of large intestine without perforation or abscess without bleeding: Secondary | ICD-10-CM | POA: Insufficient documentation

## 2018-04-17 DIAGNOSIS — I10 Essential (primary) hypertension: Secondary | ICD-10-CM | POA: Insufficient documentation

## 2018-04-17 MED ORDER — ACETAMINOPHEN 500 MG PO TABS
1000.0000 mg | ORAL_TABLET | Freq: Once | ORAL | Status: AC
  Administered 2018-04-17: 1000 mg via ORAL
  Filled 2018-04-17: qty 2

## 2018-04-17 MED ORDER — AMOXICILLIN-POT CLAVULANATE 875-125 MG PO TABS: 1.0000 | ORAL_TABLET | Freq: Two times a day (BID) | ORAL | 0 refills | Status: DC

## 2018-04-17 MED ORDER — OXYCODONE HCL 5 MG PO TABS
5.0000 mg | ORAL_TABLET | Freq: Once | ORAL | Status: AC
  Administered 2018-04-17: 5 mg via ORAL
  Filled 2018-04-17: qty 1

## 2018-04-17 MED ORDER — MORPHINE SULFATE 15 MG PO TABS: 15.0000 mg | ORAL_TABLET | ORAL | 0 refills | Status: DC | PRN

## 2018-04-17 MED ORDER — AMOXICILLIN-POT CLAVULANATE 875-125 MG PO TABS
1.0000 | ORAL_TABLET | Freq: Once | ORAL | Status: AC
  Administered 2018-04-17: 1 via ORAL
  Filled 2018-04-17: qty 1

## 2018-04-17 MED FILL — MORPHINE SULFATE IR 15 MG T: 15 | 1 days supply | Qty: 4 | Fill #0

## 2018-04-17 MED FILL — AMOX-CLAV 875-125 MG TABLET: 875-125 | 7 days supply | Qty: 14 | Fill #0

## 2018-04-17 NOTE — ED Provider Notes (Signed)
[facility] EMERGENCY DEPARTMENT Provider Note   CSN: [id number] Arrival date & time: 04/17/18  4098     History   Chief Complaint Chief Complaint  Patient presents with  . Abdominal Pain    HPI Shannon Reyes is a 67 y.o. female.  67 yo F with a chief complaint of left lower quadrant abdominal pain.  Going on for the past 3 days.  Started as a bit of an ache around her suprapubic region and now is consistent with what she has had previously with diverticulitis.  She denies fevers or chills.  Has had some mild nausea this morning.  Denies diarrhea.  Denies dark or bloody stool.  She denies air in her urinary stream or change in color to her urine.  Denies any current urinary symptoms.  Old records were reviewed and the patient has had multiple CT scans representing diverticulitis.  She has a complicated anatomy in her left lower quadrant and there is thought to be may be a fistula versus cancer in the area.  She has seen her GI doctor with an outpatient CT. She denies recent colonoscopy.  The history is provided by the patient.  Abdominal Pain   This is a new problem. The current episode started more than 2 days ago. The problem occurs constantly. The problem has been gradually worsening. The pain is associated with an unknown factor. The pain is located in the LLQ. The quality of the pain is sharp, shooting and cramping. The pain is at a severity of 3/10. The pain is moderate. Associated symptoms include nausea. Pertinent negatives include anorexia, fever, diarrhea, vomiting, dysuria, headaches, arthralgias and myalgias. Nothing aggravates the symptoms. Nothing relieves the symptoms. Past workup includes GI consult and CT scan.    Past Medical History:  Diagnosis Date  . Coronary artery fistula   . Diverticulitis   . Diverticulosis   . GERD (gastroesophageal reflux disease)   . Hypertension   . Obesity   . Thyroid disease     Patient Active Problem List   Diagnosis  Date Noted  . GERD (gastroesophageal reflux disease) 01/25/2013  . HTN (hypertension) 12/31/2012  . Murmur 12/31/2012  . 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 Elevated LDL cholesterol level 12/31/2012  . Preop cardiovascular exam 12/31/2012  . Morbid obesity-BMI 41 12/06/2012    Past Surgical History:  Procedure Laterality Date  . ABDOMINAL HYSTERECTOMY    . ANKLE SURGERY Right 2004/2014  . CATARACT EXTRACTION    . CESAREAN SECTION    . OVARIAN CYST REMOVAL       OB History   None      Home Medications    Prior to Admission medications   Medication Sig Start Date End Date Taking? Authorizing Provider  amoxicillin-clavulanate (AUGMENTIN) 875-125 MG tablet Take 1 tablet by mouth every 12 (twelve) hours. 04/17/18   Melene Plan, DO  buPROPion (WELLBUTRIN XL) 300 MG 24 hr tablet Take 300 mg by mouth daily.    [provider]  estradiol (CLIMARA - DOSED IN MG/24 HR) 0.025 mg/24hr patch Place 0.025 mg onto the skin once a week.    [provider]  EVEKEO 10 MG TABS  03/20/18   [provider]  hydrochlorothiazide (HYDRODIURIL) 25 MG tablet Take 25 mg by mouth daily.    [provider]  lisinopril (PRINIVIL,ZESTRIL) 20 MG tablet Take 40 mg by mouth 2 (two) times daily.     [provider]  magnesium 30 MG tablet Take 30  mg by mouth at bedtime.    [provider]  methocarbamol (ROBAXIN) 500 MG tablet  02/08/18   [provider]  metoprolol tartrate (LOPRESSOR) 25 MG tablet Take 1 tablet (25 mg total) by mouth once. 04/06/16 04/06/16  Rosalio Macadamia, NP  morphine (MSIR) 15 MG tablet Take 1 tablet (15 mg total) by mouth every 4 (four) hours as needed for severe pain. 04/17/18   Melene Plan, DO  progesterone (PROMETRIUM) 100 MG capsule  03/22/18   [provider]  saccharomyces boulardii (FLORASTOR) 250 MG capsule Take 1 capsule (250 mg total) by mouth 2 (two) times daily. 06/16/17   Rise Mu, PA-C    Testosterone 10 MG/ACT (2%) GEL Place onto the skin every morning.    [provider]  thyroid (ARMOUR) 120 MG tablet Take 120 mg by mouth daily before breakfast.    [provider]    Family History Family History  Problem Relation Age of Onset  . Cancer Paternal Grandmother   . Breast cancer Neg Hx     Social History Social History   Tobacco Use  . Smoking status: Never Smoker  . Smokeless tobacco: Never Used  Substance Use Topics  . Alcohol use: Yes    Alcohol/week: 10.0 standard drinks    Types: 10 Glasses of wine per week    Comment: Vokda or wine daily  . Drug use: No     Allergies   Codeine   Review of Systems Review of Systems  Constitutional: Negative for chills and fever.  HENT: Negative for congestion and rhinorrhea.   Eyes: Negative for redness and visual disturbance.  Respiratory: Negative for shortness of breath and wheezing.   Cardiovascular: Negative for chest pain and palpitations.  Gastrointestinal: Positive for abdominal pain and nausea. Negative for anorexia, diarrhea and vomiting.  Genitourinary: Negative for dysuria and urgency.  Musculoskeletal: Negative for arthralgias and myalgias.  Skin: Negative for pallor and wound.  Neurological: Negative for dizziness and headaches.     Physical Exam Updated Vital Signs BP (!) 181/105 (BP Location: Right Arm)   Pulse 68   Temp 98.7 F (37.1 C) (Oral)   Resp 16   Ht 5\' 3"  (1.6 m)   Wt 97.5 kg   SpO2 97%   BMI 38.09 kg/m   Physical Exam  Constitutional: She is oriented to person, place, and time. She appears well-developed and well-nourished. No distress.  HENT:  Head: Normocephalic and atraumatic.  Eyes: Pupils are equal, round, and reactive to light. EOM are normal.  Neck: Normal range of motion. Neck supple.  Cardiovascular: Normal rate and regular rhythm. Exam reveals no gallop and no friction rub.  No murmur heard. Pulmonary/Chest: Effort normal. She has no wheezes.  She has no rales.  Abdominal: Soft. She exhibits no distension. There is tenderness (minimally tender to the LLQ).  Musculoskeletal: She exhibits no edema or tenderness.  Neurological: She is alert and oriented to person, place, and time.  Skin: Skin is warm and dry. She is not diaphoretic.  Psychiatric: She has a normal mood and affect. Her behavior is normal.  Nursing note and vitals reviewed.    ED Treatments / Results  Labs (all labs ordered are listed, but only abnormal results are displayed) Labs Reviewed - No data to display  EKG None  Radiology No results found.  Procedures Procedures (including critical care time)  Medications Ordered in ED Medications  oxyCODONE (Oxy IR/ROXICODONE) immediate release tablet 5 mg (5 mg Oral  Given 04/17/18 0949)  acetaminophen (TYLENOL) tablet 1,000 mg (1,000 mg Oral Given 04/17/18 0949)  amoxicillin-clavulanate (AUGMENTIN) 875-125 MG per tablet 1 tablet (1 tablet Oral Given 04/17/18 0949)     Initial Impression / Assessment and Plan / ED Course  I have reviewed the triage vital signs and the nursing notes.  Pertinent labs & imaging results that were available during my care of the patient were reviewed by me and considered in my medical decision making (see chart for details).     67 yo F with a chief complaint of left lower quadrant abdominal pain.  This is a recurrent issue for her.  Has had diverticulitis in the past at least twice in her system.  She sees Dr. Matthias Hughs as an outpatient.  Most recent visit with him he thought if she were to have continued episodes she may need to see a Development worker, international aid.  Last CT scan was concerning for a possible fistula.  She denies any urinary complaints denies air in her urine or change of color to her urine.  She is well-appearing and nontoxic.  Has very minimal if any tenderness on her abdominal exam.  I discussed possible evaluation options including laboratory evaluation and CT imaging though I  suggested that typically this is seen as an outpatient and started on antibiotics for presumptive disease if this feels like her prior events.  Patient is electing for the latter.  Will call her gastroenterologist this morning.   9:50 AM:  I have discussed the diagnosis/risks/treatment options with the patient and family and believe the pt to be eligible for discharge home to follow-up with PCP, GI. We also discussed returning to the ED immediately if new or worsening sx occur. We discussed the sx which are most concerning (e.g., sudden worsening pain, fever, inability to tolerate by mouth) that necessitate immediate return. Medications administered to the patient during their visit and any new prescriptions provided to the patient are listed below.  Medications given during this visit Medications  oxyCODONE (Oxy IR/ROXICODONE) immediate release tablet 5 mg (5 mg Oral Given 04/17/18 0949)  acetaminophen (TYLENOL) tablet 1,000 mg (1,000 mg Oral Given 04/17/18 0949)  amoxicillin-clavulanate (AUGMENTIN) 875-125 MG per tablet 1 tablet (1 tablet Oral Given 04/17/18 0949)      The patient appears reasonably screen and/or stabilized for discharge and I doubt any other medical condition or other [facility] requiring further screening, evaluation, or treatment in the ED at this time prior to discharge.    Final Clinical Impressions(s) / ED Diagnoses   Final diagnoses:  Diverticulitis    ED Discharge Orders         Ordered    amoxicillin-clavulanate (AUGMENTIN) 875-125 MG tablet  Every 12 hours     04/17/18 0942    morphine (MSIR) 15 MG tablet  Every 4 hours PRN     04/17/18 0942           Melene Plan, DO 04/17/18 [phone]

## 2018-04-17 NOTE — ED Notes (Signed)
ED Provider at bedside. 

## 2018-04-17 NOTE — ED Triage Notes (Signed)
LLQ pain x3 days. Hx of Diverticulitis. Nausea this morning but no vomiting or diarrhea.  Sts this is what her diverticulitis flares feel like.

## 2018-04-25 ENCOUNTER — Other Ambulatory Visit: Payer: Self-pay | Admitting: Family Medicine

## 2018-04-25 DIAGNOSIS — Z1231 Encounter for screening mammogram for malignant neoplasm of breast: Secondary | ICD-10-CM

## 2018-05-03 ENCOUNTER — Emergency Department (HOSPITAL_COMMUNITY)
Admission: EM | Admit: 2018-05-03 | Discharge: 2018-05-03 | Disposition: A | Discharge status: Home / Self Care | Payer: BLUE CROSS/BLUE SHIELD | Attending: Emergency Medicine | Admitting: Emergency Medicine

## 2018-05-03 ENCOUNTER — Encounter (HOSPITAL_COMMUNITY): Payer: Self-pay

## 2018-05-03 ENCOUNTER — Emergency Department (HOSPITAL_COMMUNITY): Payer: BLUE CROSS/BLUE SHIELD

## 2018-05-03 ENCOUNTER — Other Ambulatory Visit: Payer: Self-pay

## 2018-05-03 DIAGNOSIS — I10 Essential (primary) hypertension: Secondary | ICD-10-CM | POA: Diagnosis not present

## 2018-05-03 DIAGNOSIS — Z79899 Other long term (current) drug therapy: Secondary | ICD-10-CM | POA: Diagnosis not present

## 2018-05-03 DIAGNOSIS — K5792 Diverticulitis of intestine, part unspecified, without perforation or abscess without bleeding: Secondary | ICD-10-CM | POA: Diagnosis not present

## 2018-05-03 DIAGNOSIS — R1032 Left lower quadrant pain: Secondary | ICD-10-CM | POA: Diagnosis present

## 2018-05-03 LAB — URINALYSIS, ROUTINE W REFLEX MICROSCOPIC
Bilirubin Urine: NEGATIVE
Glucose, UA: NEGATIVE mg/dL
Ketones, ur: NEGATIVE mg/dL
Nitrite: NEGATIVE
PROTEIN: NEGATIVE mg/dL
Specific Gravity, Urine: 1.011 (ref 1.005–1.030)
WBC, UA: >50 WBC/hpf — ABNORMAL HIGH (ref 0–5)
pH: 6 (ref 5.0–8.0)

## 2018-05-03 LAB — COMPREHENSIVE METABOLIC PANEL
ALT: 34 U/L (ref 0–44)
AST: 24 U/L (ref 15–41)
Albumin: 3.8 g/dL (ref 3.5–5.0)
Alkaline Phosphatase: 47 U/L (ref 38–126)
Anion gap: 11 (ref 5–15)
BUN: 15 mg/dL (ref 8–23)
CALCIUM: 8.8 mg/dL — AB (ref 8.9–10.3)
CHLORIDE: 105 mmol/L (ref 98–111)
CO2: 23 mmol/L (ref 22–32)
CREATININE: 0.63 mg/dL (ref 0.44–1.00)
GFR calc Af Amer: >60 mL/min (ref >60)
Glucose, Bld: 95 mg/dL (ref 70–99)
Potassium: 3.3 mmol/L — ABNORMAL LOW (ref 3.5–5.1)
Sodium: 139 mmol/L (ref 135–145)
Total Bilirubin: 0.7 mg/dL (ref 0.3–1.2)
Total Protein: 6.8 g/dL (ref 6.5–8.1)

## 2018-05-03 LAB — CBC
HEMATOCRIT: 41.5 % (ref 36.0–46.0)
Hemoglobin: 13.3 g/dL (ref 12.0–15.0)
MCH: 31.9 pg (ref 26.0–34.0)
MCHC: 32 g/dL (ref 30.0–36.0)
MCV: 99.5 fL (ref 80.0–100.0)
Platelets: 209 10*3/uL (ref 150–400)
RBC: 4.17 MIL/uL (ref 3.87–5.11)
RDW: 14 % (ref 11.5–15.5)
WBC: 8 10*3/uL (ref 4.0–10.5)
nRBC: 0 % (ref 0.0–0.2)

## 2018-05-03 LAB — LIPASE, BLOOD: LIPASE: 31 U/L (ref 11–51)

## 2018-05-03 MED ORDER — LACTATED RINGERS IV BOLUS
1000.0000 mL | Freq: Once | INTRAVENOUS | Status: AC
  Administered 2018-05-03: 1000 mL via INTRAVENOUS

## 2018-05-03 MED ORDER — FENTANYL CITRATE (PF) 100 MCG/2ML IJ SOLN
50.0000 ug | Freq: Once | INTRAMUSCULAR | Status: AC
  Administered 2018-05-03: 50 ug via INTRAVENOUS
  Filled 2018-05-03: qty 2

## 2018-05-03 MED ORDER — IOPAMIDOL (ISOVUE-300) INJECTION 61%
INTRAVENOUS | Status: AC
  Filled 2018-05-03: qty 100

## 2018-05-03 MED ORDER — IOHEXOL 300 MG/ML  SOLN
100.0000 mL | Freq: Once | INTRAMUSCULAR | Status: AC | PRN
  Administered 2018-05-03: 100 mL via INTRAVENOUS

## 2018-05-03 MED ORDER — ONDANSETRON HCL 4 MG/2ML IJ SOLN
4.0000 mg | Freq: Once | INTRAMUSCULAR | Status: AC
  Administered 2018-05-03: 4 mg via INTRAVENOUS
  Filled 2018-05-03: qty 2

## 2018-05-03 MED ORDER — AMOXICILLIN-POT CLAVULANATE 875-125 MG PO TABS: 1.0000 | ORAL_TABLET | Freq: Two times a day (BID) | ORAL | 0 refills | Status: AC

## 2018-05-03 NOTE — ED Triage Notes (Addendum)
Patient c/o left mid and left lower abdominal pain, nausea, and not able to empty bladder x 2 days and worsening today. Patient also c/o abdominal bloating.

## 2018-05-03 NOTE — ED Provider Notes (Signed)
[facility]-EMERGENCY DEPT Provider Note   CSN: [id number] Arrival date & time: 05/03/18  1130     History   Chief Complaint Chief Complaint  Patient presents with  . Abdominal Pain    HPI Shannon Reyes is a 67 y.o. female.  The history is provided by the patient.  Abdominal Pain   This is a recurrent problem. The current episode started 2 days ago. The problem occurs constantly. The problem has not changed since onset.The pain is associated with an unknown factor. The pain is located in the LLQ. The quality of the pain is aching, cramping and dull. The pain is at a severity of 2/10. The pain is mild. Associated symptoms include nausea, dysuria and frequency. Pertinent negatives include anorexia, fever, belching, diarrhea, flatus, melena, vomiting, constipation, hematuria, headaches, arthralgias and myalgias. Nothing aggravates the symptoms. Nothing relieves the symptoms. Past workup includes GI consult (hx of diverticulitis).    Past Medical History:  Diagnosis Date  . Coronary artery fistula   . Diverticulitis   . Diverticulosis   . GERD (gastroesophageal reflux disease)   . Hypertension   . Obesity   . Thyroid disease     Patient Active Problem List   Diagnosis Date Noted  . GERD (gastroesophageal reflux disease) 01/25/2013  . HTN (hypertension) 12/31/2012  . Murmur 12/31/2012  . 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 Elevated LDL cholesterol level 12/31/2012  . Preop cardiovascular exam 12/31/2012  . Morbid obesity-BMI 41 12/06/2012    Past Surgical History:  Procedure Laterality Date  . ABDOMINAL HYSTERECTOMY    . ANKLE SURGERY Right 2004/2014  . CATARACT EXTRACTION    . CESAREAN SECTION    . OVARIAN CYST REMOVAL       OB History   None      Home Medications    Prior to Admission medications   Medication Sig Start Date End Date Taking? Authorizing Provider  buPROPion (WELLBUTRIN XL) 300 MG 24 hr tablet Take 300 mg by mouth  daily.   Yes [provider]  estradiol (VIVELLE-DOT) 0.05 MG/24HR patch Place 0.05 mg onto the skin 2 (two) times a week. On Sunday and Wednesday 04/19/18  Yes [provider]  EVEKEO 10 MG TABS Take 10 mg by mouth daily as needed (ADHD).  03/20/18  Yes [provider]  hydrochlorothiazide (HYDRODIURIL) 25 MG tablet Take 25 mg by mouth daily.   Yes [provider]  HYDROcodone-acetaminophen (NORCO) 10-325 MG tablet Take 1 tablet by mouth 3 (three) times daily as needed. 03/30/18  Yes [provider]  lisinopril (PRINIVIL,ZESTRIL) 40 MG tablet Take 40 mg by mouth 2 (two) times daily.  04/12/18  Yes [provider]  MAGNESIUM PO Take 3 capsules by mouth at bedtime.   Yes [provider]  methocarbamol (ROBAXIN) 500 MG tablet Take 500 mg by mouth 2 (two) times daily as needed for muscle spasms.  02/08/18  Yes [provider]  metoprolol succinate (TOPROL-XL) 25 MG 24 hr tablet Take 25 mg by mouth daily. 04/13/18  Yes [provider]  Multiple Vitamins-Minerals (ZINC PO) Take 1 capsule by mouth daily.   Yes [provider]  progesterone (PROMETRIUM) 100 MG capsule Take 300 mg by mouth every evening.  03/22/18  Yes [provider]  saccharomyces boulardii (FLORASTOR) 250 MG capsule Take 1 capsule (250 mg total) by mouth 2 (two) times daily. 06/16/17  Yes Rise Mu, PA-C  Testosterone 10 MG/ACT (2%) GEL Apply 1 application  topically daily.    Yes [provider]  thyroid (ARMOUR) 180 MG tablet Take 180 mg by mouth daily.   Yes [provider]  amoxicillin-clavulanate (AUGMENTIN) 875-125 MG tablet Take 1 tablet by mouth 2 (two) times daily for 10 days. 05/03/18 05/13/18  Khali Albanese, DO  metoprolol tartrate (LOPRESSOR) 25 MG tablet Take 1 tablet (25 mg total) by mouth once. 04/06/16 04/06/16  Rosalio Macadamia, NP  morphine (MSIR) 15 MG tablet Take 1 tablet (15 mg total) by mouth every  4 (four) hours as needed for severe pain. Patient not taking: Reported on 05/03/2018 04/17/18   Melene Plan, DO    Family History Family History  Problem Relation Age of Onset  . Cancer Paternal Grandmother   . Breast cancer Neg Hx     Social History Social History   Tobacco Use  . Smoking status: Never Smoker  . Smokeless tobacco: Never Used  Substance Use Topics  . Alcohol use: Yes    Alcohol/week: 10.0 standard drinks    Types: 10 Glasses of wine per week    Comment: Vokda or wine daily  . Drug use: No     Allergies   Codeine   Review of Systems Review of Systems  Constitutional: Negative for chills and fever.  HENT: Negative for ear pain and sore throat.   Eyes: Negative for pain and visual disturbance.  Respiratory: Negative for cough and shortness of breath.   Cardiovascular: Negative for chest pain and palpitations.  Gastrointestinal: Positive for abdominal pain and nausea. Negative for anorexia, constipation, diarrhea, flatus, melena and vomiting.  Genitourinary: Positive for dysuria and frequency. Negative for hematuria.  Musculoskeletal: Negative for arthralgias, back pain and myalgias.  Skin: Negative for color change and rash.  Neurological: Negative for seizures, syncope and headaches.  All other systems reviewed and are negative.    Physical Exam Updated Vital Signs  ED Triage Vitals  Enc Vitals Group     BP 05/03/18 1144 (!) 171/108     Pulse Rate 05/03/18 1144 74     Resp 05/03/18 1144 18     Temp 05/03/18 1144 98.4 F (36.9 C)     Temp Source 05/03/18 1144 Oral     SpO2 05/03/18 1144 96 %     Weight 05/03/18 1144 215 lb (97.5 kg)     Height 05/03/18 1144 5\' 3"  (1.6 m)     Head Circumference --      Peak Flow --      Pain Score 05/03/18 1149 8     Pain Loc --      Pain Edu? --      Excl. in GC? --     Physical Exam  Constitutional: She appears well-developed and well-nourished. No distress.  HENT:  Head: Normocephalic and  atraumatic.  Eyes: Pupils are equal, round, and reactive to light. Conjunctivae and EOM are normal.  Neck: Neck supple.  Cardiovascular: Normal rate and regular rhythm.  No murmur heard. Pulmonary/Chest: Effort normal and breath sounds normal. No respiratory distress.  Abdominal: Soft. Normal appearance. There is tenderness in the left upper quadrant and left lower quadrant. There is guarding. There is no rigidity, no rebound, no CVA tenderness, no tenderness at McBurney's point and negative Murphy's sign.  Musculoskeletal: She exhibits no edema.  Neurological: She is alert.  Skin: Skin is warm and dry.  Psychiatric: She has a normal mood and affect.  Nursing note and vitals reviewed.    ED Treatments / Results  Labs (all labs ordered are listed, but only abnormal results are displayed) Labs Reviewed  COMPREHENSIVE METABOLIC PANEL - Abnormal; Notable for the following components:      Result Value   Potassium 3.3 (*)    Calcium 8.8 (*)    All other components within normal limits  URINALYSIS, ROUTINE W REFLEX MICROSCOPIC - Abnormal; Notable for the following components:   Hgb urine dipstick MODERATE (*)    Leukocytes, UA MODERATE (*)    WBC, UA >50 (*)    Bacteria, UA RARE (*)    All other components within normal limits  URINE CULTURE  LIPASE, BLOOD  CBC    EKG EKG Interpretation  Date/Time:  Thursday May 03 2018 12:16:37 EST Ventricular Rate:  70 PR Interval:    QRS Duration: 104 QT Interval:  423 QTC Calculation: 457 R Axis:   -41 Text Interpretation:  Sinus rhythm Left anterior fascicular block Low voltage, precordial leads Confirmed by Virgina NorfolkAdam, Mehar Sagen [phone]([id number]) on 05/03/2018 12:25:41 PM   Radiology Ct Abdomen Pelvis W Contrast  Result Date: 05/03/2018 CLINICAL DATA:  Patient c/o left mid and left lower abdominal pain, nausea, and not able to empty bladder x 2 days and worsening today. Patient also c/o abdominal bloating. Surgery-complete hysterectomy,  c-section Cancer-none EXAM: CT ABDOMEN AND PELVIS WITH CONTRAST TECHNIQUE: Multidetector CT imaging of the abdomen and pelvis was performed using the standard protocol following bolus administration of intravenous contrast. CONTRAST:  100mL OMNIPAQUE IOHEXOL 300 MG/ML  SOLN COMPARISON:  CT of the abdomen and pelvis on 06/16/2017 FINDINGS: Lower chest: There is minimal subsegmental atelectasis at the lung bases. Heart size is normal. No pericardial effusion or significant coronary artery calcifications. Hepatobiliary: No focal liver abnormality is seen. No radiopaque gallstones, biliary dilatation, or pericholecystic inflammatory changes. Pancreas: Unremarkable. No pancreatic ductal dilatation or surrounding inflammatory changes. Spleen: Normal in size without focal abnormality. Adrenals/Urinary Tract: Adrenal glands are normal in appearance. Kidneys are symmetric in enhancement and size. No hydronephrosis or ureteral obstruction. The bladder and visualized portion of the urethra are normal. Stomach/Bowel: The stomach and small bowel loops are normal in appearance. Appendix is normal in appearance. There are numerous colonic diverticula. There is inflammatory change in the region of the splenic flexure of the colon, likely representing acute diverticulitis. No associated abscess or perforation. Vascular/Lymphatic: Focal atherosclerotic calcification of the abdominal aorta. No retroperitoneal or mesenteric adenopathy. There is normal vascular opacification of the celiac axis, superior mesenteric artery, and inferior mesenteric artery. Normal appearance of the portal venous system and inferior vena cava. Reproductive: Hysterectomy.  No adnexal mass. Other: Small fat containing paraumbilical hernia.  No ascites. Musculoskeletal: No acute or significant osseous findings. IMPRESSION: 1. Acute diverticulitis of the splenic flexure. 2. Improved diverticulitis of the sigmoid colon. 3. Urinary tract unremarkable. 4. Aortic  atherosclerosis.  (ICD10-I70.0) 5. Hysterectomy. 6. Small fat containing paraumbilical hernia. Electronically Signed   By: Norva PavlovElizabeth  Brown M.D.   On: 05/03/2018 14:20    Procedures Procedures (including critical care time)  Medications Ordered in ED Medications  iopamidol (ISOVUE-300) 61 % injection (has no administration in time range)  lactated ringers bolus 1,000 mL (0 mLs Intravenous Stopped 05/03/18 1523)  ondansetron (ZOFRAN) injection 4 mg (4 mg Intravenous Given 05/03/18 1259)  fentaNYL (SUBLIMAZE) injection 50 mcg (50 mcg Intravenous Given 05/03/18 1259)  iohexol (OMNIPAQUE) 300 MG/ML solution 100 mL (100 mLs Intravenous Contrast Given 05/03/18 1350)     Initial Impression / Assessment and Plan / ED Course  I have reviewed  the triage vital signs and the nursing notes.  Pertinent labs & imaging results that were available during my care of the patient were reviewed by me and considered in my medical decision making (see chart for details).     Shannon Reyes is a 67 year old female history of diverticulitis, reflux, hypertension who presents to the ED with left-sided abdominal pain.  Patient states that pain feels similar to her prior diverticulitis.  Patient with normal vitals.  No fever.  Was instructed to come here for evaluation by her primary care doctor.  Patient denies any nausea, vomiting, diarrhea.  She is tender mostly in the left side of her abdomen.  No signs of peritonitis.  Patient denies any history of bowel obstruction.  No urinary symptoms.  Lab work was obtained that showed no significant leukocytosis, electrolyte abnormality, anemia, kidney injury.  CT scan of the abdomen and pelvis showed splenic flexure diverticulitis.  Patient has done well before and Augmentin and will start her on Augmentin.  Patient was concerned about some urinary retention however bladder scan showed less than 50 cc of urine.  Urinalysis is overall unremarkable but patient will be on  antibiotics anyway.  Recommend close follow-up with primary care doctor and discharged from ED in good condition.  Given return precautions.  At this time patient with pain that is well controlled, no nausea no vomiting.  Suspect the patient will do well on oral antibiotics.  This chart was dictated using voice recognition software.  Despite best efforts to proofread,  errors can occur which can change the documentation meaning.   Final Clinical Impressions(s) / ED Diagnoses   Final diagnoses:  Acute diverticulitis    ED Discharge Orders         Ordered    amoxicillin-clavulanate (AUGMENTIN) 875-125 MG tablet  2 times daily     05/03/18 1512           Virgina Norfolk, DO 05/03/18 1532

## 2018-05-06 LAB — URINE CULTURE: Culture: 70000 — AB

## 2018-05-07 ENCOUNTER — Telehealth: Payer: Self-pay | Admitting: Emergency Medicine

## 2018-05-07 NOTE — Telephone Encounter (Signed)
Post ED Visit - Positive Culture Follow-up  Culture report reviewed by antimicrobial stewardship pharmacist:  []  Enzo BiNathan Batchelder, Pharm.D. []  Celedonio MiyamotoJeremy Frens, Pharm.D., BCPS AQ-ID []  Garvin FilaMike Maccia, Pharm.D., BCPS []  Georgina PillionElizabeth Martin, Pharm.D., BCPS []  LawrenceMinh Pham, [address] BoulevardPharm.D., BCPS, AAHIVP []  Estella HuskMichelle Turner, Pharm.D., BCPS, AAHIVP []  Lysle Pearlachel Rumbarger, PharmD, BCPS []  Phillips Climeshuy Dang, PharmD, BCPS []  Agapito GamesAlison Masters, PharmD, BCPS [x]  Pola CornBen Mancheril, PharmD  Positive urine culture Treated with Augentin, organism sensitive to the same and no further patient follow-up is required at this time.  Carollee HerterShannon Turner Baillie 05/07/2018, 10:07 AM

## 2018-05-29 ENCOUNTER — Other Ambulatory Visit: Payer: Self-pay | Admitting: Urology

## 2018-06-08 ENCOUNTER — Ambulatory Visit: Payer: BLUE CROSS/BLUE SHIELD

## 2018-06-11 NOTE — Patient Instructions (Addendum)
Shannon Reyes  06/11/2018   Your procedure is scheduled on: 06-19-18  Report to [facility] Main  Entrance  Report to admitting at 600 AM    Call this number if you have problems the morning of surgery [phone]   Remember:  BRUSH YOUR TEETH MORNING OF SURGERY AND RINSE YOUR MOUTH OUT, NO CHEWING GUM CANDY OR MINTS.  NO SOLID FOOD AFTER MIDNIGHT Sunday NIGHT 06-17-18. BOWEL PREP ALL DAY Monday 06-18-18  THE NIGHT PRIOR TO SURGERY. NOTHING BY MOUTH EXCEPT CLEAR LIQUIDS UNTIL 3 HOURS PRIOR TO SCHEULED SURGERY. PLEASE FINISH ENSURE DRINK PER SURGEON ORDER 3 HOURS PRIOR TO SCHEDULED SURGERY TIME WHICH NEEDS TO BE COMPLETED AT 500 am.    CLEAR LIQUID DIET   Foods Allowed                                                                     Foods Excluded  Coffee and tea, regular and decaf                             liquids that you cannot  Plain Jell-O in any flavor                                             see through such as: Fruit ices (not with fruit pulp)                                     milk, soups, orange juice  Iced Popsicles                                    All solid food Carbonated beverages, regular and diet                                    Cranberry, grape and apple juices Sports drinks like Gatorade Lightly seasoned clear broth or consume(fat free) Sugar, honey syrup  Sample Menu Breakfast                                Lunch                                     Supper Cranberry juice                    Beef broth                            Chicken broth Jell-O  Grape juice                           Apple juice Coffee or tea                        Jell-O                                      Popsicle                                                Coffee or tea                        Coffee or tea  _____________________________________________________________________    Take these medicines the morning of  surgery with A SIP OF WATER: HYDROCODONE IF NEEDED, BUPROPION (WELLBUTRIN), METOPROLOL SUCCINATE (TOPROLOL XL) augmentin                               You may not have any metal on your body including hair pins and              piercings  Do not wear jewelry, make-up, lotions, powders or perfumes, deodorant             Do not wear nail polish.  Do not shave  48 hours prior to surgery.              Men may shave face and neck.   Do not bring valuables to the hospital. [facility] IS NOT             RESPONSIBLE   FOR VALUABLES.  Contacts, dentures or bridgework may not be worn into surgery.  Leave suitcase in the car. After surgery it may be brought to your room.                  Please read over the following fact sheets you were given: _____________________________________________________________________             [facility] Before surgery, you can play an important role.  Because skin is not sterile, your skin needs to be as free of germs as possible.  You can reduce the number of germs on your skin by washing with CHG (chlorahexidine gluconate) soap before surgery.  CHG is an antiseptic cleaner which kills germs and bonds with the skin to continue killing germs even after washing. Please DO NOT use if you have an allergy to CHG or antibacterial soaps.  If your skin becomes reddened/irritated stop using the CHG and inform your nurse when you arrive at Short Stay. Do not shave (including legs and underarms) for at least 48 hours prior to the first CHG shower.  You may shave your face/neck. Please follow these instructions carefully:  1.  Shower with CHG Soap the night before surgery and the  morning of Surgery.  2.  If you choose to wash your hair, wash your hair first as usual with your  normal  shampoo.  3.  After you shampoo, rinse your hair and body thoroughly to remove the  shampoo.  4.  Use CHG as you would any other liquid soap.   You can apply chg directly  to the skin and wash                       Gently with a scrungie or clean washcloth.  5.  Apply the CHG Soap to your body ONLY FROM THE NECK DOWN.   Do not use on face/ open                           Wound or open sores. Avoid contact with eyes, ears mouth and genitals (private parts).                       Wash face,  Genitals (private parts) with your normal soap.             6.  Wash thoroughly, paying special attention to the area where your surgery  will be performed.  7.  Thoroughly rinse your body with warm water from the neck down.  8.  DO NOT shower/wash with your normal soap after using and rinsing off  the CHG Soap.                9.  Pat yourself dry with a clean towel.            10.  Wear clean pajamas.            11.  Place clean sheets on your bed the night of your first shower and do not  sleep with pets. Day of Surgery : Do not apply any lotions/deodorants the morning of surgery.  Please wear clean clothes to the hospital/surgery center.  FAILURE TO FOLLOW THESE INSTRUCTIONS MAY RESULT IN THE CANCELLATION OF YOUR SURGERY PATIENT SIGNATURE_________________________________  NURSE SIGNATURE__________________________________  ________________________________________________________________________   Rogelia MireIncentive Spirometer  An incentive spirometer is a tool that can help keep your lungs clear and active. This tool measures how well you are filling your lungs with each breath. Taking long deep breaths may help reverse or decrease the chance of developing breathing (pulmonary) problems (especially infection) following:  A long period of time when you are unable to move or be active. BEFORE THE PROCEDURE   If the spirometer includes an indicator to show your best effort, your nurse or respiratory therapist will set it to a desired goal.  If possible, sit up straight or lean slightly forward. Try not to slouch.  Hold the incentive spirometer in an  upright position. INSTRUCTIONS FOR USE  1. Sit on the edge of your bed if possible, or sit up as far as you can in bed or on a chair. 2. Hold the incentive spirometer in an upright position. 3. Breathe out normally. 4. Place the mouthpiece in your mouth and seal your lips tightly around it. 5. Breathe in slowly and as deeply as possible, raising the piston or the ball toward the top of the column. 6. Hold your breath for 3-5 seconds or for as long as possible. Allow the piston or ball to fall to the bottom of the column. 7. Remove the mouthpiece from your mouth and breathe out normally. 8. Rest for a few seconds and repeat Steps 1 through 7 at least 10 times every 1-2 hours when you are awake. Take your time and take a few normal breaths between deep breaths. 9. The spirometer may include an indicator to  show your best effort. Use the indicator as a goal to work toward during each repetition. 10. After each set of 10 deep breaths, practice coughing to be sure your lungs are clear. If you have an incision (the cut made at the time of surgery), support your incision when coughing by placing a pillow or rolled up towels firmly against it. Once you are able to get out of bed, walk around indoors and cough well. You may stop using the incentive spirometer when instructed by your caregiver.  RISKS AND COMPLICATIONS  Take your time so you do not get dizzy or light-headed.  If you are in pain, you may need to take or ask for pain medication before doing incentive spirometry. It is harder to take a deep breath if you are having pain. AFTER USE  Rest and breathe slowly and easily.  It can be helpful to keep track of a log of your progress. Your caregiver can provide you with a simple table to help with this. If you are using the spirometer at home, follow these instructions: [place] IF:   You are having difficultly using the spirometer.  You have trouble using the spirometer as often as  instructed.  Your pain medication is not giving enough relief while using the spirometer.  You develop fever of 100.5 F (38.1 C) or higher. SEEK IMMEDIATE MEDICAL CARE IF:   You cough up bloody sputum that had not been present before.  You develop fever of 102 F (38.9 C) or greater.  You develop worsening pain at or near the incision site. MAKE SURE YOU:   Understand these instructions.  Will watch your condition.  Will get help right away if you are not doing well or get worse. Document Released: 10/10/2006 Document Revised: 08/22/2011 Document Reviewed: 12/11/2006 [facility] Patient Information 2014 [place], [place].   ________________________________________________________________________

## 2018-06-15 ENCOUNTER — Encounter (HOSPITAL_COMMUNITY)
Admission: RE | Admit: 2018-06-15 | Discharge: 2018-06-15 | Disposition: A | Discharge status: Home / Self Care | Payer: BLUE CROSS/BLUE SHIELD | Source: Ambulatory Visit | Attending: Surgery | Admitting: Surgery

## 2018-06-15 ENCOUNTER — Other Ambulatory Visit: Payer: Self-pay

## 2018-06-15 ENCOUNTER — Encounter (HOSPITAL_COMMUNITY): Payer: Self-pay

## 2018-06-15 DIAGNOSIS — Z01812 Encounter for preprocedural laboratory examination: Secondary | ICD-10-CM | POA: Diagnosis present

## 2018-06-15 DIAGNOSIS — K5792 Diverticulitis of intestine, part unspecified, without perforation or abscess without bleeding: Secondary | ICD-10-CM | POA: Diagnosis not present

## 2018-06-15 LAB — BASIC METABOLIC PANEL
Anion gap: 10 (ref 5–15)
BUN: 14 mg/dL (ref 8–23)
CO2: 26 mmol/L (ref 22–32)
Calcium: 9.3 mg/dL (ref 8.9–10.3)
Chloride: 103 mmol/L (ref 98–111)
Creatinine, Ser: 0.58 mg/dL (ref 0.44–1.00)
GFR calc Af Amer: >60 mL/min (ref >60)
GFR calc non Af Amer: >60 mL/min (ref >60)
GLUCOSE: 103 mg/dL — AB (ref 70–99)
Potassium: 4.2 mmol/L (ref 3.5–5.1)
Sodium: 139 mmol/L (ref 135–145)

## 2018-06-15 LAB — CBC
HEMATOCRIT: 43 % (ref 36.0–46.0)
Hemoglobin: 13.9 g/dL (ref 12.0–15.0)
MCH: 32.3 pg (ref 26.0–34.0)
MCHC: 32.3 g/dL (ref 30.0–36.0)
MCV: 99.8 fL (ref 80.0–100.0)
NRBC: 0 % (ref 0.0–0.2)
Platelets: 214 10*3/uL (ref 150–400)
RBC: 4.31 MIL/uL (ref 3.87–5.11)
RDW: 13.6 % (ref 11.5–15.5)
WBC: 5.7 10*3/uL (ref 4.0–10.5)

## 2018-06-15 LAB — HEMOGLOBIN A1C
Hgb A1c MFr Bld: 4.9 % (ref 4.8–5.6)
Mean Plasma Glucose: 93.93 mg/dL

## 2018-06-15 NOTE — Progress Notes (Signed)
Chart reviewed by Shanda Bumps zanetto pa, patient does not need cardiac clearance per Shanda Bumps zanetto pa. 05-03-18 ekg epic. Echo 04-26-16 epic

## 2018-06-18 MED ORDER — BUPIVACAINE LIPOSOME 1.3 % IJ SUSP
20.0000 mL | Freq: Once | INTRAMUSCULAR | Status: DC
  Filled 2018-06-18: qty 20

## 2018-06-18 NOTE — H&P (Signed)
Chief Complaint:  Recurrent diverticulitis  History of Present Illness:  Shannon Reyes is an 68 y.o. female referred by Dr. Matthias Hughs with diverticulitis of the splenic flexure and the sigmoid colon.  She was seen in the office and lap assisted colectomy discussed with her and her husband in detail.    Past Medical History:  Diagnosis Date  . Diverticulitis   . Diverticulosis   . GERD (gastroesophageal reflux disease) YRS AGO  . Hypertension   . Obesity   . Thyroid disease     Past Surgical History:  Procedure Laterality Date  . ABDOMINAL HYSTERECTOMY  1981   COMPLETE  . ANKLE SURGERY Right 2004/2014  . CATARACT EXTRACTION Bilateral    LEFT OCT 2018, RIGHT NOV 2019  . CESAREAN SECTION  1977 AND 1980  . OVARIAN CYST REMOVAL      Current Facility-Administered Medications  Medication Dose Route Frequency Provider Last Rate Last Dose  . [START ON 06/19/2018] bupivacaine liposome (EXPAREL) 1.3 % injection 266 mg  20 mL Infiltration Once Luretha Murphy, MD       Current Outpatient Medications  Medication Sig Dispense Refill  . amoxicillin-clavulanate (AUGMENTIN) 500-125 MG tablet Take 1 tablet by mouth 2 (two) times daily.    Marland Kitchen buPROPion (WELLBUTRIN XL) 300 MG 24 hr tablet Take 300 mg by mouth daily.    Marland Kitchen estradiol (VIVELLE-DOT) 0.05 MG/24HR patch Place 0.05 mg onto the skin 2 (two) times a week. On Sunday and Wednesday  2  . EVEKEO 10 MG TABS Take 10 mg by mouth daily as needed (ADHD).   0  . hydrochlorothiazide (HYDRODIURIL) 25 MG tablet Take 25 mg by mouth daily.    Marland Kitchen HYDROcodone-acetaminophen (NORCO) 10-325 MG tablet Take 1 tablet by mouth 3 (three) times daily as needed.  0  . lisinopril (PRINIVIL,ZESTRIL) 40 MG tablet Take 40 mg by mouth 2 (two) times daily.   2  . MAGNESIUM PO Take 3 capsules by mouth at bedtime.    . methocarbamol (ROBAXIN) 500 MG tablet Take 500 mg by mouth 2 (two) times daily as needed for muscle spasms.   0  . metoprolol succinate (TOPROL-XL) 25 MG 24 hr  tablet Take 25 mg by mouth daily.  1  . metoprolol tartrate (LOPRESSOR) 25 MG tablet Take 1 tablet (25 mg total) by mouth once. 1 tablet 3  . morphine (MSIR) 15 MG tablet Take 1 tablet (15 mg total) by mouth every 4 (four) hours as needed for severe pain. (Patient not taking: Reported on 05/03/2018) 4 tablet 0  . Multiple Vitamins-Minerals (ZINC PO) Take 1 capsule by mouth daily.    . progesterone (PROMETRIUM) 100 MG capsule Take 300 mg by mouth every evening.   2  . saccharomyces boulardii (FLORASTOR) 250 MG capsule Take 1 capsule (250 mg total) by mouth 2 (two) times daily. 30 capsule 0  . Testosterone 10 MG/ACT (2%) GEL Apply 1 application topically daily.     Marland Kitchen thyroid (ARMOUR) 180 MG tablet Take 180 mg by mouth daily.     Codeine Family History  Problem Relation Age of Onset  . Cancer Paternal Grandmother   . Breast cancer Neg Hx    Social History:   reports that she has never smoked. She has never used smokeless tobacco. She reports current alcohol use of about 10.0 standard drinks of alcohol per week. She reports that she does not use drugs.   REVIEW OF SYSTEMS : Negative except for see problem list  Physical Exam:  There were no vitals taken for this visit. There is no height or weight on file to calculate BMI.  Gen:  WDWN WF NAD  Neurological: Alert and oriented to person, place, and time. Motor and sensory function is grossly intact  Head: Normocephalic and atraumatic.  Eyes: Conjunctivae are normal. Pupils are equal, round, and reactive to light. No scleral icterus.  Neck: Normal range of motion. Neck supple. No tracheal deviation or thyromegaly present.  Cardiovascular:  SR without murmurs or gallops.  No carotid bruits Breast:  Not examined Respiratory: Effort normal.  No respiratory distress. No chest wall tenderness. Breath sounds normal.  No wheezes, rales or rhonchi.  Abdomen:  Limited tenderness along the left side GU:  Prior Pffaninstiel Musculoskeletal: Normal  range of motion. Extremities are nontender. No cyanosis, edema or clubbing noted Lymphadenopathy: No cervical, preauricular, postauricular or axillary adenopathy is present Skin: Skin is warm and dry. No rash noted. No diaphoresis. No erythema. No pallor. Pscyh: Normal mood and affect. Behavior is normal. Judgment and thought content normal.   LABORATORY RESULTS: No results found for this or any previous visit (from the past 48 hour(s)).   RADIOLOGY RESULTS: No results found.  Problem List: Patient Active Problem List   Diagnosis Date Noted  . GERD (gastroesophageal reflux disease) 01/25/2013  . HTN (hypertension) 12/31/2012  . Murmur 12/31/2012  . 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 Elevated LDL cholesterol level 12/31/2012  . Preop cardiovascular exam 12/31/2012  . Morbid obesity-BMI 41 12/06/2012    Assessment & Plan: History of diverticulitis for colectomy    Matt B. Daphine Deutscher, MD, [facility] [phone] beeper [phone]  06/18/2018 3:09 PM

## 2018-06-18 NOTE — Anesthesia Preprocedure Evaluation (Addendum)
Anesthesia Evaluation  Patient identified by MRN, date of birth, ID band Patient awake    Reviewed: Allergy & Precautions, NPO status , Patient's Chart, lab work & pertinent test results, reviewed documented beta blocker date and time   History of Anesthesia Complications Negative for: history of anesthetic complications  Airway Mallampati: I  TM Distance: >3 FB Neck ROM: Full    Dental  (+) Dental Advisory Given   Pulmonary neg pulmonary ROS,    breath sounds clear to auscultation       Cardiovascular hypertension, Pt. on medications and Pt. on home beta blockers (-) angina Rhythm:Regular Rate:Normal  '17 ECHO: EF 55-60%, mild pulm HTN   Neuro/Psych negative neurological ROS     GI/Hepatic Neg liver ROS, GERD  Controlled,  Endo/Other  Morbid obesity  Renal/GU negative Renal ROS     Musculoskeletal   Abdominal (+) + obese,   Peds  Hematology negative hematology ROS (+)   Anesthesia Other Findings   Reproductive/Obstetrics                            Anesthesia Physical Anesthesia Plan  ASA: III  Anesthesia Plan: General   Post-op Pain Management:    Induction: Intravenous  PONV Risk Score and Plan: 4 or greater and Scopolamine patch - Pre-op, Ondansetron and Dexamethasone  Airway Management Planned: Oral ETT  Additional Equipment:   Intra-op Plan:   Post-operative Plan: Extubation in OR  Informed Consent: I have reviewed the patients History and Physical, chart, labs and discussed the procedure including the risks, benefits and alternatives for the proposed anesthesia with the patient or authorized representative who has indicated his/her understanding and acceptance.   Dental advisory given  Plan Discussed with: CRNA and Surgeon  Anesthesia Plan Comments: (Plan routine monitors, GETA)       Anesthesia Quick Evaluation

## 2018-06-19 ENCOUNTER — Encounter (HOSPITAL_COMMUNITY)
Admission: RE | Disposition: A | Discharge status: Home / Self Care | Payer: Self-pay | Source: Home / Self Care | Attending: Surgery

## 2018-06-19 ENCOUNTER — Inpatient Hospital Stay (HOSPITAL_COMMUNITY)
Admission: RE | Admit: 2018-06-19 | Discharge: 2018-06-23 | DRG: 331 | Disposition: A | Discharge status: Home / Self Care | Payer: BLUE CROSS/BLUE SHIELD | Attending: Surgery | Admitting: Surgery

## 2018-06-19 ENCOUNTER — Inpatient Hospital Stay (HOSPITAL_COMMUNITY): Payer: BLUE CROSS/BLUE SHIELD | Admitting: Anesthesiology

## 2018-06-19 ENCOUNTER — Encounter (HOSPITAL_COMMUNITY): Payer: Self-pay | Admitting: *Deleted

## 2018-06-19 ENCOUNTER — Inpatient Hospital Stay (HOSPITAL_COMMUNITY): Payer: BLUE CROSS/BLUE SHIELD | Admitting: Physician Assistant

## 2018-06-19 ENCOUNTER — Inpatient Hospital Stay (HOSPITAL_COMMUNITY): Payer: BLUE CROSS/BLUE SHIELD

## 2018-06-19 ENCOUNTER — Other Ambulatory Visit: Payer: Self-pay

## 2018-06-19 DIAGNOSIS — Z79890 Hormone replacement therapy (postmenopausal): Secondary | ICD-10-CM

## 2018-06-19 DIAGNOSIS — I10 Essential (primary) hypertension: Secondary | ICD-10-CM | POA: Diagnosis present

## 2018-06-19 DIAGNOSIS — K219 Gastro-esophageal reflux disease without esophagitis: Secondary | ICD-10-CM | POA: Diagnosis present

## 2018-06-19 DIAGNOSIS — K5732 Diverticulitis of large intestine without perforation or abscess without bleeding: Secondary | ICD-10-CM

## 2018-06-19 DIAGNOSIS — Z9842 Cataract extraction status, left eye: Secondary | ICD-10-CM | POA: Diagnosis not present

## 2018-06-19 DIAGNOSIS — Z961 Presence of intraocular lens: Secondary | ICD-10-CM | POA: Diagnosis present

## 2018-06-19 DIAGNOSIS — Z90710 Acquired absence of both cervix and uterus: Secondary | ICD-10-CM | POA: Diagnosis not present

## 2018-06-19 DIAGNOSIS — Z23 Encounter for immunization: Secondary | ICD-10-CM

## 2018-06-19 DIAGNOSIS — K5792 Diverticulitis of intestine, part unspecified, without perforation or abscess without bleeding: Secondary | ICD-10-CM | POA: Diagnosis present

## 2018-06-19 DIAGNOSIS — Z9841 Cataract extraction status, right eye: Secondary | ICD-10-CM

## 2018-06-19 DIAGNOSIS — Z9049 Acquired absence of other specified parts of digestive tract: Secondary | ICD-10-CM

## 2018-06-19 DIAGNOSIS — Z79899 Other long term (current) drug therapy: Secondary | ICD-10-CM

## 2018-06-19 HISTORY — PX: COLON RESECTION: SHX5231

## 2018-06-19 HISTORY — PX: CYSTOSCOPY WITH STENT PLACEMENT: SHX5790

## 2018-06-19 LAB — CBC
HCT: 43.5 % (ref 36.0–46.0)
Hemoglobin: 13.9 g/dL (ref 12.0–15.0)
MCH: 32.3 pg (ref 26.0–34.0)
MCHC: 32 g/dL (ref 30.0–36.0)
MCV: 100.9 fL — ABNORMAL HIGH (ref 80.0–100.0)
NRBC: 0 % (ref 0.0–0.2)
Platelets: 213 10*3/uL (ref 150–400)
RBC: 4.31 MIL/uL (ref 3.87–5.11)
RDW: 13.9 % (ref 11.5–15.5)
WBC: 15.2 10*3/uL — AB (ref 4.0–10.5)

## 2018-06-19 LAB — CREATININE, SERUM
Creatinine, Ser: 0.67 mg/dL (ref 0.44–1.00)
GFR calc Af Amer: >60 mL/min (ref >60)

## 2018-06-19 SURGERY — COLECTOMY, LEFT, LAPAROSCOPIC
Anesthesia: General | Site: Ureter | Laterality: Left

## 2018-06-19 MED ORDER — MIDAZOLAM HCL 5 MG/5ML IJ SOLN
INTRAMUSCULAR | Status: DC | PRN
  Administered 2018-06-19: 2 mg via INTRAVENOUS

## 2018-06-19 MED ORDER — FENTANYL CITRATE (PF) 100 MCG/2ML IJ SOLN
INTRAMUSCULAR | Status: AC
  Filled 2018-06-19: qty 2

## 2018-06-19 MED ORDER — LIDOCAINE 2% (20 MG/ML) 5 ML SYRINGE
INTRAMUSCULAR | Status: AC
  Filled 2018-06-19: qty 5

## 2018-06-19 MED ORDER — SODIUM CHLORIDE 0.9 % IV SOLN
2.0000 g | INTRAVENOUS | Status: AC
  Administered 2018-06-19: 2 g via INTRAVENOUS
  Filled 2018-06-19: qty 2

## 2018-06-19 MED ORDER — ALVIMOPAN 12 MG PO CAPS
12.0000 mg | ORAL_CAPSULE | ORAL | Status: AC
  Administered 2018-06-19: 12 mg via ORAL
  Filled 2018-06-19: qty 1

## 2018-06-19 MED ORDER — SCOPOLAMINE 1 MG/3DAYS TD PT72
1.0000 | MEDICATED_PATCH | Freq: Once | TRANSDERMAL | Status: DC
  Administered 2018-06-19: 1.5 mg via TRANSDERMAL
  Filled 2018-06-19: qty 1

## 2018-06-19 MED ORDER — ACETAMINOPHEN 500 MG PO TABS
1000.0000 mg | ORAL_TABLET | ORAL | Status: AC
  Administered 2018-06-19: 1000 mg via ORAL
  Filled 2018-06-19: qty 2

## 2018-06-19 MED ORDER — SODIUM CHLORIDE 0.9 % IV SOLN
2.0000 g | Freq: Two times a day (BID) | INTRAVENOUS | Status: AC
  Administered 2018-06-19: 2 g via INTRAVENOUS
  Filled 2018-06-19: qty 2

## 2018-06-19 MED ORDER — PROPOFOL 10 MG/ML IV BOLUS
INTRAVENOUS | Status: DC | PRN
  Administered 2018-06-19: 120 mg via INTRAVENOUS

## 2018-06-19 MED ORDER — METOPROLOL SUCCINATE ER 25 MG PO TB24
25.0000 mg | ORAL_TABLET | Freq: Every day | ORAL | Status: DC
  Administered 2018-06-20 – 2018-06-23 (×4): 25 mg via ORAL
  Filled 2018-06-19 (×6): qty 1

## 2018-06-19 MED ORDER — ROCURONIUM BROMIDE 10 MG/ML (PF) SYRINGE
PREFILLED_SYRINGE | INTRAVENOUS | Status: DC | PRN
  Administered 2018-06-19 (×2): 20 mg via INTRAVENOUS
  Administered 2018-06-19: 30 mg via INTRAVENOUS
  Administered 2018-06-19: 20 mg via INTRAVENOUS
  Administered 2018-06-19: 50 mg via INTRAVENOUS
  Administered 2018-06-19 (×2): 20 mg via INTRAVENOUS

## 2018-06-19 MED ORDER — LACTATED RINGERS IV SOLN
INTRAVENOUS | Status: DC
  Administered 2018-06-19 (×2): via INTRAVENOUS

## 2018-06-19 MED ORDER — PROMETHAZINE HCL 25 MG/ML IJ SOLN: 6.2500 mg | INTRAMUSCULAR | Status: DC | PRN

## 2018-06-19 MED ORDER — METOPROLOL TARTRATE 5 MG/5ML IV SOLN
5.0000 mg | Freq: Four times a day (QID) | INTRAVENOUS | Status: DC
  Administered 2018-06-20 – 2018-06-23 (×11): 5 mg via INTRAVENOUS
  Filled 2018-06-19 (×11): qty 5

## 2018-06-19 MED ORDER — HYDROMORPHONE HCL 1 MG/ML IJ SOLN
INTRAMUSCULAR | Status: AC
  Administered 2018-06-19: 0.5 mg via INTRAVENOUS
  Filled 2018-06-19: qty 1

## 2018-06-19 MED ORDER — HYDROMORPHONE HCL 1 MG/ML IJ SOLN
INTRAMUSCULAR | Status: AC
  Filled 2018-06-19: qty 1

## 2018-06-19 MED ORDER — SUGAMMADEX SODIUM 500 MG/5ML IV SOLN
INTRAVENOUS | Status: DC | PRN
  Administered 2018-06-19: 300 mg via INTRAVENOUS

## 2018-06-19 MED ORDER — CHLORHEXIDINE GLUCONATE CLOTH 2 % EX PADS: 6.0000 | MEDICATED_PAD | Freq: Once | CUTANEOUS | Status: DC

## 2018-06-19 MED ORDER — ALVIMOPAN 12 MG PO CAPS
12.0000 mg | ORAL_CAPSULE | Freq: Two times a day (BID) | ORAL | Status: DC
  Administered 2018-06-20 – 2018-06-22 (×6): 12 mg via ORAL
  Filled 2018-06-19 (×7): qty 1

## 2018-06-19 MED ORDER — SUGAMMADEX SODIUM 500 MG/5ML IV SOLN
INTRAVENOUS | Status: AC
  Filled 2018-06-19: qty 5

## 2018-06-19 MED ORDER — PHENYLEPHRINE 40 MCG/ML (10ML) SYRINGE FOR IV PUSH (FOR BLOOD PRESSURE SUPPORT)
PREFILLED_SYRINGE | INTRAVENOUS | Status: DC | PRN
  Administered 2018-06-19: 80 ug via INTRAVENOUS

## 2018-06-19 MED ORDER — ONDANSETRON HCL 4 MG/2ML IJ SOLN
INTRAMUSCULAR | Status: AC
  Filled 2018-06-19: qty 2

## 2018-06-19 MED ORDER — ROCURONIUM BROMIDE 10 MG/ML (PF) SYRINGE
PREFILLED_SYRINGE | INTRAVENOUS | Status: AC
  Filled 2018-06-19: qty 10

## 2018-06-19 MED ORDER — ONDANSETRON HCL 4 MG PO TABS: 4.0000 mg | ORAL_TABLET | Freq: Four times a day (QID) | ORAL | Status: DC | PRN

## 2018-06-19 MED ORDER — KCL IN DEXTROSE-NACL 20-5-0.45 MEQ/L-%-% IV SOLN
INTRAVENOUS | Status: DC
  Administered 2018-06-19 – 2018-06-22 (×6): via INTRAVENOUS
  Filled 2018-06-19 (×6): qty 1000

## 2018-06-19 MED ORDER — ONDANSETRON HCL 4 MG/2ML IJ SOLN
INTRAMUSCULAR | Status: DC | PRN
  Administered 2018-06-19: 4 mg via INTRAVENOUS

## 2018-06-19 MED ORDER — PROPOFOL 10 MG/ML IV BOLUS
INTRAVENOUS | Status: AC
  Filled 2018-06-19: qty 20

## 2018-06-19 MED ORDER — DEXAMETHASONE SODIUM PHOSPHATE 10 MG/ML IJ SOLN
INTRAMUSCULAR | Status: DC | PRN
  Administered 2018-06-19: 8 mg via INTRAVENOUS

## 2018-06-19 MED ORDER — METOPROLOL TARTRATE 5 MG/5ML IV SOLN: 5.0000 mg | Freq: Four times a day (QID) | INTRAVENOUS | Status: DC | PRN

## 2018-06-19 MED ORDER — MEPERIDINE HCL 50 MG/ML IJ SOLN: 6.2500 mg | INTRAMUSCULAR | Status: DC | PRN

## 2018-06-19 MED ORDER — LACTATED RINGERS IR SOLN
Status: DC | PRN
  Administered 2018-06-19: 1000 mL

## 2018-06-19 MED ORDER — LIDOCAINE HCL 2 % IJ SOLN
INTRAMUSCULAR | Status: AC
  Filled 2018-06-19: qty 40

## 2018-06-19 MED ORDER — HYDROCODONE-ACETAMINOPHEN 5-325 MG PO TABS
1.0000 | ORAL_TABLET | ORAL | Status: DC | PRN
  Administered 2018-06-19 – 2018-06-23 (×15): 2 via ORAL
  Filled 2018-06-19 (×15): qty 2

## 2018-06-19 MED ORDER — ONDANSETRON HCL 4 MG/2ML IJ SOLN
4.0000 mg | Freq: Four times a day (QID) | INTRAMUSCULAR | Status: DC | PRN
  Administered 2018-06-21: 4 mg via INTRAVENOUS
  Filled 2018-06-19: qty 2

## 2018-06-19 MED ORDER — SACCHAROMYCES BOULARDII 250 MG PO CAPS
250.0000 mg | ORAL_CAPSULE | Freq: Two times a day (BID) | ORAL | Status: DC
  Administered 2018-06-19 – 2018-06-23 (×8): 250 mg via ORAL
  Filled 2018-06-19 (×8): qty 1

## 2018-06-19 MED ORDER — 0.9 % SODIUM CHLORIDE (POUR BTL) OPTIME
TOPICAL | Status: DC | PRN
  Administered 2018-06-19: 2000 mL

## 2018-06-19 MED ORDER — KETAMINE HCL 10 MG/ML IJ SOLN
INTRAMUSCULAR | Status: AC
  Filled 2018-06-19: qty 1

## 2018-06-19 MED ORDER — LIDOCAINE 2% (20 MG/ML) 5 ML SYRINGE
INTRAMUSCULAR | Status: DC | PRN
  Administered 2018-06-19: 40 mg via INTRAVENOUS

## 2018-06-19 MED ORDER — IOPAMIDOL (ISOVUE-300) INJECTION 61%
INTRAVENOUS | Status: AC
  Filled 2018-06-19: qty 50

## 2018-06-19 MED ORDER — SODIUM CHLORIDE (PF) 0.9 % IJ SOLN
INTRAMUSCULAR | Status: DC | PRN
  Administered 2018-06-19: 10 mL

## 2018-06-19 MED ORDER — FENTANYL CITRATE (PF) 250 MCG/5ML IJ SOLN
INTRAMUSCULAR | Status: AC
  Filled 2018-06-19: qty 5

## 2018-06-19 MED ORDER — SODIUM CHLORIDE (PF) 0.9 % IJ SOLN
INTRAMUSCULAR | Status: AC
  Filled 2018-06-19: qty 10

## 2018-06-19 MED ORDER — HEPARIN SODIUM (PORCINE) 5000 UNIT/ML IJ SOLN
5000.0000 [IU] | Freq: Three times a day (TID) | INTRAMUSCULAR | Status: DC
  Administered 2018-06-20 – 2018-06-23 (×8): 5000 [IU] via SUBCUTANEOUS
  Filled 2018-06-19 (×8): qty 1

## 2018-06-19 MED ORDER — BUPIVACAINE LIPOSOME 1.3 % IJ SUSP
INTRAMUSCULAR | Status: DC | PRN
  Administered 2018-06-19: 20 mL

## 2018-06-19 MED ORDER — DEXAMETHASONE SODIUM PHOSPHATE 10 MG/ML IJ SOLN
INTRAMUSCULAR | Status: AC
  Filled 2018-06-19: qty 1

## 2018-06-19 MED ORDER — HYDROMORPHONE HCL 1 MG/ML IJ SOLN
0.2500 mg | INTRAMUSCULAR | Status: DC | PRN
  Administered 2018-06-19 (×4): 0.5 mg via INTRAVENOUS

## 2018-06-19 MED ORDER — FENTANYL CITRATE (PF) 100 MCG/2ML IJ SOLN: 12.5000 ug | INTRAMUSCULAR | Status: DC | PRN

## 2018-06-19 MED ORDER — HYDRALAZINE HCL 20 MG/ML IJ SOLN: 10.0000 mg | INTRAMUSCULAR | Status: DC | PRN

## 2018-06-19 MED ORDER — METOPROLOL TARTRATE 25 MG PO TABS: 25.0000 mg | ORAL_TABLET | Freq: Once | ORAL | Status: DC

## 2018-06-19 MED ORDER — LIDOCAINE 20MG/ML (2%) 15 ML SYRINGE OPTIME
INTRAMUSCULAR | Status: DC | PRN
  Administered 2018-06-19: 1.5 mg/kg/h via INTRAVENOUS

## 2018-06-19 MED ORDER — KETAMINE HCL 10 MG/ML IJ SOLN
INTRAMUSCULAR | Status: DC | PRN
  Administered 2018-06-19: 10 mg via INTRAVENOUS
  Administered 2018-06-19: 25 mg via INTRAVENOUS

## 2018-06-19 MED ORDER — MIDAZOLAM HCL 2 MG/2ML IJ SOLN: 0.5000 mg | Freq: Once | INTRAMUSCULAR | Status: DC | PRN

## 2018-06-19 MED ORDER — PHENYLEPHRINE 40 MCG/ML (10ML) SYRINGE FOR IV PUSH (FOR BLOOD PRESSURE SUPPORT)
PREFILLED_SYRINGE | INTRAVENOUS | Status: AC
  Filled 2018-06-19: qty 10

## 2018-06-19 MED ORDER — FENTANYL CITRATE (PF) 250 MCG/5ML IJ SOLN
INTRAMUSCULAR | Status: DC | PRN
  Administered 2018-06-19: 50 ug via INTRAVENOUS
  Administered 2018-06-19: 100 ug via INTRAVENOUS
  Administered 2018-06-19: 50 ug via INTRAVENOUS
  Administered 2018-06-19: 100 ug via INTRAVENOUS
  Administered 2018-06-19: 50 ug via INTRAVENOUS

## 2018-06-19 MED ORDER — MIDAZOLAM HCL 2 MG/2ML IJ SOLN
INTRAMUSCULAR | Status: AC
  Filled 2018-06-19: qty 2

## 2018-06-19 SURGICAL SUPPLY — 75 items
ADAPTER GOLDBERG URETERAL (ADAPTER) ×1 IMPLANT
ADPR CATH 15X14FR FL DRN BG (ADAPTER) ×2
APPLIER CLIP 5 13 M/L LIGAMAX5 (MISCELLANEOUS)
APPLIER CLIP ROT 10 11.4 M/L (STAPLE) ×3
APR CLP MED LRG 11.4X10 (STAPLE) ×2
APR CLP MED LRG 5 ANG JAW (MISCELLANEOUS)
BAG URO CATCHER STRL LF (MISCELLANEOUS) ×3 IMPLANT
BLADE EXTENDED COATED 6.5IN (ELECTRODE) IMPLANT
CABLE HIGH FREQUENCY MONO STRZ (ELECTRODE) ×2 IMPLANT
CATH URET 5FR 28IN OPEN ENDED (CATHETERS) ×2 IMPLANT
CELLS DAT CNTRL 66122 CELL SVR (MISCELLANEOUS) IMPLANT
CHLORAPREP W/TINT 26ML (MISCELLANEOUS) ×3 IMPLANT
CLIP APPLIE 5 13 M/L LIGAMAX5 (MISCELLANEOUS) IMPLANT
CLIP APPLIE ROT 10 11.4 M/L (STAPLE) IMPLANT
CLOTH BEACON ORANGE TIMEOUT ST (SAFETY) ×2 IMPLANT
COUNTER NEEDLE 20 DBL MAG RED (NEEDLE) ×3 IMPLANT
COVER MAYO STAND STRL (DRAPES) ×9 IMPLANT
COVER SURGICAL LIGHT HANDLE (MISCELLANEOUS) ×3 IMPLANT
COVER WAND RF STERILE (DRAPES) ×1 IMPLANT
DECANTER SPIKE VIAL GLASS SM (MISCELLANEOUS) ×3 IMPLANT
DRAPE LAPAROSCOPIC ABDOMINAL (DRAPES) ×2 IMPLANT
DRSG OPSITE POSTOP 4X10 (GAUZE/BANDAGES/DRESSINGS) ×1 IMPLANT
DRSG OPSITE POSTOP 4X6 (GAUZE/BANDAGES/DRESSINGS) IMPLANT
DRSG OPSITE POSTOP 4X8 (GAUZE/BANDAGES/DRESSINGS) IMPLANT
ELECT REM PT RETURN 15FT ADLT (MISCELLANEOUS) ×3 IMPLANT
ENDO GIA UNIVERSAL XLG (ENDOMECHANICALS) ×1 IMPLANT
GAUZE SPONGE 2X2 8PLY STRL LF (GAUZE/BANDAGES/DRESSINGS) IMPLANT
GAUZE SPONGE 4X4 12PLY STRL (GAUZE/BANDAGES/DRESSINGS) IMPLANT
GLOVE BIOGEL M 8.0 STRL (GLOVE) ×6 IMPLANT
GLOVE SURG SS PI 8.0 STRL IVOR (GLOVE) ×1 IMPLANT
GOWN STRL REUS W/TWL XL LVL3 (GOWN DISPOSABLE) ×24 IMPLANT
GUIDEWIRE STR DUAL SENSOR (WIRE) ×3 IMPLANT
LEGGING LITHOTOMY PAIR STRL (DRAPES) IMPLANT
MANIFOLD NEPTUNE II (INSTRUMENTS) ×3 IMPLANT
PACK COLON (CUSTOM PROCEDURE TRAY) ×3 IMPLANT
PACK CYSTO (CUSTOM PROCEDURE TRAY) ×3 IMPLANT
PAD POSITIONING PINK XL (MISCELLANEOUS) ×3 IMPLANT
PORT LAP GEL ALEXIS MED 5-9CM (MISCELLANEOUS) IMPLANT
PROTECTOR NERVE ULNAR (MISCELLANEOUS) ×6 IMPLANT
RELOAD EGIA 60 MED/THCK PURPLE (STAPLE) ×3 IMPLANT
RELOAD EGIA 60 TAN VASC (STAPLE) ×1 IMPLANT
RELOAD STAPLE 60 MED/THCK ART (STAPLE) IMPLANT
RETRACTOR WND ALEXIS 18 MED (MISCELLANEOUS) IMPLANT
RTRCTR WOUND ALEXIS 18CM MED (MISCELLANEOUS)
SCISSORS LAP 5X45 EPIX DISP (ENDOMECHANICALS) ×3 IMPLANT
SET IRRIG TUBING LAPAROSCOPIC (IRRIGATION / IRRIGATOR) ×3 IMPLANT
SLEEVE XCEL OPT CAN 5 100 (ENDOMECHANICALS) ×6 IMPLANT
SPONGE GAUZE 2X2 STER 10/PKG (GAUZE/BANDAGES/DRESSINGS) ×1
STAPLER ECHELON POWER CIR 29 (STAPLE) ×1 IMPLANT
STAPLER VISISTAT 35W (STAPLE) ×3 IMPLANT
SUT CHROMIC 3 0 SH 27 (SUTURE) IMPLANT
SUT NOVA 1 T20/GS 25DT (SUTURE) ×3 IMPLANT
SUT PDS AB 1 CTX 36 (SUTURE) IMPLANT
SUT PDS AB 1 TP1 96 (SUTURE) IMPLANT
SUT PDS AB 4-0 SH 27 (SUTURE) IMPLANT
SUT PROLENE 2 0 KS (SUTURE) ×1 IMPLANT
SUT SILK 2 0 (SUTURE) ×3
SUT SILK 2 0 SH CR/8 (SUTURE) ×3 IMPLANT
SUT SILK 2-0 18XBRD TIE 12 (SUTURE) ×2 IMPLANT
SUT SILK 3 0 (SUTURE) ×3
SUT SILK 3 0 SH CR/8 (SUTURE) ×3 IMPLANT
SUT SILK 3-0 18XBRD TIE 12 (SUTURE) ×2 IMPLANT
SUT VIC AB 0 CT1 36 (SUTURE) ×1 IMPLANT
SUT VIC AB 4-0 SH 18 (SUTURE) ×3 IMPLANT
SYS LAPSCP GELPORT 120MM (MISCELLANEOUS)
SYSTEM LAPSCP GELPORT 120MM (MISCELLANEOUS) IMPLANT
TAPE CLOTH SURG 4X10 WHT LF (GAUZE/BANDAGES/DRESSINGS) ×1 IMPLANT
TOWEL OR NON WOVEN STRL DISP B (DISPOSABLE) ×3 IMPLANT
TRAY FOLEY MTR SLVR 14FR STAT (SET/KITS/TRAYS/PACK) ×1 IMPLANT
TRAY FOLEY MTR SLVR 16FR STAT (SET/KITS/TRAYS/PACK) IMPLANT
TROCAR BLADELESS OPT 5 100 (ENDOMECHANICALS) ×3 IMPLANT
TROCAR XCEL 12X100 BLDLESS (ENDOMECHANICALS) ×1 IMPLANT
TROCAR XCEL NON-BLD 11X100MML (ENDOMECHANICALS) IMPLANT
TUBING CONNECTING 10 (TUBING) ×5 IMPLANT
TUBING INSUF HEATED (TUBING) ×3 IMPLANT

## 2018-06-19 NOTE — H&P (Signed)
I was asked to placed stents prior to a colon resection by Dr. Daphine Deutscher.  I have reviewed the patient's history and discussed the procedure with the patient.  I reviewed the risks, including bleeding, infection, ureteral injury and failure to be able to place the stents.   She is agreeable to proceed.

## 2018-06-19 NOTE — Anesthesia Procedure Notes (Signed)
Procedure Name: Intubation Date/Time: 06/19/2018 8:36 AM Performed by: Jhonnie Garner, CRNA Pre-anesthesia Checklist: Patient identified, Emergency Drugs available, Suction available and Patient being monitored Patient Re-evaluated:Patient Re-evaluated prior to induction Oxygen Delivery Method: Circle system utilized Preoxygenation: Pre-oxygenation with 100% oxygen Induction Type: IV induction Ventilation: Mask ventilation without difficulty Laryngoscope Size: Miller and 2 Grade View: Grade I Tube type: Oral Tube size: 7.0 mm Number of attempts: 1 Airway Equipment and Method: Stylet Placement Confirmation: ETT inserted through vocal cords under direct vision,  positive ETCO2 and breath sounds checked- equal and bilateral Secured at: 22 cm Tube secured with: Tape Dental Injury: Teeth and Oropharynx as per pre-operative assessment

## 2018-06-19 NOTE — Op Note (Signed)
Shannon Reyes  Apr 18, 1951 19 Jun 2018    PCP:  Gweneth Dimitri, MD   Surgeon: Wenda Low, MD, FACS  Asst:  Jaclynn Guarneri, MD, FACS; Ovidio Kin, MD, FACS  Anes:  general  Preop Dx: Sigmoid diverticulitis recurrent Postop Dx: same  Procedure: Mobilization of the splenic flexure; sigmoid colectomy and 29 Ethicon powered stapled anastomosis Location Surgery: [facility] Complications: None   EBL:   40 cc  Drains: none  Description of Procedure:  The patient was taken to OR 1 .  After anesthesia was administered and the patient was prepped  with cholorohexidine and a timeout was performed.  Access obtained with an Optiview through the left upper quadrant.  A 5 mm was placed in the left lower quadrant and in the midline on on the right side.  Using the harmonic scalpel mobilized the sigmoid colon which was stuck to the left pelvic sidewall.  We identified the ureter which had a stent in place.  We then worked our way up the left: To free up the colon and visualized the spleen.  More medially I took the omentum off the distal transverse colon was able to get this pretty well mobilized toward the midline.  I placed a HandPort and palpated the marked disease area that was stuck to the left lateral sidewall.  We went distally and found soft area where the tenia more confluent.  I freed that area and cleaned it off and inspected with a purple load Covidien stapler.  I then went through the mesentery with the harmonic scalpel and used a vascular load on the pedicle and clipped the artery as well.  We went above the inflamed area to soft bowel and divided it.  I applied a pursestring device and using a Keith needle pass Prolene suture around the created a pursestring for the anvil.  We size this to be a 29 and used a powered Echelon stapler.  The anvil was placed into the proximal end and secured.  First Dr. Johna Sheriff went below and changed out to Dr. Ezzard Standing and passed first a 25 dilator and then try to get  the 29 dilator up.  There was an area where the Su rectosigmoid junction was stuck to itself and ended up freeing that and then we were able to pass the 29 stapler and the stem came out just below the staple line in a nice location.  These were joined and we visualized this laparoscopically as we closed the stapler and The staple size into the tighter end of the green.  The stapler was fired removed and the anastomosis was insufflated under water with the rigid sigmoidoscope.  Dr. Ezzard Standing was up to the level of the anastomosis.  There were no bubbles seen and he looked and felt the anastomosis looked complete.  There were 2 complete rings on the stapler when we inspected the fired stapler.  Sponge and needle counts were reported as correct.  The following the: Protocol changed gowns and gloves.  The peritoneum was closed with running 0 Vicryl.  Fascia was closed interrupted #1 Novafil.  Marcaine was injected into the fascial region and into the trocar sites.  Wound again was irrigated and closed with staples.  The stents were removed at the end of the case.  The patient tolerated the procedure well and was taken to the PACU in stable condition.     Matt B. Daphine Deutscher, MD, [facility], [place] [phone]

## 2018-06-19 NOTE — Op Note (Signed)
Procedure: Cystoscopy with insertion of bilateral ureteral catheters.  Preop diagnosis: Diverticulitis.  Postop diagnosis: Same.  Surgeon: Dr. Bjorn Pippin.  Anesthesia: General.  Drains: Bilateral 5 French ureteral catheters and 14 French Foley.  Specimen: None.  EBL: None.  Complications: None.  Indications: Shannon Reyes is a patient of Dr. Wenda Low who is to undergo a partial colectomy.  He requested ureteral catheter placement.  Procedure: She was taken operating room where she was given antibiotics per Dr. Ermalene Searing orders.  A general anesthetic was induced and she was placed in lithotomy position.  She was fitted with PAS hose.  Her perineum and genitalia were prepped with Betadine solution she was draped in usual sterile fashion.  Cystoscopy was performed using a 23 [place] scope and 30 degree lens.  Examination revealed a normal urethra.  The bladder wall was smooth and pale without tumor, stones or inflammation.  The ureteral orifice ease were unremarkable.  The left ureteral orifice was cannulated with a 5 [place] open ended catheter which passed easily to the kidney under fluoroscopic guidance.  The right ureteral orifice was cannulated with a 5 [place] open-ended catheter which was passed easily to the kidney under fluoroscopic guidance.  The cystoscope was removed and a 14 French Foley catheter was inserted.  The balloon was filled with 10 mL of sterile fluid.  The ureteral catheters and Foley were placed to a [place] device.  The ureteral catheters were secured to the Foley with a 2-0 silk tie.  The catheter was placed to straight drainage.  There were no complications.

## 2018-06-19 NOTE — Interval H&P Note (Signed)
History and Physical Interval Note:  06/19/2018 7:13 AM  Shannon Reyes  has presented today for surgery, with the diagnosis of diverticulitis  The various methods of treatment have been discussed with the patient and family. After consideration of risks, benefits and other options for treatment, the patient has consented to  Procedure(s): LAPAROSCOPIC ASSISTED  LEFT COLON RESECTION ERAS PATHWAY (Left) CYSTOSCOPY WITH STENT PLACEMENT (Bilateral) as a surgical intervention .  The patient's history has been reviewed, patient examined, no change in status, stable for surgery.  I have reviewed the patient's chart and labs.  Questions were answered to the patient's satisfaction.     [name]

## 2018-06-19 NOTE — Transfer of Care (Signed)
Immediate Anesthesia Transfer of Care Note  Patient: Shannon Reyes  Procedure(s) Performed: LAPAROSCOPIC ASSISTED  LEFT COLON RESECTION (Left Abdomen) CYSTOSCOPY WITH BILATERAL URETERAL STENT PLACEMENT (Bilateral Ureter)  Patient Location: PACU  Anesthesia Type:General  Level of Consciousness: awake, alert , oriented and patient cooperative  Airway & Oxygen Therapy: Patient Spontanous Breathing and Patient connected to face mask oxygen  Post-op Assessment: Report given to RN and Post -op Vital signs reviewed and stable  Post vital signs: Reviewed and stable  Last Vitals:  Vitals Value Taken Time  BP 137/83 06/19/2018 12:52 PM  Temp    Pulse 70 06/19/2018 12:56 PM  Resp 20 06/19/2018 12:56 PM  SpO2 99 % 06/19/2018 12:56 PM  Vitals shown include unvalidated device data.  Last Pain:  Vitals:   06/19/18 0624  TempSrc:   PainSc: 4       Patients Stated Pain Goal: 4 (06/19/18 0518)  Complications: No apparent anesthesia complications

## 2018-06-20 LAB — CBC
HCT: 37.2 % (ref 36.0–46.0)
HEMOGLOBIN: 12 g/dL (ref 12.0–15.0)
MCH: 31.6 pg (ref 26.0–34.0)
MCHC: 32.3 g/dL (ref 30.0–36.0)
MCV: 97.9 fL (ref 80.0–100.0)
Platelets: 225 10*3/uL (ref 150–400)
RBC: 3.8 MIL/uL — AB (ref 3.87–5.11)
RDW: 14 % (ref 11.5–15.5)
WBC: 8.4 10*3/uL (ref 4.0–10.5)
nRBC: 0 % (ref 0.0–0.2)

## 2018-06-20 LAB — COMPREHENSIVE METABOLIC PANEL
ALK PHOS: 34 U/L — AB (ref 38–126)
ALT: 28 U/L (ref 0–44)
AST: 19 U/L (ref 15–41)
Albumin: 3.4 g/dL — ABNORMAL LOW (ref 3.5–5.0)
Anion gap: 8 (ref 5–15)
BUN: 9 mg/dL (ref 8–23)
CO2: 25 mmol/L (ref 22–32)
CREATININE: 0.54 mg/dL (ref 0.44–1.00)
Calcium: 8.3 mg/dL — ABNORMAL LOW (ref 8.9–10.3)
Chloride: 106 mmol/L (ref 98–111)
GFR calc Af Amer: >60 mL/min (ref >60)
GFR calc non Af Amer: >60 mL/min (ref >60)
Glucose, Bld: 139 mg/dL — ABNORMAL HIGH (ref 70–99)
Potassium: 3.5 mmol/L (ref 3.5–5.1)
Sodium: 139 mmol/L (ref 135–145)
Total Bilirubin: 0.8 mg/dL (ref 0.3–1.2)
Total Protein: 6 g/dL — ABNORMAL LOW (ref 6.5–8.1)

## 2018-06-20 NOTE — Progress Notes (Signed)
Patient ID: Shannon Reyes, female   DOB: 15-Apr-1951, 68 y.o.   MRN: [id number]   [facility] Progress Note:   1 Day Post-Op  Subjective: Mental status is alert  Objective: Vital signs in last 24 hours: Temp:  [97.7 F (36.5 C)-99.2 F (37.3 C)] 98.5 F (36.9 C) (01/08 1406) Pulse Rate:  [64-79] 64 (01/08 1406) Resp:  [15-18] 17 (01/08 1406) BP: (109-144)/(64-105) 144/87 (01/08 1406) SpO2:  [94 %-100 %] 100 % (01/08 1406)  Intake/Output from previous day: 01/07 0701 - 01/08 0700 In: 3310.2 [P.O.:420; I.V.:2690.2; IV Piggyback:200] Out: 2376 [EGBTD:1761; Blood:100] Intake/Output this shift: Total I/O In: 1341.7 [P.O.:540; I.V.:801.7] Out: 700 [Urine:700]  Physical Exam: Work of breathing is normal.  Minimal pain  Lab Results:  Results for orders placed or performed during the hospital encounter of 06/19/18 (from the past 48 hour(s))  CBC     Status: Abnormal   Collection Time: 06/19/18  1:34 PM  Result Value Ref Range   WBC 15.2 (H) 4.0 - 10.5 K/uL   RBC 4.31 3.87 - 5.11 MIL/uL   Hemoglobin 13.9 12.0 - 15.0 g/dL   HCT 60.7 37.1 - 06.2 %   MCV 100.9 (H) 80.0 - 100.0 fL   MCH 32.3 26.0 - 34.0 pg   MCHC 32.0 30.0 - 36.0 g/dL   RDW 69.4 85.4 - 62.7 %   Platelets 213 150 - 400 K/uL   nRBC 0.0 0.0 - 0.2 %    Comment: Performed at [facility], [address], [place], [place] [address]  Creatinine, serum     Status: None   Collection Time: 06/19/18  3:23 PM  Result Value Ref Range   Creatinine, Ser 0.67 0.44 - 1.00 mg/dL   GFR calc non Af Amer >60 >60 mL/min   GFR calc Af Amer >60 >60 mL/min    Comment: Performed at [facility], [address], [place], [place] [address]  CBC     Status: Abnormal   Collection Time: 06/20/18  4:19 AM  Result Value Ref Range   WBC 8.4 4.0 - 10.5 K/uL   RBC 3.80 (L) 3.87 - 5.11 MIL/uL   Hemoglobin 12.0 12.0 - 15.0 g/dL   HCT 29.9 37.1 - 69.6 %   MCV 97.9 80.0 - 100.0 fL   MCH 31.6 26.0 -  34.0 pg   MCHC 32.3 30.0 - 36.0 g/dL   RDW 78.9 38.1 - 01.7 %   Platelets 225 150 - 400 K/uL   nRBC 0.0 0.0 - 0.2 %    Comment: Performed at [facility], [address] [place], [place] [address]  Comprehensive metabolic panel     Status: Abnormal   Collection Time: 06/20/18  4:19 AM  Result Value Ref Range   Sodium 139 135 - 145 mmol/L   Potassium 3.5 3.5 - 5.1 mmol/L   Chloride 106 98 - 111 mmol/L   CO2 25 22 - 32 mmol/L   Glucose, Bld 139 (H) 70 - 99 mg/dL   BUN 9 8 - 23 mg/dL   Creatinine, Ser 8.52 0.44 - 1.00 mg/dL   Calcium 8.3 (L) 8.9 - 10.3 mg/dL   Total Protein 6.0 (L) 6.5 - 8.1 g/dL   Albumin 3.4 (L) 3.5 - 5.0 g/dL   AST 19 15 - 41 U/L   ALT 28 0 - 44 U/L   Alkaline Phosphatase 34 (L) 38 - 126 U/L   Total Bilirubin 0.8 0.3 - 1.2 mg/dL  GFR calc non Af Amer >60 >60 mL/min   GFR calc Af Amer >60 >60 mL/min   Anion gap 8 5 - 15    Comment: Performed at [facility], [address], [place], [place] [address]    Radiology/Results: Dg C-arm 1-60 Min-no Report  Result Date: 06/19/2018 Fluoroscopy was utilized by the requesting physician.  No radiographic interpretation.    Anti-infectives: Anti-infectives (From admission, onward)   Start     Dose/Rate Route Frequency Ordered Stop   06/19/18 1515  cefoTEtan (CEFOTAN) 2 g in sodium chloride 0.9 % 100 mL IVPB     2 g 200 mL/hr over 30 Minutes Intravenous Every 12 hours 06/19/18 1506 06/19/18 1609   06/19/18 0615  cefoTEtan (CEFOTAN) 2 g in sodium chloride 0.9 % 100 mL IVPB     2 g 200 mL/hr over 30 Minutes Intravenous On call to O.R. 06/19/18 0614 06/19/18 0910      Assessment/Plan: Problem List: Patient Active Problem List   Diagnosis Date Noted  . S/P partial colectomy 06/19/2018  . GERD (gastroesophageal reflux disease) 01/25/2013  . HTN (hypertension) 12/31/2012  . Murmur 12/31/2012  . 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 Elevated LDL cholesterol level 12/31/2012   . Preop cardiovascular exam 12/31/2012  . Morbid obesity-BMI 41 12/06/2012    Had flatus this am.  Will advance diet 1 Day Post-Op    LOS: 1 day   Matt B. Daphine Deutscher, MD, [facility] [phone] beeper [phone]  06/20/2018 3:51 PM

## 2018-06-20 NOTE — Progress Notes (Signed)
Patient blood pressure was 132/105. Patient refused to take her Lopressor 5mg  injection. Patient took her 25mg  of metoprolol. Will continue to monitor blood pressure

## 2018-06-20 NOTE — Anesthesia Postprocedure Evaluation (Signed)
Anesthesia Post Note  Patient: SHELINA JUNGLING  Procedure(s) Performed: LAPAROSCOPIC ASSISTED  LEFT COLON RESECTION (Left Abdomen) CYSTOSCOPY WITH BILATERAL URETERAL STENT PLACEMENT (Bilateral Ureter)     Patient location during evaluation: PACU Anesthesia Type: General Level of consciousness: awake and alert Pain management: pain level controlled Vital Signs Assessment: post-procedure vital signs reviewed and stable Respiratory status: spontaneous breathing, nonlabored ventilation, respiratory function stable and patient connected to nasal cannula oxygen Cardiovascular status: blood pressure returned to baseline and stable Postop Assessment: no apparent nausea or vomiting Anesthetic complications: no    Last Vitals:  Vitals:   06/20/18 1212 06/20/18 1406  BP: (!) 132/105 (!) 144/87  Pulse: 70 64  Resp:  17  Temp:  36.9 C  SpO2:  100%    Last Pain:  Vitals:   06/20/18 1406  TempSrc: Oral  PainSc:                  Sung Parodi L Edmund Holcomb

## 2018-06-21 ENCOUNTER — Encounter (HOSPITAL_COMMUNITY): Payer: Self-pay | Admitting: Surgery

## 2018-06-21 LAB — CBC
HCT: 35.6 % — ABNORMAL LOW (ref 36.0–46.0)
Hemoglobin: 11.2 g/dL — ABNORMAL LOW (ref 12.0–15.0)
MCH: 32.3 pg (ref 26.0–34.0)
MCHC: 31.5 g/dL (ref 30.0–36.0)
MCV: 102.6 fL — ABNORMAL HIGH (ref 80.0–100.0)
Platelets: 173 10*3/uL (ref 150–400)
RBC: 3.47 MIL/uL — ABNORMAL LOW (ref 3.87–5.11)
RDW: 14.2 % (ref 11.5–15.5)
WBC: 7.6 10*3/uL (ref 4.0–10.5)
nRBC: 0 % (ref 0.0–0.2)

## 2018-06-21 MED ORDER — PNEUMOCOCCAL VAC POLYVALENT 25 MCG/0.5ML IJ INJ
0.5000 mL | INJECTION | INTRAMUSCULAR | Status: AC
  Administered 2018-06-22: 0.5 mL via INTRAMUSCULAR
  Filled 2018-06-21: qty 0.5

## 2018-06-21 MED ORDER — ACETAMINOPHEN 325 MG PO TABS: 650.0000 mg | ORAL_TABLET | ORAL | Status: DC | PRN

## 2018-06-21 NOTE — Progress Notes (Signed)
Patient ID: Shannon Reyes, female   DOB: Oct 26, 1950, 68 y.o.   MRN: [id number] [facility] Progress Note:   2 Days Post-Op  Subjective: Mental status is clear;  Having some muscle pain as Exparel wears off Objective: Vital signs in last 24 hours: Temp:  [98.2 F (36.8 C)-98.5 F (36.9 C)] 98.5 F (36.9 C) (01/09 0647) Pulse Rate:  [57-72] 57 (01/09 0647) Resp:  [16-18] 16 (01/09 0647) BP: (131-144)/(71-105) 139/86 (01/09 0647) SpO2:  [95 %-100 %] 97 % (01/09 0647)  Intake/Output from previous day: 01/08 0701 - 01/09 0700 In: 2673.3 [P.O.:660; I.V.:2013.3] Out: 2350 [Urine:2350] Intake/Output this shift: Total I/O In: 240 [P.O.:240] Out: -   Physical Exam: Work of breathing is normal.  + flatus  Lab Results:  Results for orders placed or performed during the hospital encounter of 06/19/18 (from the past 48 hour(s))  CBC     Status: Abnormal   Collection Time: 06/19/18  1:34 PM  Result Value Ref Range   WBC 15.2 (H) 4.0 - 10.5 K/uL   RBC 4.31 3.87 - 5.11 MIL/uL   Hemoglobin 13.9 12.0 - 15.0 g/dL   HCT 78.2 42.3 - 53.6 %   MCV 100.9 (H) 80.0 - 100.0 fL   MCH 32.3 26.0 - 34.0 pg   MCHC 32.0 30.0 - 36.0 g/dL   RDW 14.4 31.5 - 40.0 %   Platelets 213 150 - 400 K/uL   nRBC 0.0 0.0 - 0.2 %    Comment: Performed at [facility], [address], [place], [place] [address]  Creatinine, serum     Status: None   Collection Time: 06/19/18  3:23 PM  Result Value Ref Range   Creatinine, Ser 0.67 0.44 - 1.00 mg/dL   GFR calc non Af Amer >60 >60 mL/min   GFR calc Af Amer >60 >60 mL/min    Comment: Performed at [facility], [address], [place], [place] [address]  CBC     Status: Abnormal   Collection Time: 06/20/18  4:19 AM  Result Value Ref Range   WBC 8.4 4.0 - 10.5 K/uL   RBC 3.80 (L) 3.87 - 5.11 MIL/uL   Hemoglobin 12.0 12.0 - 15.0 g/dL   HCT 26.7 12.4 - 58.0 %   MCV 97.9 80.0 - 100.0 fL   MCH 31.6 26.0 - 34.0 pg   MCHC  32.3 30.0 - 36.0 g/dL   RDW 99.8 33.8 - 25.0 %   Platelets 225 150 - 400 K/uL   nRBC 0.0 0.0 - 0.2 %    Comment: Performed at [facility], [address] [place], [place] [address]  Comprehensive metabolic panel     Status: Abnormal   Collection Time: 06/20/18  4:19 AM  Result Value Ref Range   Sodium 139 135 - 145 mmol/L   Potassium 3.5 3.5 - 5.1 mmol/L   Chloride 106 98 - 111 mmol/L   CO2 25 22 - 32 mmol/L   Glucose, Bld 139 (H) 70 - 99 mg/dL   BUN 9 8 - 23 mg/dL   Creatinine, Ser 7.34 0.44 - 1.00 mg/dL   Calcium 8.3 (L) 8.9 - 10.3 mg/dL   Total Protein 6.0 (L) 6.5 - 8.1 g/dL   Albumin 3.4 (L) 3.5 - 5.0 g/dL   AST 19 15 - 41 U/L   ALT 28 0 - 44 U/L   Alkaline Phosphatase 34 (L) 38 - 126 U/L   Total Bilirubin 0.8 0.3 -  1.2 mg/dL   GFR calc non Af Amer >60 >60 mL/min   GFR calc Af Amer >60 >60 mL/min   Anion gap 8 5 - 15    Comment: Performed at [facility], [address] [place], [place] [address]  CBC     Status: Abnormal   Collection Time: 06/21/18  4:44 AM  Result Value Ref Range   WBC 7.6 4.0 - 10.5 K/uL   RBC 3.47 (L) 3.87 - 5.11 MIL/uL   Hemoglobin 11.2 (L) 12.0 - 15.0 g/dL   HCT 19.135.6 (L) 47.836.0 - 29.546.0 %   MCV 102.6 (H) 80.0 - 100.0 fL   MCH 32.3 26.0 - 34.0 pg   MCHC 31.5 30.0 - 36.0 g/dL   RDW 62.114.2 30.811.5 - 65.715.5 %   Platelets 173 150 - 400 K/uL   nRBC 0.0 0.0 - 0.2 %    Comment: Performed at [facility], [address] [place], [place] [address]    Radiology/Results: Dg C-arm 1-60 Min-no Report  Result Date: 06/19/2018 Fluoroscopy was utilized by the requesting physician.  No radiographic interpretation.    Anti-infectives: Anti-infectives (From admission, onward)   Start     Dose/Rate Route Frequency Ordered Stop   06/19/18 1515  cefoTEtan (CEFOTAN) 2 g in sodium chloride 0.9 % 100 mL IVPB     2 g 200 mL/hr over 30 Minutes Intravenous Every 12 hours 06/19/18 1506 06/19/18 1609   06/19/18 0615   cefoTEtan (CEFOTAN) 2 g in sodium chloride 0.9 % 100 mL IVPB     2 g 200 mL/hr over 30 Minutes Intravenous On call to O.R. 06/19/18 0614 06/19/18 0910      Assessment/Plan: Problem List: Patient Active Problem List   Diagnosis Date Noted  . S/P partial colectomy 06/19/2018  . GERD (gastroesophageal reflux disease) 01/25/2013  . HTN (hypertension) 12/31/2012  . Murmur 12/31/2012  . 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 Elevated LDL cholesterol level 12/31/2012  . Preop cardiovascular exam 12/31/2012  . Morbid obesity-BMI 41 12/06/2012    Continue full liquids;  Decrease IV fluids 2 Days Post-Op    LOS: 2 days   Matt B. Daphine DeutscherMartin, MD, [facility]  [facility] [phone] beeper [phone]  06/21/2018 8:12 AM

## 2018-06-21 NOTE — OR Nursing (Signed)
Corrected Dr. Belva Crome procedure from ureteral stent to ureteral catheter. Maridee Slape, RN3, BSN

## 2018-06-22 LAB — CBC
HCT: 36.7 % (ref 36.0–46.0)
Hemoglobin: 11.4 g/dL — ABNORMAL LOW (ref 12.0–15.0)
MCH: 31.3 pg (ref 26.0–34.0)
MCHC: 31.1 g/dL (ref 30.0–36.0)
MCV: 100.8 fL — ABNORMAL HIGH (ref 80.0–100.0)
Platelets: 200 10*3/uL (ref 150–400)
RBC: 3.64 MIL/uL — ABNORMAL LOW (ref 3.87–5.11)
RDW: 13.9 % (ref 11.5–15.5)
WBC: 6.7 10*3/uL (ref 4.0–10.5)
nRBC: 0 % (ref 0.0–0.2)

## 2018-06-22 MED ORDER — ACETAMINOPHEN 325 MG PO TABS: 650.0000 mg | ORAL_TABLET | ORAL | Status: DC | PRN

## 2018-06-22 NOTE — Progress Notes (Signed)
Patient ID: Shannon Reyes, female   DOB: 1951-03-25, 68 y.o.   MRN: [id number] [facility] Progress Note:   3 Days Post-Op  Subjective: Mental status is clear.  Up and about.  Taking full liquid diet Objective: Vital signs in last 24 hours: Temp:  [97.9 F (36.6 C)-98.2 F (36.8 C)] 98.2 F (36.8 C) (01/10 0527) Pulse Rate:  [52-58] 56 (01/10 0527) Resp:  [18-19] 18 (01/10 0527) BP: (115-138)/(74-82) 138/74 (01/10 0527) SpO2:  [96 %] 96 % (01/10 0527)  Intake/Output from previous day: 01/09 0701 - 01/10 0700 In: 3200 [P.O.:800; I.V.:2400] Out: 2100 [Urine:2100] Intake/Output this shift: Total I/O In: 360 [P.O.:360] Out: 300 [Urine:300]  Physical Exam: Work of breathing is normal.  Incisions covered.  Lab Results:  Results for orders placed or performed during the hospital encounter of 06/19/18 (from the past 48 hour(s))  CBC     Status: Abnormal   Collection Time: 06/21/18  4:44 AM  Result Value Ref Range   WBC 7.6 4.0 - 10.5 K/uL   RBC 3.47 (L) 3.87 - 5.11 MIL/uL   Hemoglobin 11.2 (L) 12.0 - 15.0 g/dL   HCT 73.7 (L) 10.6 - 26.9 %   MCV 102.6 (H) 80.0 - 100.0 fL   MCH 32.3 26.0 - 34.0 pg   MCHC 31.5 30.0 - 36.0 g/dL   RDW 48.5 46.2 - 70.3 %   Platelets 173 150 - 400 K/uL   nRBC 0.0 0.0 - 0.2 %    Comment: Performed at [facility], [address], [place], [place] [address]  CBC     Status: Abnormal   Collection Time: 06/22/18  4:34 AM  Result Value Ref Range   WBC 6.7 4.0 - 10.5 K/uL   RBC 3.64 (L) 3.87 - 5.11 MIL/uL   Hemoglobin 11.4 (L) 12.0 - 15.0 g/dL   HCT 81.8 29.9 - 37.1 %   MCV 100.8 (H) 80.0 - 100.0 fL   MCH 31.3 26.0 - 34.0 pg   MCHC 31.1 30.0 - 36.0 g/dL   RDW 69.6 78.9 - 38.1 %   Platelets 200 150 - 400 K/uL   nRBC 0.0 0.0 - 0.2 %    Comment: Performed at [facility], [address] [place], [place] [address]    Radiology/Results: No results found.  Anti-infectives: Anti-infectives (From  admission, onward)   Start     Dose/Rate Route Frequency Ordered Stop   06/19/18 1515  cefoTEtan (CEFOTAN) 2 g in sodium chloride 0.9 % 100 mL IVPB     2 g 200 mL/hr over 30 Minutes Intravenous Every 12 hours 06/19/18 1506 06/19/18 1609   06/19/18 0615  cefoTEtan (CEFOTAN) 2 g in sodium chloride 0.9 % 100 mL IVPB     2 g 200 mL/hr over 30 Minutes Intravenous On call to O.R. 06/19/18 0614 06/19/18 0910      Assessment/Plan: Problem List: Patient Active Problem List   Diagnosis Date Noted  . S/P partial colectomy 06/19/2018  . GERD (gastroesophageal reflux disease) 01/25/2013  . HTN (hypertension) 12/31/2012  . Murmur 12/31/2012  . 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 Elevated LDL cholesterol level 12/31/2012  . Preop cardiovascular exam 12/31/2012  . Morbid obesity-BMI 41 12/06/2012    Will advance to regular diet.  Hopeful discharge tomorrow.   3 Days Post-Op    LOS: 3 days   Matt B. Daphine Deutscher, MD, [facility] [phone] beeper [phone]  06/22/2018 1:22 PM

## 2018-06-23 DIAGNOSIS — K5732 Diverticulitis of large intestine without perforation or abscess without bleeding: Secondary | ICD-10-CM

## 2018-06-23 MED ORDER — HYDROCODONE-ACETAMINOPHEN 10-325 MG PO TABS: 1.0000 | ORAL_TABLET | Freq: Four times a day (QID) | ORAL | 0 refills | Status: DC | PRN

## 2018-06-23 MED ORDER — LISINOPRIL 20 MG PO TABS
40.0000 mg | ORAL_TABLET | Freq: Two times a day (BID) | ORAL | Status: DC
  Administered 2018-06-23: 40 mg via ORAL
  Filled 2018-06-23: qty 2

## 2018-06-23 MED ORDER — HYDROCHLOROTHIAZIDE 25 MG PO TABS
25.0000 mg | ORAL_TABLET | Freq: Every day | ORAL | Status: DC
  Administered 2018-06-23: 25 mg via ORAL
  Filled 2018-06-23: qty 1

## 2018-06-23 MED ORDER — BUPROPION HCL ER (XL) 300 MG PO TB24
300.0000 mg | ORAL_TABLET | Freq: Every day | ORAL | Status: DC
  Administered 2018-06-23: 300 mg via ORAL
  Filled 2018-06-23: qty 1

## 2018-06-23 NOTE — Progress Notes (Signed)
Nurse reviewed discharge instructions with pt. Pt verbalized understanding of discharge instructions, follow up appointments and new medications.  Prescription electronically sent to pt's pharmacy. No concerns at time of discharge.

## 2018-06-23 NOTE — Discharge Instructions (Signed)
SURGERY: POST OP INSTRUCTIONS °(Surgery for small bowel obstruction, colon resection, etc) ° ° °###################################################################### ° °EAT °Gradually transition to a high fiber diet with a fiber supplement over the next few days after discharge ° °WALK °Walk an hour a day.  Control your pain to do that.   ° °CONTROL PAIN °Control pain so that you can walk, sleep, tolerate sneezing/coughing, go up/down stairs. ° °HAVE A BOWEL MOVEMENT DAILY °Keep your bowels regular to avoid problems.  OK to try a laxative to override constipation.  OK to use an antidairrheal to slow down diarrhea.  Call if not better after 2 tries ° °CALL IF YOU HAVE PROBLEMS/CONCERNS °Call if you are still struggling despite following these instructions. °Call if you have concerns not answered by these instructions ° °###################################################################### ° ° °DIET °Follow a light diet the first few days at home.  Start with a bland diet such as soups, liquids, starchy foods, low fat foods, etc.  If you feel full, bloated, or constipated, stay on a ful liquid or pureed/blenderized diet for a few days until you feel better and no longer constipated. °Be sure to drink plenty of fluids every day to avoid getting dehydrated (feeling dizzy, not urinating, etc.). °Gradually add a fiber supplement to your diet over the next week.  Gradually get back to a regular solid diet.  Avoid fast food or heavy meals the first week as you are more likely to get nauseated. °It is expected for your digestive tract to need a few months to get back to normal.  It is common for your bowel movements and stools to be irregular.  You will have occasional bloating and cramping that should eventually fade away.  Until you are eating solid food normally, off all pain medications, and back to regular activities; your bowels will not be normal. °Focus on eating a low-fat, high fiber diet the rest of your life  (See Getting to Good Bowel Health, below). ° °CARE of your INCISION or WOUND °It is good for closed incision and even open wounds to be washed every day.  Shower every day.  Short baths are fine.  Wash the incisions and wounds clean with soap & water.    °If you have a closed incision(s), wash the incision with soap & water every day.  You may leave closed incisions open to air if it is dry.   You may cover the incision with clean gauze & replace it after your daily shower for comfort. °If you have skin tapes (Steristrips) or skin glue (Dermabond) on your incision, leave them in place.  They will fall off on their own like a scab.  You may trim any edges that curl up with clean scissors.  If you have staples, set up an appointment for them to be removed in the office in 10 days after surgery.  °If you have a drain, wash around the skin exit site with soap & water and place a new dressing of gauze or band aid around the skin every day.  Keep the drain site clean & dry.    °If you have an open wound with packing, see wound care instructions.  In general, it is encouraged that you remove your dressing and packing, shower with soap & water, and replace your dressing once a day.  Pack the wound with clean gauze moistened with normal (0.9%) saline to keep the wound moist & uninfected.  Pressure on the dressing for 30 minutes will stop most wound   bleeding.  Eventually your body will heal & pull the open wound closed over the next few months.  °Raw open wounds will occasionally bleed or secrete yellow drainage until it heals closed.  Drain sites will drain a little until the drain is removed.  Even closed incisions can have mild bleeding or drainage the first few days until the skin edges scab over & seal.   °If you have an open wound with a wound vac, see wound vac care instructions. ° ° ° ° °ACTIVITIES as tolerated °Start light daily activities --- self-care, walking, climbing stairs-- beginning the day after surgery.   Gradually increase activities as tolerated.  Control your pain to be active.  Stop when you are tired.  Ideally, walk several times a day, eventually an hour a day.   °Most people are back to most day-to-day activities in a few weeks.  It takes 4-8 weeks to get back to unrestricted, intense activity. °If you can walk 30 minutes without difficulty, it is safe to try more intense activity such as jogging, treadmill, bicycling, low-impact aerobics, swimming, etc. °Save the most intensive and strenuous activity for last (Usually 4-8 weeks after surgery) such as sit-ups, heavy lifting, contact sports, etc.  Refrain from any intense heavy lifting or straining until you are off narcotics for pain control.  You will have off days, but things should improve week-by-week. °DO NOT PUSH THROUGH PAIN.  Let pain be your guide: If it hurts to do something, don't do it.  Pain is your body warning you to avoid that activity for another week until the pain goes down. °You may drive when you are no longer taking narcotic prescription pain medication, you can comfortably wear a seatbelt, and you can safely make sudden turns/stops to protect yourself without hesitating due to pain. °You may have sexual intercourse when it is comfortable. If it hurts to do something, stop. ° °MEDICATIONS °Take your usually prescribed home medications unless otherwise directed.   °Blood thinners:  °Usually you can restart any strong blood thinners after the second postoperative day.  It is OK to take aspirin right away.    ° If you are on strong blood thinners (warfarin/Coumadin, Plavix, Xerelto, Eliquis, Pradaxa, etc), discuss with your surgeon, medicine PCP, and/or cardiologist for instructions on when to restart the blood thinner & if blood monitoring is needed (PT/INR blood check, etc).   ° ° °PAIN CONTROL °Pain after surgery or related to activity is often due to strain/injury to muscle, tendon, nerves and/or incisions.  This pain is usually  short-term and will improve in a few months.  °To help speed the process of healing and to get back to regular activity more quickly, DO THE FOLLOWING THINGS TOGETHER: °1. Increase activity gradually.  DO NOT PUSH THROUGH PAIN °2. Use Ice and/or Heat °3. Try Gentle Massage and/or Stretching °4. Take over the counter pain medication °5. Take Narcotic prescription pain medication for more severe pain ° °Good pain control = faster recovery.  It is better to take more medicine to be more active than to stay in bed all day to avoid medications. °1.  Increase activity gradually °Avoid heavy lifting at first, then increase to lifting as tolerated over the next 6 weeks. °Do not “push through” the pain.  Listen to your body and avoid positions and maneuvers than reproduce the pain.  Wait a few days before trying something more intense °Walking an hour a day is encouraged to help your body recover faster   and more safely.  Start slowly and stop when getting sore.  If you can walk 30 minutes without stopping or pain, you can try more intense activity (running, jogging, aerobics, cycling, swimming, treadmill, sex, sports, weightlifting, etc.) °Remember: If it hurts to do it, then don’t do it! °2. Use Ice and/or Heat °You will have swelling and bruising around the incisions.  This will take several weeks to resolve. °Ice packs or heating pads (6-8 times a day, 30-60 minutes at a time) will help sooth soreness & bruising. °Some people prefer to use ice alone, heat alone, or alternate between ice & heat.  Experiment and see what works best for you.  Consider trying ice for the first few days to help decrease swelling and bruising; then, switch to heat to help relax sore spots and speed recovery. °Shower every day.  Short baths are fine.  It feels good!  Keep the incisions and wounds clean with soap & water.   °3. Try Gentle Massage and/or Stretching °Massage at the area of pain many times a day °Stop if you feel pain - do not  overdo it °4. Take over the counter pain medication °This helps the muscle and nerve tissues become less irritable and calm down faster °Choose ONE of the following over-the-counter anti-inflammatory medications: °Acetaminophen 500mg tabs (Tylenol) 1-2 pills with every meal and just before bedtime (avoid if you have liver problems or if you have acetaminophen in you narcotic prescription) °Naproxen 220mg tabs (ex. Aleve, Naprosyn) 1-2 pills twice a day (avoid if you have kidney, stomach, IBD, or bleeding problems) °Ibuprofen 200mg tabs (ex. Advil, Motrin) 3-4 pills with every meal and just before bedtime (avoid if you have kidney, stomach, IBD, or bleeding problems) °Take with food/snack several times a day as directed for at least 2 weeks to help keep pain / soreness down & more manageable. °5. Take Narcotic prescription pain medication for more severe pain °A prescription for strong pain control is often given to you upon discharge (for example: oxycodone/Percocet, hydrocodone/Norco/Vicodin, or tramadol/Ultram) °Take your pain medication as prescribed. °Be mindful that most narcotic prescriptions contain Tylenol (acetaminophen) as well - avoid taking too much Tylenol. °If you are having problems/concerns with the prescription medicine (does not control pain, nausea, vomiting, rash, itching, etc.), please call us [phone] to see if we need to switch you to a different pain medicine that will work better for you and/or control your side effects better. °If you need a refill on your pain medication, you must call the office before 4 pm and on weekdays only.  By federal law, prescriptions for narcotics cannot be called into a pharmacy.  They must be filled out on paper & picked up from our office by the patient or authorized caretaker.  Prescriptions cannot be filled after 4 pm nor on weekends.   ° °WHEN TO CALL US [phone] °Severe uncontrolled or worsening pain  °Fever over 101 F (38.5 C) °Concerns with  the incision: Worsening pain, redness, rash/hives, swelling, bleeding, or drainage °Reactions / problems with new medications (itching, rash, hives, nausea, etc.) °Nausea and/or vomiting °Difficulty urinating °Difficulty breathing °Worsening fatigue, dizziness, lightheadedness, blurred vision °Other concerns °If you are not getting better after two weeks or are noticing you are getting worse, contact our office [phone] for further advice.  We may need to adjust your medications, re-evaluate you in the office, send you to the emergency room, or see what other things we can do to help. °The   clinic staff is available to answer your questions during regular business hours (8:30am-5pm).  Please don’t hesitate to call and ask to speak to one of our nurses for clinical concerns.    °A surgeon from [facility] is always on call at the hospitals 24 hours/day °If you have a medical emergency, go to the nearest emergency room or call 911. ° °FOLLOW UP in our office °One the day of your discharge from the hospital (or the next business weekday), please call [facility] to set up or confirm an appointment to see your surgeon in the office for a follow-up appointment.  Usually it is 2-3 weeks after your surgery.   °If you have skin staples at your incision(s), let the office know so we can set up a time in the office for the nurse to remove them (usually around 10 days after surgery). °Make sure that you call for appointments the day of discharge (or the next business weekday) from the hospital to ensure a convenient appointment time. °IF YOU HAVE DISABILITY OR FAMILY LEAVE FORMS, BRING THEM TO THE OFFICE FOR PROCESSING.  DO NOT GIVE THEM TO YOUR DOCTOR. ° °[facility] °[address], [address], [place], [place]  [address] ? °[phone] - Main °[phone] - Toll Free,  [phone] - Fax °[email or web address] ° °GETTING TO GOOD BOWEL HEALTH. °It is  expected for your digestive tract to need a few months to get back to normal.  It is common for your bowel movements and stools to be irregular.  You will have occasional bloating and cramping that should eventually fade away.  Until you are eating solid food normally, off all pain medications, and back to regular activities; your bowels will not be normal.   °Avoiding constipation °The goal: ONE SOFT BOWEL MOVEMENT A DAY!    °Drink plenty of fluids.  Choose water first. °TAKE A FIBER SUPPLEMENT EVERY DAY THE REST OF YOUR LIFE °During your first week back home, gradually add back a fiber supplement every day °Experiment which form you can tolerate.   There are many forms such as powders, tablets, wafers, gummies, etc °Psyllium bran (Metamucil), methylcellulose (Citrucel), Miralax or Glycolax, Benefiber, Flax Seed.  °Adjust the dose week-by-week (1/2 dose/day to 6 doses a day) until you are moving your bowels 1-2 times a day.  Cut back the dose or try a different fiber product if it is giving you problems such as diarrhea or bloating. °Sometimes a laxative is needed to help jump-start bowels if constipated until the fiber supplement can help regulate your bowels.  If you are tolerating eating & you are farting, it is okay to try a gentle laxative such as double dose MiraLax, prune juice, or Milk of Magnesia.  Avoid using laxatives too often. °Stool softeners can sometimes help counteract the constipating effects of narcotic pain medicines.  It can also cause diarrhea, so avoid using for too long. °If you are still constipated despite taking fiber daily, eating solids, and a few doses of laxatives, call our office. °Controlling diarrhea °Try drinking liquids and eating bland foods for a few days to avoid stressing your intestines further. °Avoid dairy products (especially milk & ice cream) for a short time.  The intestines often can lose the ability to digest lactose when stressed. °Avoid foods that cause gassiness or  bloating.  Typical foods include beans and other legumes, cabbage, broccoli, and dairy foods.  Avoid greasy, spicy, fast foods.  Every person has   some sensitivity to other foods, so listen to your body and avoid those foods that trigger problems for you. °Probiotics (such as active yogurt, Align, etc) may help repopulate the intestines and colon with normal bacteria and calm down a sensitive digestive tract °Adding a fiber supplement gradually can help thicken stools by absorbing excess fluid and retrain the intestines to act more normally.  Slowly increase the dose over a few weeks.  Too much fiber too soon can backfire and cause cramping & bloating. °It is okay to try and slow down diarrhea with a few doses of antidiarrheal medicines.   °Bismuth subsalicylate (ex. Kayopectate, Pepto Bismol) for a few doses can help control diarrhea.  Avoid if pregnant.   °Loperamide (Imodium) can slow down diarrhea.  Start with one tablet (2mg) first.  Avoid if you are having fevers or severe pain.  °ILEOSTOMY PATIENTS WILL HAVE CHRONIC DIARRHEA since their colon is not in use.    °Drink plenty of liquids.  You will need to drink even more glasses of water/liquid a day to avoid getting dehydrated. °Record output from your ileostomy.  Expect to empty the bag every 3-4 hours at first.  Most people with a permanent ileostomy empty their bag 4-6 times at the least.   °Use antidiarrheal medicine (especially Imodium) several times a day to avoid getting dehydrated.  Start with a dose at bedtime & breakfast.  Adjust up or down as needed.  Increase antidiarrheal medications as directed to avoid emptying the bag more than 8 times a day (every 3 hours). °Work with your wound ostomy nurse to learn care for your ostomy.  See ostomy care instructions. °TROUBLESHOOTING IRREGULAR BOWELS °1) Start with a soft & bland diet. No spicy, greasy, or fried foods.  °2) Avoid gluten/wheat or dairy products from diet to see if symptoms improve. °3) Miralax  17gm or flax seed mixed in 8oz. water or juice-daily. May use 2-4 times a day as needed. °4) Gas-X, Phazyme, etc. as needed for gas & bloating.  °5) Prilosec (omeprazole) over-the-counter as needed °6)  Consider probiotics (Align, Activa, etc) to help calm the bowels down ° °Call your doctor if you are getting worse or not getting better.  Sometimes further testing (cultures, endoscopy, X-ray studies, CT scans, bloodwork, etc.) may be needed to help diagnose and treat the cause of the diarrhea. °[facility] °[address], [address], [place], [place]  [address] °[phone] - Main.    °[phone]  - Toll Free.   [phone] - Fax °[email or web address] ° ° ° °Diverticulitis ° °Diverticulitis is infection or inflammation of small pouches (diverticula) in the colon that form due to a condition called diverticulosis. Diverticula can trap stool (feces) and bacteria, causing infection and inflammation. °Diverticulitis may cause severe stomach pain and diarrhea. It may lead to tissue damage in the colon that causes bleeding. The diverticula may also burst (rupture) and cause infected stool to enter other areas of the abdomen. °Complications of diverticulitis can include: °· Bleeding. °· Severe infection. °· Severe pain. °· Rupture (perforation) of the colon. °· Blockage (obstruction) of the colon. °What are the causes? °This condition is caused by stool becoming trapped in the diverticula, which allows bacteria to grow in the diverticula. This leads to inflammation and infection. °What increases the risk? °You are more likely to develop this condition if: °· You have diverticulosis. The risk for diverticulosis increases if: °? You are overweight or obese. °? You use tobacco products. °? You   do not get enough exercise. °· You eat a diet that does not include enough fiber. High-fiber foods include fruits, vegetables, beans, nuts, and whole grains. °What are the signs or  symptoms? °Symptoms of this condition may include: °· Pain and tenderness in the abdomen. The pain is normally located on the left side of the abdomen, but it may occur in other areas. °· Fever and chills. °· Bloating. °· Cramping. °· Nausea. °· Vomiting. °· Changes in bowel routines. °· Blood in your stool. °How is this diagnosed? °This condition is diagnosed based on: °· Your medical history. °· A physical exam. °· Tests to make sure there is nothing else causing your condition. These tests may include: °? Blood tests. °? Urine tests. °? Imaging tests of the abdomen, including X-rays, ultrasounds, MRIs, or CT scans. °How is this treated? °Most cases of this condition are mild and can be treated at home. Treatment may include: °· Taking over-the-counter pain medicines. °· Following a clear liquid diet. °· Taking antibiotic medicines by mouth. °· Rest. °More severe cases may need to be treated at a hospital. Treatment may include: °· Not eating or drinking. °· Taking prescription pain medicine. °· Receiving antibiotic medicines through an IV tube. °· Receiving fluids and nutrition through an IV tube. °· Surgery. °When your condition is under control, your health care provider may recommend that you have a colonoscopy. This is an exam to look at the entire large intestine. During the exam, a lubricated, bendable tube is inserted into the anus and then passed into the rectum, colon, and other parts of the large intestine. A colonoscopy can show how severe your diverticula are and whether something else may be causing your symptoms. °Follow these instructions at home: °Medicines °· Take over-the-counter and prescription medicines only as told by your health care provider. These include fiber supplements, probiotics, and stool softeners. °· If you were prescribed an antibiotic medicine, take it as told by your health care provider. Do not stop taking the antibiotic even if you start to feel better. °· Do not drive or  use heavy machinery while taking prescription pain medicine. °General instructions ° °· Follow a full liquid diet or another diet as directed by your health care provider. After your symptoms improve, your health care provider may tell you to change your diet. He or she may recommend that you eat a diet that contains at least 25 g (25 grams) of fiber daily. Fiber makes it easier to pass stool. Healthy sources of fiber include: °? Berries. One cup contains 4-8 grams of fiber. °? Beans or lentils. One half cup contains 5-8 grams of fiber. °? Green vegetables. One cup contains 4 grams of fiber. °· Exercise for at least 30 minutes, 3 times each week. You should exercise hard enough to raise your heart rate and break a sweat. °· Keep all follow-up visits as told by your health care provider. This is important. You may need a colonoscopy. °Contact a health care provider if: °· Your pain does not improve. °· You have a hard time drinking or eating food. °· Your bowel movements do not return to normal. °Get help right away if: °· Your pain gets worse. °· Your symptoms do not get better with treatment. °· Your symptoms suddenly get worse. °· You have a fever. °· You vomit more than one time. °· You have stools that are bloody, black, or tarry. °Summary °· Diverticulitis is infection or inflammation of small pouches (diverticula) in the   colon that form due to a condition called diverticulosis. Diverticula can trap stool (feces) and bacteria, causing infection and inflammation. °· You are at higher risk for this condition if you have diverticulosis and you eat a diet that does not include enough fiber. °· Most cases of this condition are mild and can be treated at home. More severe cases may need to be treated at a hospital. °· When your condition is under control, your health care provider may recommend that you have an exam called a colonoscopy. This exam can show how severe your diverticula are and whether something else  may be causing your symptoms. °This information is not intended to replace advice given to you by your health care provider. Make sure you discuss any questions you have with your health care provider. °Document Released: 03/09/2005 Document Revised: 07/02/2016 Document Reviewed: 07/02/2016 °[employer] Interactive Patient Education © 2019 [employer] ° °

## 2018-06-23 NOTE — Discharge Summary (Signed)
Physician Discharge Summary    Patient ID: Shannon OysterDonna D Simao MRN: [id number] DOB/AGE: 67/02/1951  68 y.o.  Patient Care Team: Gweneth DimitriMcNeill, Wendy, MD as PCP - General (Family Medicine) Luretha MurphyMartin, Matthew, MD as Consulting Physician (General Surgery)  Admit date: 06/19/2018  Discharge date: 06/23/2018  Hospital Stay = 4 days    Discharge Diagnoses:  Principal Problem:   Diverticulitis of sigmoid colon s/p lap colectomy 06/19/2018 Active Problems:   S/P partial colectomy   4 Days Post-Op  06/19/2018  POST-OPERATIVE DIAGNOSIS:   Sigmoid diverticulitis  SURGERY:  06/19/2018  Procedure(s): LAPAROSCOPIC ASSISTED  LEFT COLON RESECTION CYSTOSCOPY WITH BILATERAL URETERAL CATHETER PLACEMENT  SURGEON:    Surgeon(s): Bjorn PippinWrenn, John, MD Luretha MurphyMartin, Matthew, MD Glenna FellowsHoxworth, Benjamin, MD Ovidio KinNewman, David, MD  Diagnosis Colon, segmental resection, sigmoid - DIVERTICULITIS. - NO DYSPLASIA OR MALIGNANCY. Valinda HoarJULIA MANNY MD Pathologist, Electronic Signature (Case signed 06/21/2018)  Consults: None  Hospital Course:   Pleasant woman with left-sided colon diverticulitis causing stricturing and partial obstruction.  Patient underwent laparoscopic-assisted sigmoid colectomy by Dr. Daphine DeutscherMartin on the day of admission.  Postoperatively, the patient gradually mobilized and advanced to a solid diet.  Blood pressure controlled and monitored.  Pain and other symptoms were treated aggressively.    By the time of discharge, the patient was walking well the hallways, eating food, having flatus.  Pain was well-controlled on an oral medications.  Based on meeting discharge criteria and continuing to recover, I felt it was safe for the patient to be discharged from the hospital to further recover with close followup. Postoperative recommendations were discussed in detail.  They are written as well.  Discharged Condition: good  Discharge Exam: Blood pressure (!) 139/92, pulse 63, temperature 98.3 F (36.8 C), temperature source  Oral, resp. rate 16, height 5\' 3"  (1.6 m), weight 104.8 kg, SpO2 98 %.  General: Pt awake/alert/oriented x4 in No acute distress Eyes: PERRL, normal EOM.  Sclera clear.  No icterus Neuro: CN II-XII intact w/o focal sensory/motor deficits. Lymph: No head/neck/groin lymphadenopathy Psych:  No delerium/psychosis/paranoia HENT: Normocephalic, Mucus membranes moist.  No thrush Neck: Supple, No tracheal deviation Chest: No chest wall pain w good excursion CV:  Pulses intact.  Regular rhythm MS: Normal AROM mjr joints.  No obvious deformity Abdomen: Soft.  Nondistended.  Mildly tender at incisions only.  No evidence of peritonitis.  No incarcerated hernias. Ext:  SCDs BLE.  No mjr edema.  No cyanosis Skin: No petechiae / purpura   Disposition:   Follow-up Information    Luretha MurphyMartin, Matthew, MD. Schedule an appointment as soon as possible for a visit in 3 week(s).   Specialty:  General Surgery Contact information: [address] [place] [address] [phone]               Allergies as of 06/23/2018      Reactions   Codeine Itching      Medication List    STOP taking these medications   morphine 15 MG tablet Commonly known as:  MSIR     TAKE these medications   buPROPion 300 MG 24 hr tablet Commonly known as:  WELLBUTRIN XL Take 300 mg by mouth daily.   estradiol 0.05 MG/24HR patch Commonly known as:  VIVELLE-DOT Place 0.05 mg onto the skin 2 (two) times a week. On Sunday and Wednesday   EVEKEO 10 MG Tabs Generic drug:  Amphetamine Sulfate Take 10 mg by mouth daily as needed (ADHD).   hydrochlorothiazide 25 MG tablet Commonly known as:  HYDRODIURIL Take 25 mg by mouth daily.   HYDROcodone-acetaminophen 10-325 MG tablet Commonly known as:  NORCO Take 1-2 tablets by mouth every 6 (six) hours as needed for moderate pain or severe pain. What changed:    how much to take  when to take this  reasons to take this   lisinopril 40 MG  tablet Commonly known as:  PRINIVIL,ZESTRIL Take 40 mg by mouth 2 (two) times daily.   MAGNESIUM PO Take 3 capsules by mouth at bedtime.   methocarbamol 500 MG tablet Commonly known as:  ROBAXIN Take 500 mg by mouth 2 (two) times daily as needed for muscle spasms.   metoprolol succinate 25 MG 24 hr tablet Commonly known as:  TOPROL-XL Take 25 mg by mouth daily.   metoprolol tartrate 25 MG tablet Commonly known as:  LOPRESSOR Take 1 tablet (25 mg total) by mouth once.   progesterone 100 MG capsule Commonly known as:  PROMETRIUM Take 300 mg by mouth every evening.   saccharomyces boulardii 250 MG capsule Commonly known as:  FLORASTOR Take 1 capsule (250 mg total) by mouth 2 (two) times daily.   Testosterone 10 MG/ACT (2%) Gel Apply 1 application topically daily.   thyroid 180 MG tablet Commonly known as:  ARMOUR Take 180 mg by mouth daily.   ZINC PO Take 1 capsule by mouth daily.            Discharge Care Instructions  (From admission, onward)         Start     Ordered   06/23/18 0000  Discharge wound care:    Comments:  It is good for closed incisions and even open wounds to be washed every day.  Shower every day.  Short baths are fine.  Wash the incisions and wounds clean with soap & water.     You may leave closed incisions open to air if it is dry.   You may cover the incision with clean gauze & replace it after your daily shower for comfort.   If you have skin tapes (Steristrips) or skin glue (Dermabond) on your incision, leave them in place.  They will fall off on their own like a scab.  You may trim any edges that curl up with clean scissors.    If you have skin staples, set up an appointment for them to be removed in the office in 10 days after surgery.  If you have a drain, wash around the skin exit site with soap & water and place a new dressing of gauze or band aid around the skin every day.  Keep the drain site clean & dry.   06/23/18 0802           Significant Diagnostic Studies:  Results for orders placed or performed during the hospital encounter of 06/19/18 (from the past 72 hour(s))  CBC     Status: Abnormal   Collection Time: 06/21/18  4:44 AM  Result Value Ref Range   WBC 7.6 4.0 - 10.5 K/uL   RBC 3.47 (L) 3.87 - 5.11 MIL/uL   Hemoglobin 11.2 (L) 12.0 - 15.0 g/dL   HCT 16.135.6 (L) 09.636.0 - 04.546.0 %   MCV 102.6 (H) 80.0 - 100.0 fL   MCH 32.3 26.0 - 34.0 pg   MCHC 31.5 30.0 - 36.0 g/dL   RDW 40.914.2 81.111.5 - 91.415.5 %   Platelets 173 150 - 400 K/uL   nRBC 0.0 0.0 - 0.2 %    Comment: Performed at  [facility], [address] [place], [place] [address]  CBC     Status: Abnormal   Collection Time: 06/22/18  4:34 AM  Result Value Ref Range   WBC 6.7 4.0 - 10.5 K/uL   RBC 3.64 (L) 3.87 - 5.11 MIL/uL   Hemoglobin 11.4 (L) 12.0 - 15.0 g/dL   HCT 19.1 47.8 - 29.5 %   MCV 100.8 (H) 80.0 - 100.0 fL   MCH 31.3 26.0 - 34.0 pg   MCHC 31.1 30.0 - 36.0 g/dL   RDW 62.1 30.8 - 65.7 %   Platelets 200 150 - 400 K/uL   nRBC 0.0 0.0 - 0.2 %    Comment: Performed at [facility], [address], [place], [place] [address]    No results found.  Past Medical History:  Diagnosis Date  . Diverticulitis   . Diverticulosis   . GERD (gastroesophageal reflux disease) YRS AGO  . Hypertension   . Obesity   . Thyroid disease     Past Surgical History:  Procedure Laterality Date  . ABDOMINAL HYSTERECTOMY  1981   COMPLETE  . ANKLE SURGERY Right 2004/2014  . CATARACT EXTRACTION Bilateral    LEFT OCT 2018, RIGHT NOV 2019  . CESAREAN SECTION  1977 AND 1980  . COLON RESECTION Left 06/19/2018   Procedure: LAPAROSCOPIC ASSISTED  LEFT COLON RESECTION;  Surgeon: Luretha Murphy, MD;  Location: [facility];  Service: General;  Laterality: Left;  ERAS PATHWAY  . CYSTOSCOPY WITH STENT PLACEMENT Bilateral 06/19/2018   Procedure: CYSTOSCOPY WITH BILATERAL URETERAL CATHETER PLACEMENT;  Surgeon: Bjorn Pippin, MD;  Location: [facility]  [facility];  Service: Urology;  Laterality: Bilateral;  . OVARIAN CYST REMOVAL      Social History   Socioeconomic History  . Marital status: Married    Spouse name: Not on file  . Number of children: Not on file  . Years of education: Not on file  . Highest education level: Not on file  Occupational History  . Not on file  Social Needs  . Financial resource strain: Not on file  . Food insecurity:    Worry: Not on file    Inability: Not on file  . Transportation needs:    Medical: Not on file    Non-medical: Not on file  Tobacco Use  . Smoking status: Never Smoker  . Smokeless tobacco: Never Used  Substance and Sexual Activity  . Alcohol use: Yes    Alcohol/week: 10.0 standard drinks    Types: 10 Glasses of wine per week    Comment: VoDKA  or wine daily  . Drug use: No  . Sexual activity: Yes    Birth control/protection: Post-menopausal  Lifestyle  . Physical activity:    Days per week: Not on file    Minutes per session: Not on file  . Stress: Not on file  Relationships  . Social connections:    Talks on phone: Not on file    Gets together: Not on file    Attends religious service: Not on file    Active member of club or organization: Not on file    Attends meetings of clubs or organizations: Not on file    Relationship status: Not on file  . Intimate partner violence:    Fear of current or ex partner: Not on file    Emotionally abused: Not on file    Physically abused: Not on file    Forced sexual activity: Not on file  Other  Topics Concern  . Not on file  Social History Narrative  . Not on file    Family History  Problem Relation Age of Onset  . Cancer Paternal Grandmother   . Breast cancer Neg Hx     Current Facility-Administered Medications  Medication Dose Route Frequency Provider Last Rate Last Dose  . acetaminophen (TYLENOL) tablet 650 mg  650 mg Oral Q4H PRN Luretha Murphy, MD      . alvimopan (ENTEREG) capsule 12 mg  12 mg Oral BID Luretha Murphy, MD   12 mg at 06/22/18 2213  . buPROPion (WELLBUTRIN XL) 24 hr tablet 300 mg  300 mg Oral Daily Ellaina Schuler, Viviann Spare, MD      . dextrose 5 % and 0.45 % NaCl with KCl 20 mEq/L infusion   Intravenous Continuous Luretha Murphy, MD 10 mL/hr at 06/22/18 1439    . fentaNYL (SUBLIMAZE) injection 12.5 mcg  12.5 mcg Intravenous Q1H PRN Luretha Murphy, MD      . heparin injection 5,000 Units  5,000 Units Subcutaneous Q8H Luretha Murphy, MD   5,000 Units at 06/23/18 0534  . hydrALAZINE (APRESOLINE) injection 10 mg  10 mg Intravenous Q2H PRN Luretha Murphy, MD      . hydrochlorothiazide (HYDRODIURIL) tablet 25 mg  25 mg Oral Daily Karie Soda, MD      . HYDROcodone-acetaminophen (NORCO/VICODIN) 5-325 MG per tablet 1-2 tablet  1-2 tablet Oral Q4H PRN Luretha Murphy, MD   2 tablet at 06/23/18 0155  . lisinopril (PRINIVIL,ZESTRIL) tablet 40 mg  40 mg Oral BID Karie Soda, MD      . metoprolol succinate (TOPROL-XL) 24 hr tablet 25 mg  25 mg Oral Daily Luretha Murphy, MD   25 mg at 06/22/18 1059  . metoprolol tartrate (LOPRESSOR) injection 5 mg  5 mg Intravenous Q6H PRN Luretha Murphy, MD      . metoprolol tartrate (LOPRESSOR) tablet 25 mg  25 mg Oral Once Luretha Murphy, MD      . ondansetron [facility]) tablet 4 mg  4 mg Oral Q6H PRN Luretha Murphy, MD       Or  . ondansetron [facility]) injection 4 mg  4 mg Intravenous Q6H PRN Luretha Murphy, MD   4 mg at 06/21/18 1307  . saccharomyces boulardii (FLORASTOR) capsule 250 mg  250 mg Oral BID Luretha Murphy, MD   250 mg at 06/22/18 2213     Allergies  Allergen Reactions  . Codeine Itching    Signed: Lorenso Courier, MD, FACS, MASCRS Gastrointestinal and Minimally Invasive Surgery    [address] [place], [place] [address] [phone] [address] / Paging [phone] Fax   06/23/2018, 7:41 AM

## 2019-07-21 IMAGING — CT CT ABD-PELV W/ CM
2 of 5 series · 15 of 46 positions shown, 17 images · IV contrast (APPLIED)
Comparison: 01/03/2017

CLINICAL DATA: Left lower quadrant abdominal pain. Nausea,
constipation. History of diverticulosis.

EXAM:
CT ABDOMEN AND PELVIS WITH CONTRAST
TECHNIQUE: Multidetector CT imaging of the abdomen and pelvis was performed
using the standard protocol following bolus administration of
intravenous contrast.
CONTRAST:  100mL LOUR3D-7KK IOPAMIDOL (LOUR3D-7KK) INJECTION 61%

[Series 2: axial st · axial · 0.84mm/px · z∈[-476,-46]mm · 12 of 96 slices shown, 14 images]
[im 5/96  soft-tissue]
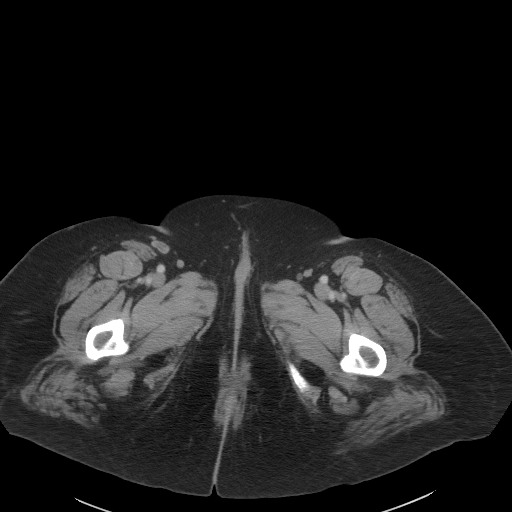
[im 5/96  bone]
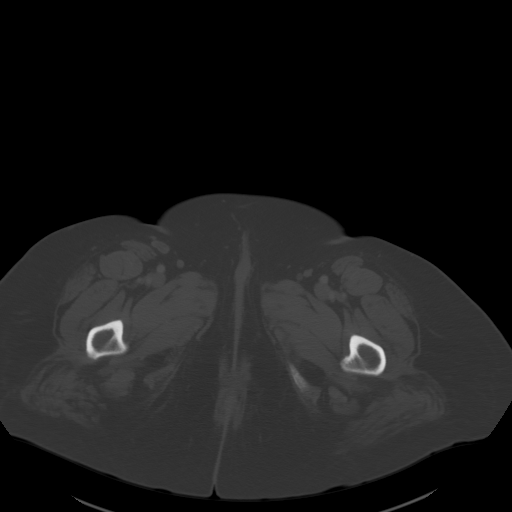
[im 15/96  soft-tissue]
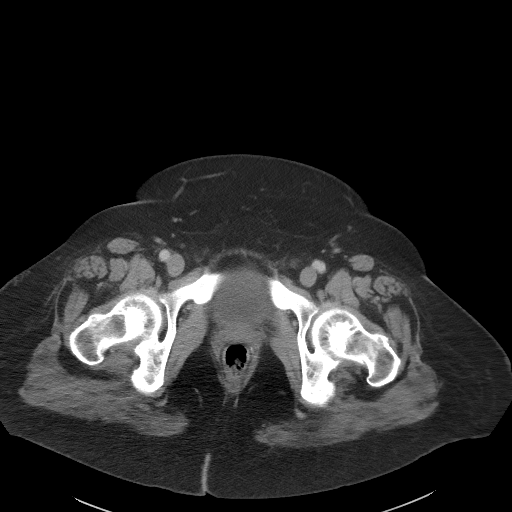
[im 20/96  soft-tissue]
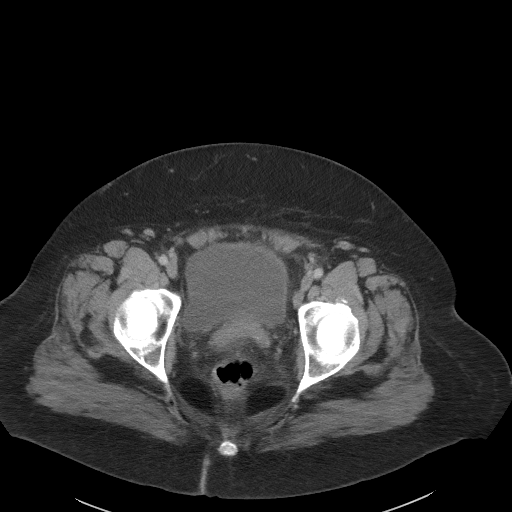
[im 29/96  soft-tissue]
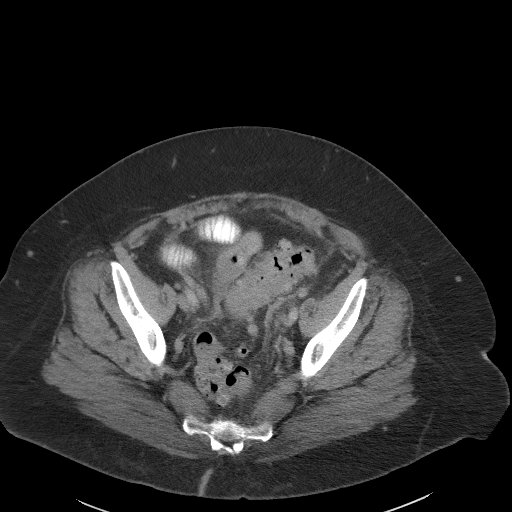
[im 39/96  soft-tissue]
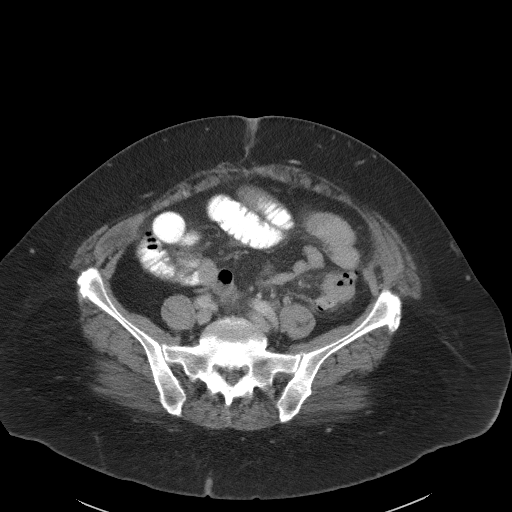
[im 43/96  soft-tissue]
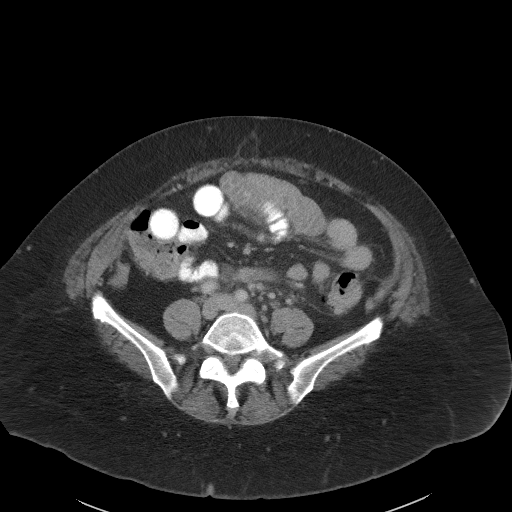
[im 53/96  soft-tissue]
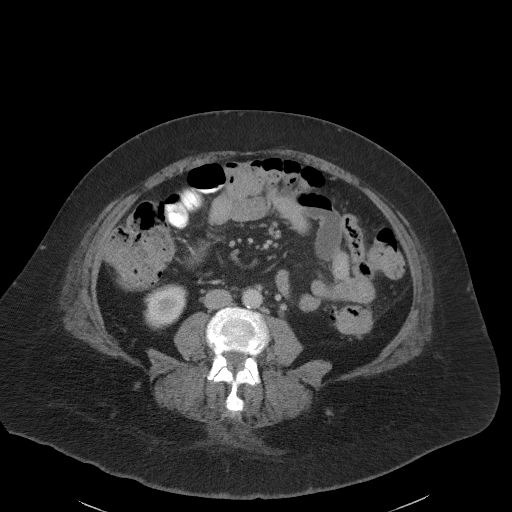
[im 58/96  soft-tissue]
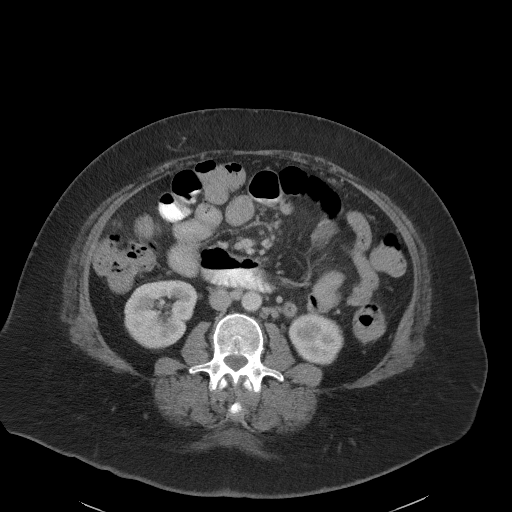
[im 67/96  soft-tissue]
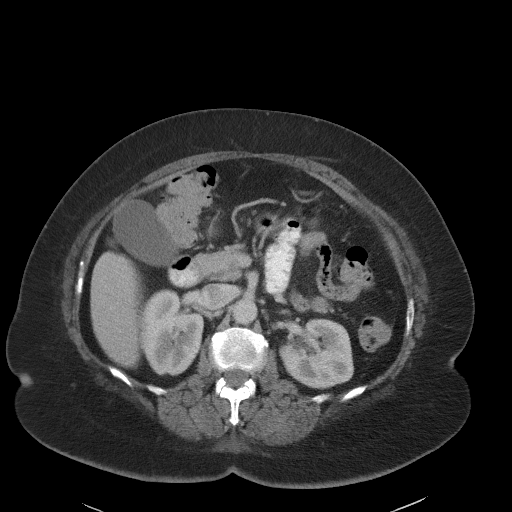
[im 67/96  bone]
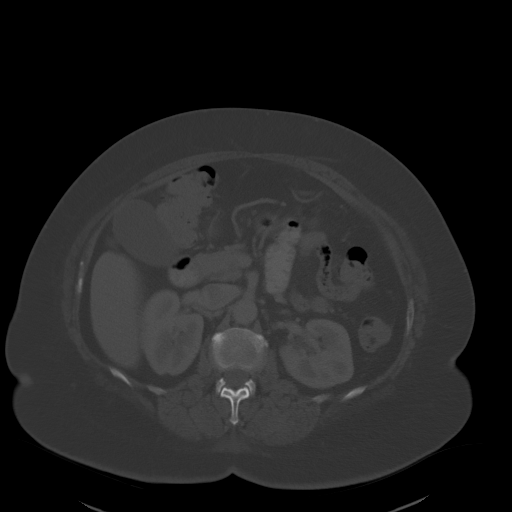
[im 77/96  soft-tissue]
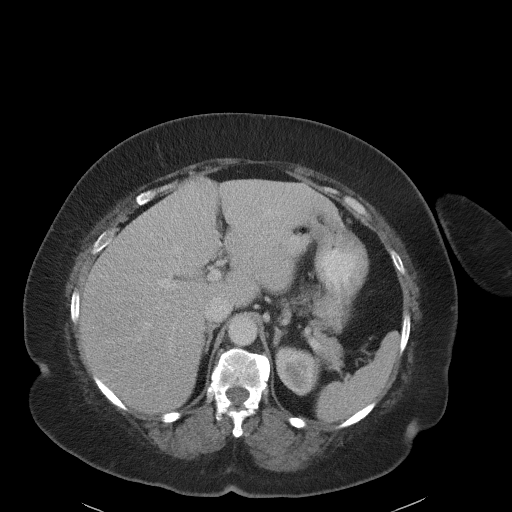
[im 81/96  soft-tissue]
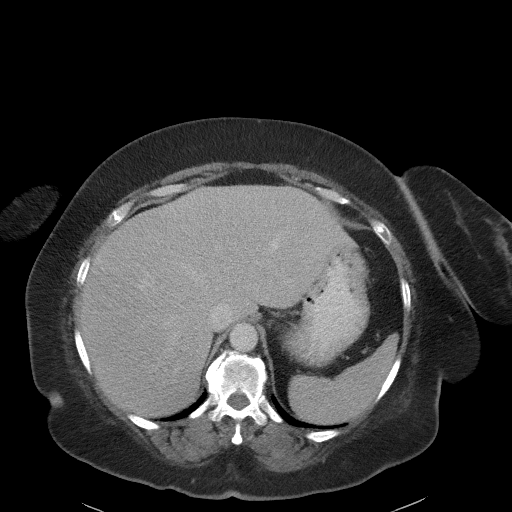
[im 91/96  soft-tissue]
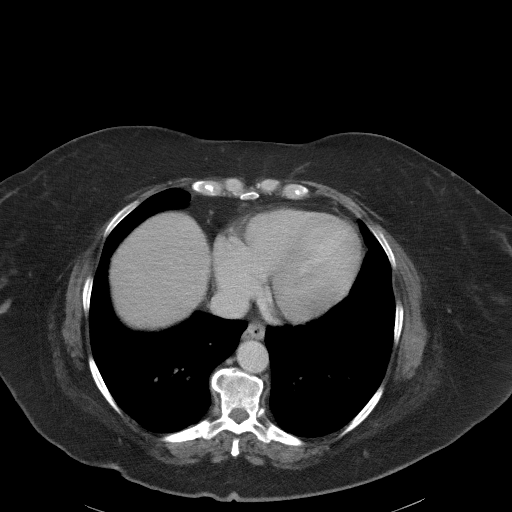

[Series 5: coronal st · coronal · 0.84mm/px · 3 of 110 slices shown]
[im 37/110  soft-tissue]
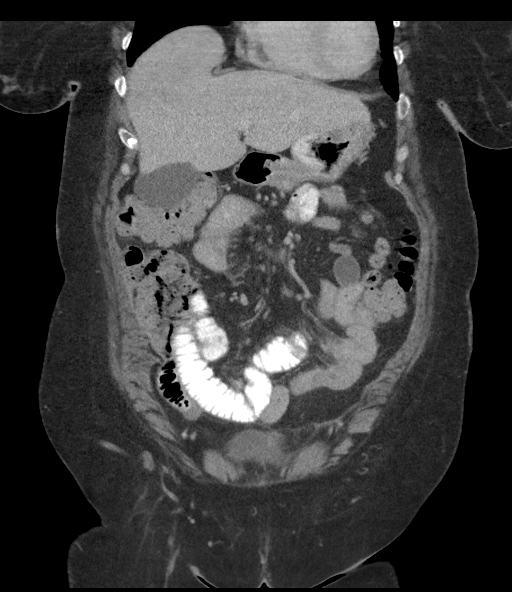
[im 49/110  soft-tissue]
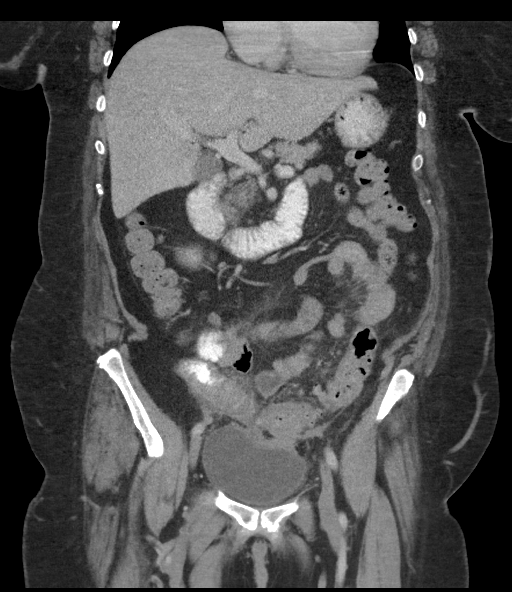
[im 61/110  soft-tissue]
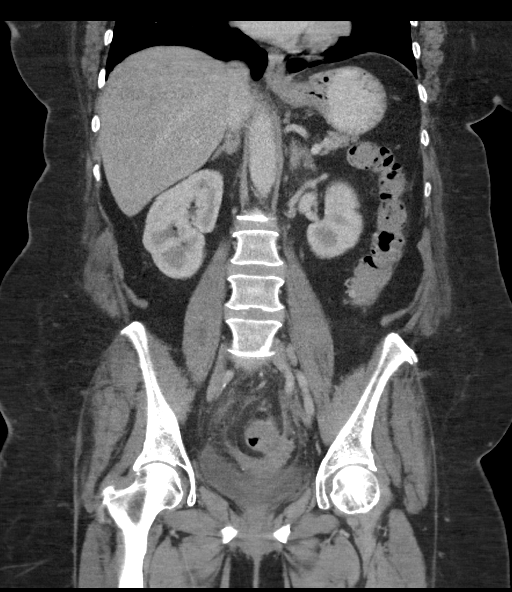

[15 of 46 positions shown; findings below may reference images not displayed]

FINDINGS: Lower chest: No acute abnormality.

Hepatobiliary: No focal liver abnormality is seen. No gallstones,
gallbladder wall thickening, or biliary dilatation.

Pancreas: Unremarkable. No pancreatic ductal dilatation or
surrounding inflammatory changes.

Spleen: Normal in size without focal abnormality.

Adrenals/Urinary Tract: Adrenal glands are unremarkable. Kidneys are
normal, without renal calculi, focal lesion, or hydronephrosis.
Bladder is unremarkable.

Stomach/Bowel: No evidence of bowel obstruction. Persistent
inflammatory changes with asymmetric mucosal thickening and
pericolonic fat stranding of the sigmoid colon, on the background of
extensive diverticulosis. No evidence of macro rupture or abscess
formation. There has been interval development of significant
circumferential mucosal thickening of an adjacent loop of the ileum,
seen on axial images 62-68/96, sequence 2, with subsequent lumen
stenosis and mild sub pathologic upstream dilation of the small
bowel. Mucosal thickening of the anterior superior urinary bladder
is also seen, with loss of fat plane between the abnormal portion of
the urinary bladder and abnormal portion of the sigmoid colon.

Vascular/Lymphatic: Minimal atherosclerotic disease of the aorta.
Shotty left retroperitoneal lymph nodes.

Reproductive: Status post hysterectomy. No adnexal masses.

Other: No abdominal wall hernia or abnormality. No abdominopelvic
ascites.

Musculoskeletal: No acute or significant osseous findings.
IMPRESSION: Persistent inflammatory changes of the sigmoid colon, on the
background of extensive diverticulosis. These findings may represent
acute to subacute diverticulitis, however underlying malignancy
cannot be excluded. No evidence of macro perforation or abscess
formation.

Interval development of circumferential mucosal thickening of an
adjacent loop of ileum, causing lumen stenosis and mild sub
pathologic upstream dilation of the small bowel.

Persistent mucosal thickening of the adjacent superior anterior
urinary bladder wall with loss of fat plane between the abnormal
portion of the urinary bladder and sigmoid colon. Enterovesicular
fistula cannot be excluded, image 47/110, sequence 5.

Shotty mesenteric and left retroperitoneal lymph nodes.

## 2020-02-20 DIAGNOSIS — Z20828 Contact with and (suspected) exposure to other viral communicable diseases: Secondary | ICD-10-CM | POA: Diagnosis not present

## 2020-02-21 DIAGNOSIS — J069 Acute upper respiratory infection, unspecified: Secondary | ICD-10-CM | POA: Diagnosis not present

## 2020-02-21 DIAGNOSIS — Z20822 Contact with and (suspected) exposure to covid-19: Secondary | ICD-10-CM | POA: Diagnosis not present

## 2020-03-13 DIAGNOSIS — L980 Pyogenic granuloma: Secondary | ICD-10-CM | POA: Diagnosis not present

## 2020-03-20 DIAGNOSIS — L980 Pyogenic granuloma: Secondary | ICD-10-CM | POA: Diagnosis not present

## 2020-04-03 ENCOUNTER — Other Ambulatory Visit: Payer: Self-pay | Admitting: Ophthalmology

## 2020-04-03 DIAGNOSIS — H11222 Conjunctival granuloma, left eye: Secondary | ICD-10-CM | POA: Diagnosis not present

## 2020-04-03 DIAGNOSIS — L980 Pyogenic granuloma: Secondary | ICD-10-CM | POA: Diagnosis not present

## 2020-04-08 DIAGNOSIS — F331 Major depressive disorder, recurrent, moderate: Secondary | ICD-10-CM | POA: Diagnosis not present

## 2020-04-08 DIAGNOSIS — F330 Major depressive disorder, recurrent, mild: Secondary | ICD-10-CM | POA: Diagnosis not present

## 2020-04-08 DIAGNOSIS — F411 Generalized anxiety disorder: Secondary | ICD-10-CM | POA: Diagnosis not present

## 2020-04-08 DIAGNOSIS — F902 Attention-deficit hyperactivity disorder, combined type: Secondary | ICD-10-CM | POA: Diagnosis not present

## 2020-05-04 DIAGNOSIS — F902 Attention-deficit hyperactivity disorder, combined type: Secondary | ICD-10-CM | POA: Diagnosis not present

## 2020-05-04 DIAGNOSIS — F331 Major depressive disorder, recurrent, moderate: Secondary | ICD-10-CM | POA: Diagnosis not present

## 2020-05-04 DIAGNOSIS — F4325 Adjustment disorder with mixed disturbance of emotions and conduct: Secondary | ICD-10-CM | POA: Diagnosis not present

## 2020-05-04 DIAGNOSIS — F411 Generalized anxiety disorder: Secondary | ICD-10-CM | POA: Diagnosis not present

## 2020-05-22 DIAGNOSIS — E538 Deficiency of other specified B group vitamins: Secondary | ICD-10-CM | POA: Diagnosis not present

## 2020-05-22 DIAGNOSIS — N951 Menopausal and female climacteric states: Secondary | ICD-10-CM | POA: Diagnosis not present

## 2020-05-22 DIAGNOSIS — Z789 Other specified health status: Secondary | ICD-10-CM | POA: Diagnosis not present

## 2020-05-22 DIAGNOSIS — F419 Anxiety disorder, unspecified: Secondary | ICD-10-CM | POA: Diagnosis not present

## 2020-05-22 DIAGNOSIS — E559 Vitamin D deficiency, unspecified: Secondary | ICD-10-CM | POA: Diagnosis not present

## 2020-05-22 DIAGNOSIS — I10 Essential (primary) hypertension: Secondary | ICD-10-CM | POA: Diagnosis not present

## 2020-05-22 DIAGNOSIS — E039 Hypothyroidism, unspecified: Secondary | ICD-10-CM | POA: Diagnosis not present

## 2020-06-18 DIAGNOSIS — F411 Generalized anxiety disorder: Secondary | ICD-10-CM | POA: Diagnosis not present

## 2020-06-18 DIAGNOSIS — F331 Major depressive disorder, recurrent, moderate: Secondary | ICD-10-CM | POA: Diagnosis not present

## 2020-06-18 DIAGNOSIS — F4325 Adjustment disorder with mixed disturbance of emotions and conduct: Secondary | ICD-10-CM | POA: Diagnosis not present

## 2020-06-18 DIAGNOSIS — F902 Attention-deficit hyperactivity disorder, combined type: Secondary | ICD-10-CM | POA: Diagnosis not present

## 2020-09-09 DIAGNOSIS — E039 Hypothyroidism, unspecified: Secondary | ICD-10-CM | POA: Diagnosis not present

## 2020-09-09 DIAGNOSIS — E559 Vitamin D deficiency, unspecified: Secondary | ICD-10-CM | POA: Diagnosis not present

## 2020-09-09 DIAGNOSIS — Z9189 Other specified personal risk factors, not elsewhere classified: Secondary | ICD-10-CM | POA: Diagnosis not present

## 2020-09-09 DIAGNOSIS — E538 Deficiency of other specified B group vitamins: Secondary | ICD-10-CM | POA: Diagnosis not present

## 2020-09-09 DIAGNOSIS — R5383 Other fatigue: Secondary | ICD-10-CM | POA: Diagnosis not present

## 2020-09-09 DIAGNOSIS — N951 Menopausal and female climacteric states: Secondary | ICD-10-CM | POA: Diagnosis not present

## 2020-10-28 ENCOUNTER — Telehealth: Payer: Self-pay | Admitting: Adult Health

## 2020-10-28 NOTE — Telephone Encounter (Signed)
Called to discuss with patient about COVID-19 symptoms and the use of one of the available treatments for those with mild to moderate Covid symptoms and at a high risk of hospitalization.  Pt appears to qualify for outpatient treatment due to co-morbid conditions and/or a member of an at-risk group in accordance with the FDA Emergency Use Authorization.      Unable to reach pt - LMOM   Shannon Reyes Shannon Reyes   

## 2020-10-30 ENCOUNTER — Telehealth: Payer: BC Managed Care – PPO | Admitting: Nurse Practitioner

## 2020-10-30 ENCOUNTER — Encounter: Payer: Self-pay | Admitting: Nurse Practitioner

## 2020-10-30 ENCOUNTER — Other Ambulatory Visit (HOSPITAL_COMMUNITY): Payer: Self-pay

## 2020-10-30 DIAGNOSIS — R059 Cough, unspecified: Secondary | ICD-10-CM | POA: Diagnosis not present

## 2020-10-30 DIAGNOSIS — M791 Myalgia, unspecified site: Secondary | ICD-10-CM | POA: Diagnosis not present

## 2020-10-30 DIAGNOSIS — U071 COVID-19: Secondary | ICD-10-CM

## 2020-10-30 MED ORDER — NIRMATRELVIR/RITONAVIR (PAXLOVID)TABLET
3.0000 | ORAL_TABLET | Freq: Two times a day (BID) | ORAL | 0 refills | Status: AC
  Filled 2020-10-30: qty 30, 5d supply, fill #0

## 2020-10-30 MED ORDER — NAPROXEN 500 MG PO TABS
500.0000 mg | ORAL_TABLET | Freq: Two times a day (BID) | ORAL | 1 refills | Status: DC
  Filled 2020-10-30: qty 60, 30d supply, fill #0

## 2020-10-30 MED ORDER — BENZONATATE 100 MG PO CAPS
100.0000 mg | ORAL_CAPSULE | Freq: Three times a day (TID) | ORAL | 0 refills | Status: DC | PRN
  Filled 2020-10-30: qty 20, 6d supply, fill #0

## 2020-10-30 NOTE — Progress Notes (Signed)
Ms. season, astacio are scheduled for a virtual visit with your provider today.    Just as we do with appointments in the office, we must obtain your consent to participate.  Your consent will be active for this visit and any virtual visit you may have with one of our providers in the next 365 days.    If you have a MyChart account, I can also send a copy of this consent to you electronically.  All virtual visits are billed to your insurance company just like a traditional visit in the office.  As this is a virtual visit, video technology does not allow for your provider to perform a traditional examination.  This may limit your provider's ability to fully assess your condition.  If your provider identifies any concerns that need to be evaluated in person or the need to arrange testing such as labs, EKG, etc, we will make arrangements to do so.    Although advances in technology are sophisticated, we cannot ensure that it will always work on either your end or our end.  If the connection with a video visit is poor, we may have to switch to a telephone visit.  With either a video or telephone visit, we are not always able to ensure that we have a secure connection.   I need to obtain your verbal consent now.   Are you willing to proceed with your visit today?   Shannon Reyes has provided verbal consent on 10/30/2020 for a virtual visit (video or telephone).  Virtual Visit via Video  I connected with  Shannon Reyes  on 10/30/20 at 7:35 by video and verified that I am speaking with the correct person using two identifiers. Shannon Reyes is currently located at home and her husband is currently with her during visit. The provider, Mary-Margaret Daphine Deutscher, FNP is located at home at time of visit.  I discussed the limitations, risks, security and privacy concerns of performing an evaluation and management service by video  and the availability of in person appointments. I also discussed with the patient that there may be a  patient responsible charge related to this service. The patient expressed understanding and agreed to proceed.   Subjective:   HPI:   Patient does video visit stating she ust tested positive for covid. Her husband tested positive for covid earlier in the week. She is currently experiencing body aches, cough and congestion.  Review of Systems  Constitutional: Negative for chills and fever.  HENT: Positive for congestion. Negative for ear discharge and ear pain.   Respiratory: Positive for cough and sputum production.   Musculoskeletal: Positive for myalgias.  Neurological: Negative for dizziness and headaches.     See pertinent positives and negatives per HPI.  Patient Active Problem List   Diagnosis Date Noted  . Diverticulitis of sigmoid colon s/p lap colectomy 06/19/2018 06/23/2018  . S/P partial colectomy 06/19/2018  . GERD (gastroesophageal reflux disease) 01/25/2013  . HTN (hypertension) 12/31/2012  . Murmur 12/31/2012  . 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 Elevated LDL cholesterol level 12/31/2012  . Preop cardiovascular exam 12/31/2012  . Morbid obesity-BMI 41 12/06/2012    Social History   Tobacco Use  . Smoking status: Never Smoker  . Smokeless tobacco: Never Used  Substance Use Topics  . Alcohol use: Yes    Alcohol/week: 10.0 standard drinks    Types: 10 Glasses of wine per week    Comment: VoDKA  or wine daily  Current Outpatient Medications:  .  buPROPion (WELLBUTRIN XL) 300 MG 24 hr tablet, Take 300 mg by mouth daily., Disp: , Rfl:  .  estradiol (VIVELLE-DOT) 0.05 MG/24HR patch, Place 0.05 mg onto the skin 2 (two) times a week. On Sunday and Wednesday, Disp: , Rfl: 2 .  EVEKEO 10 MG TABS, Take 10 mg by mouth daily as needed (ADHD). , Disp: , Rfl: 0 .  hydrochlorothiazide (HYDRODIURIL) 25 MG tablet, Take 25 mg by mouth daily., Disp: , Rfl:  .  HYDROcodone-acetaminophen (NORCO) 10-325 MG tablet, Take 1-2 tablets by mouth every 6 (six) hours as needed for  moderate pain or severe pain., Disp: 30 tablet, Rfl: 0 .  lisinopril (PRINIVIL,ZESTRIL) 40 MG tablet, Take 40 mg by mouth 2 (two) times daily. , Disp: , Rfl: 2 .  MAGNESIUM PO, Take 3 capsules by mouth at bedtime., Disp: , Rfl:  .  methocarbamol (ROBAXIN) 500 MG tablet, Take 500 mg by mouth 2 (two) times daily as needed for muscle spasms. , Disp: , Rfl: 0 .  metoprolol succinate (TOPROL-XL) 25 MG 24 hr tablet, Take 25 mg by mouth daily., Disp: , Rfl: 1 .  metoprolol tartrate (LOPRESSOR) 25 MG tablet, Take 1 tablet (25 mg total) by mouth once., Disp: 1 tablet, Rfl: 3 .  Multiple Vitamins-Minerals (ZINC PO), Take 1 capsule by mouth daily., Disp: , Rfl:  .  progesterone (PROMETRIUM) 100 MG capsule, Take 300 mg by mouth every evening. , Disp: , Rfl: 2 .  saccharomyces boulardii (FLORASTOR) 250 MG capsule, Take 1 capsule (250 mg total) by mouth 2 (two) times daily., Disp: 30 capsule, Rfl: 0 .  Testosterone 10 MG/ACT (2%) GEL, Apply 1 application topically daily. , Disp: , Rfl:  .  thyroid (ARMOUR) 180 MG tablet, Take 180 mg by mouth daily., Disp: , Rfl:   Allergies  Allergen Reactions  . Codeine Itching    Objective:   There were no vitals taken for this visit.  Patient is well-developed, well-nourished in no acute distress.  Resting comfortably  at home.  Head is normocephalic, atraumatic.  No labored breathing.  Speech is clear and coherent with logical content.  Patient is alert and oriented at baseline.  No SOB or c  Assessment and Plan:        Shannon Reyes in today with chief complaint of No chief complaint on file.   1. Lab test positive for detection of COVID-19 virus  2. Cough 3. Myalgia 1. Take meds as prescribed 2. Use a cool mist humidifier especially during the winter months and when heat has been humid. 3. Use saline nose sprays frequently 4. Saline irrigations of the nose can be very helpful if done frequently.  * 4X daily for 1 week*  * Use of a nettie pot can be  helpful with this. Follow directions with this* 5. Drink plenty of fluids 6. Keep thermostat turn down low 7.For any cough or congestion  Use plain Mucinex- regular strength or max strength is fine   * Children- consult with Pharmacist for dosing 8. For fever or aces or pains- take tylenol or ibuprofen appropriate for age and weight.  * for fevers greater than 101 orally you may alternate ibuprofen and tylenol every  3 hours.   Meds ordered this encounter  Medications  . naproxen (NAPROSYN) 500 MG tablet    Sig: Take 1 tablet (500 mg total) by mouth 2 (two) times daily with a meal.    Dispense:  60  tablet    Refill:  1    Order Specific Question:   Supervising Provider    Answer:   MILLER, BRIAN [[id number]]  . benzonatate (TESSALON PERLES) 100 MG capsule    Sig: Take 1 capsule (100 mg total) by mouth 3 (three) times daily as needed for cough.    Dispense:  20 capsule    Refill:  0    Order Specific Question:   Supervising Provider    Answer:   MILLER, BRIAN [[id number]]  . nirmatrelvir/ritonavir EUA (PAXLOVID) TABS    Sig: Take 3 tablets by mouth 2 (two) times daily for 5 days. Patient GFR is is unknown. Last creatine was .54 in 2020. Take nirmatrelvir (150 mg) two tablets twice daily for 5 days and ritonavir (100 mg) one tablet twice daily for 5 days.    Dispense:  30 tablet    Refill:  0    Order Specific Question:   Supervising Provider    Answer:   Eber Hong [[id number]]        The above assessment and management plan was discussed with the patient. The patient verbalized understanding of and has agreed to the management plan. Patient is aware to call the clinic if symptoms persist or worsen. Patient is aware when to return to the clinic for a follow-up visit. Patient educated on when it is appropriate to go to the emergency department.   Mary-Margaret Daphine Deutscher, FNP  .   Mary-Margaret Daphine Deutscher, FNP 10/30/2020  Time spent with the patient: 10 minutes, of which >50% was spent in  obtaining information about symptoms, reviewing previous labs, evaluations, and treatments, counseling about condition (please see the discussed topics above), and developing a plan to further investigate it; had a number of questions which I addressed.

## 2020-12-24 DIAGNOSIS — N951 Menopausal and female climacteric states: Secondary | ICD-10-CM | POA: Diagnosis not present

## 2020-12-24 DIAGNOSIS — R5383 Other fatigue: Secondary | ICD-10-CM | POA: Diagnosis not present

## 2020-12-24 DIAGNOSIS — E039 Hypothyroidism, unspecified: Secondary | ICD-10-CM | POA: Diagnosis not present

## 2020-12-24 DIAGNOSIS — Z9189 Other specified personal risk factors, not elsewhere classified: Secondary | ICD-10-CM | POA: Diagnosis not present

## 2021-01-06 DIAGNOSIS — F331 Major depressive disorder, recurrent, moderate: Secondary | ICD-10-CM | POA: Diagnosis not present

## 2021-01-06 DIAGNOSIS — F411 Generalized anxiety disorder: Secondary | ICD-10-CM | POA: Diagnosis not present

## 2021-01-06 DIAGNOSIS — F902 Attention-deficit hyperactivity disorder, combined type: Secondary | ICD-10-CM | POA: Diagnosis not present

## 2021-01-06 DIAGNOSIS — F4325 Adjustment disorder with mixed disturbance of emotions and conduct: Secondary | ICD-10-CM | POA: Diagnosis not present

## 2021-03-25 DIAGNOSIS — E039 Hypothyroidism, unspecified: Secondary | ICD-10-CM | POA: Diagnosis not present

## 2021-03-25 DIAGNOSIS — E538 Deficiency of other specified B group vitamins: Secondary | ICD-10-CM | POA: Diagnosis not present

## 2021-03-25 DIAGNOSIS — E559 Vitamin D deficiency, unspecified: Secondary | ICD-10-CM | POA: Diagnosis not present

## 2021-03-25 DIAGNOSIS — Z13220 Encounter for screening for lipoid disorders: Secondary | ICD-10-CM | POA: Diagnosis not present

## 2021-03-25 DIAGNOSIS — N951 Menopausal and female climacteric states: Secondary | ICD-10-CM | POA: Diagnosis not present

## 2021-03-25 DIAGNOSIS — I10 Essential (primary) hypertension: Secondary | ICD-10-CM | POA: Diagnosis not present

## 2021-03-25 DIAGNOSIS — R5383 Other fatigue: Secondary | ICD-10-CM | POA: Diagnosis not present

## 2021-03-25 DIAGNOSIS — Z131 Encounter for screening for diabetes mellitus: Secondary | ICD-10-CM | POA: Diagnosis not present

## 2021-04-14 DIAGNOSIS — T161XXA Foreign body in right ear, initial encounter: Secondary | ICD-10-CM | POA: Diagnosis not present

## 2021-06-21 DIAGNOSIS — F902 Attention-deficit hyperactivity disorder, combined type: Secondary | ICD-10-CM | POA: Diagnosis not present

## 2021-06-21 DIAGNOSIS — F331 Major depressive disorder, recurrent, moderate: Secondary | ICD-10-CM | POA: Diagnosis not present

## 2021-06-21 DIAGNOSIS — F4325 Adjustment disorder with mixed disturbance of emotions and conduct: Secondary | ICD-10-CM | POA: Diagnosis not present

## 2021-06-21 DIAGNOSIS — F411 Generalized anxiety disorder: Secondary | ICD-10-CM | POA: Diagnosis not present

## 2021-06-24 DIAGNOSIS — I10 Essential (primary) hypertension: Secondary | ICD-10-CM | POA: Diagnosis not present

## 2021-06-24 DIAGNOSIS — R5383 Other fatigue: Secondary | ICD-10-CM | POA: Diagnosis not present

## 2021-06-24 DIAGNOSIS — E039 Hypothyroidism, unspecified: Secondary | ICD-10-CM | POA: Diagnosis not present

## 2021-06-24 DIAGNOSIS — E559 Vitamin D deficiency, unspecified: Secondary | ICD-10-CM | POA: Diagnosis not present

## 2021-06-24 DIAGNOSIS — E538 Deficiency of other specified B group vitamins: Secondary | ICD-10-CM | POA: Diagnosis not present

## 2021-06-24 DIAGNOSIS — N951 Menopausal and female climacteric states: Secondary | ICD-10-CM | POA: Diagnosis not present

## 2021-07-12 ENCOUNTER — Ambulatory Visit: Payer: BC Managed Care – PPO | Admitting: Nurse Practitioner

## 2021-07-12 ENCOUNTER — Other Ambulatory Visit: Payer: Self-pay

## 2021-07-12 ENCOUNTER — Telehealth: Payer: Self-pay | Admitting: Nurse Practitioner

## 2021-07-12 ENCOUNTER — Encounter: Payer: Self-pay | Admitting: Nurse Practitioner

## 2021-07-12 VITALS — BP 136/80 | HR 77 | Temp 97.4°F | Ht 64.0 in | Wt 210.2 lb

## 2021-07-12 DIAGNOSIS — Z1231 Encounter for screening mammogram for malignant neoplasm of breast: Secondary | ICD-10-CM

## 2021-07-12 DIAGNOSIS — E7800 Pure hypercholesterolemia, unspecified: Secondary | ICD-10-CM | POA: Diagnosis not present

## 2021-07-12 DIAGNOSIS — E039 Hypothyroidism, unspecified: Secondary | ICD-10-CM | POA: Diagnosis not present

## 2021-07-12 DIAGNOSIS — R011 Cardiac murmur, unspecified: Secondary | ICD-10-CM

## 2021-07-12 DIAGNOSIS — Z90721 Acquired absence of ovaries, unilateral: Secondary | ICD-10-CM

## 2021-07-12 DIAGNOSIS — I10 Essential (primary) hypertension: Secondary | ICD-10-CM | POA: Diagnosis not present

## 2021-07-12 DIAGNOSIS — Z90710 Acquired absence of both cervix and uterus: Secondary | ICD-10-CM | POA: Insufficient documentation

## 2021-07-12 MED ORDER — HYDROCHLOROTHIAZIDE 25 MG PO TABS: 25.0000 mg | ORAL_TABLET | Freq: Every day | ORAL | 1 refills | Status: DC

## 2021-07-12 MED ORDER — METOPROLOL TARTRATE 25 MG PO TABS: 25.0000 mg | ORAL_TABLET | Freq: Every day | ORAL | 1 refills | Status: DC

## 2021-07-12 NOTE — Telephone Encounter (Signed)
See message below °

## 2021-07-12 NOTE — Progress Notes (Signed)
Subjective:  Patient ID: Shannon Reyes, female    DOB: 1951/04/10  Age: 71 y.o. MRN: [id number]  CC: Establish Care (New patient/Follow up on HTN. )  HPI   Elevated LDL cholesterol level Reviewed lipid panel completed by [facility] on 06/24/2021: TC 209, LDL 133, HDL 59, Trig 78. Calculated ASCVD risk of 12.7% compared to 6.2% FHx of CAD: father Advised about the need for Statin drug to prevent cardiovascular or cerbrovascular disease over the next 66yrs. Also advised about the importance of low fat/low carb diet and regular exercise. She declined to stake any statin, due to hx of intolerance after taking one x 1year (muscle weakness and brain fog). She does not remember name of statin. Repeat lipid panel in 2months.  HTN (hypertension) Diagnosed 17yrs ago Current use of metoprolol, lisinopril and HCTZ Reviewed CMP completed by [facility] on 06/24/2021: normal BP Readings from Last 3 Encounters:  07/12/21 136/80  06/23/18 (!) 139/92  06/15/18 (!) 153/85   she want to decrease number of BP medications. She does not check BP at home.  Maintain current medications. Monitor BP 3x/week in AM Bring BP readings to next office visit Maintain heart healthy diet.  Hypothyroidism Managed by [facility] Current use of levothyroxine 40mcg and armour thyroid 180mg  per patient  Systolic murmur Harsh systolic murmur 2/6 Asymptomatic Last echo completed 04/2016:" Left ventricle: The cavity size was normal. Wall thickness was normal. Systolic function was normal. The estimated ejection fraction was in the range of 55% to 60%. Wall motion was normal; there were no regional wall motion abnormalities. Left ventricular diastolic function parameters were normal. Pulmonary arteries: Systolic pressure was mildly to moderately increased. PA peak pressure: 40 mm Hg (S). Normal aortic, mitral and pulmonic valves.trivial tricuspid valve  regurgitation"  Repeat echocardiogram if becomes symptomatic.  Reviewed past Medical, Social and Family history today.  Outpatient Medications Prior to Visit  Medication Sig Dispense Refill   buPROPion (WELLBUTRIN XL) 300 MG 24 hr tablet Take 300 mg by mouth daily.     erythromycin ophthalmic ointment erythromycin 5 mg/gram (0.5 %) eye ointment     estradiol (VIVELLE-DOT) 0.075 MG/24HR estradiol 0.075 mg/24 hr semiweekly transdermal patch     EVEKEO 10 MG TABS Take 10 mg by mouth daily as needed (ADHD).   0   levothyroxine (SYNTHROID) 50 MCG tablet Take 50 mcg by mouth daily before breakfast.     lisinopril (PRINIVIL,ZESTRIL) 40 MG tablet Take 40 mg by mouth 2 (two) times daily.   2   MAGNESIUM PO Take 3 capsules by mouth at bedtime.     methocarbamol (ROBAXIN) 500 MG tablet Take 500 mg by mouth 2 (two) times daily as needed for muscle spasms.   0   Multiple Vitamins-Minerals (ZINC PO) Take 1 capsule by mouth daily.     naproxen (NAPROSYN) 500 MG tablet Take 1 tablet (500 mg total) by mouth 2 (two) times daily with a meal. 60 tablet 1   progesterone (PROMETRIUM) 100 MG capsule Take 300 mg by mouth every evening.   2   saccharomyces boulardii (FLORASTOR) 250 MG capsule Take 1 capsule (250 mg total) by mouth 2 (two) times daily. 30 capsule 0   Testosterone 10 MG/ACT (2%) GEL Apply 1 application topically daily.      thyroid (ARMOUR) 180 MG tablet Take 180 mg by mouth daily.     estradiol (VIVELLE-DOT) 0.05 MG/24HR patch Place 0.05 mg onto the skin 2 (two) times a week.  On Sunday and Wednesday  2   hydrochlorothiazide (HYDRODIURIL) 25 MG tablet Take 25 mg by mouth daily.     HYDROcodone-acetaminophen (NORCO) 10-325 MG tablet Take 1-2 tablets by mouth every 6 (six) hours as needed for moderate pain or severe pain. 30 tablet 0   metoprolol succinate (TOPROL-XL) 25 MG 24 hr tablet Take 25 mg by mouth daily.  1   benzonatate (TESSALON PERLES) 100 MG capsule Take 1 capsule (100 mg total) by mouth 3  (three) times daily as needed for cough. (Patient not taking: Reported on 07/12/2021) 20 capsule 0   metoprolol tartrate (LOPRESSOR) 25 MG tablet Take 1 tablet (25 mg total) by mouth once. 1 tablet 3   No facility-administered medications prior to visit.   ROS See HPI  Objective:  BP 136/80 (BP Location: Left Arm, Patient Position: Sitting, Cuff Size: Large)    Pulse 77    Temp (!) 97.4 F (36.3 C) (Temporal)    Ht 5\' 4"  (1.626 m)    Wt 210 lb 3.2 oz (95.3 kg)    SpO2 99%    BMI 36.08 kg/m   Physical Exam Cardiovascular:     Rate and Rhythm: Normal rate and regular rhythm.     Pulses: Normal pulses.     Heart sounds: Murmur heard.  Pulmonary:     Effort: Pulmonary effort is normal.     Breath sounds: Normal breath sounds.  Musculoskeletal:     Right lower leg: No edema.     Left lower leg: No edema.  Neurological:     Mental Status: She is alert.  Psychiatric:        Mood and Affect: Mood normal.        Behavior: Behavior normal.   Assessment & Plan:  This visit occurred during the SARS-CoV-2 public health emergency.  Safety protocols were in place, including screening questions prior to the visit, additional usage of staff PPE, and extensive cleaning of exam room while observing appropriate contact time as indicated for disinfecting solutions.   Shannon Reyes was seen today for establish care.  Diagnoses and all orders for this visit:  Essential hypertension -     metoprolol tartrate (LOPRESSOR) 25 MG tablet; Take 1 tablet (25 mg total) by mouth daily at 12 noon. -     hydrochlorothiazide (HYDRODIURIL) 25 MG tablet; Take 1 tablet (25 mg total) by mouth daily.  Breast cancer screening by mammogram -     MM 3D SCREEN BREAST BILATERAL; Future  Hypercholesteremia  Primary hypertension  Hypothyroidism, unspecified type  S/P hysterectomy with oophorectomy  Systolic murmur   Problem List Items Addressed This Visit       Cardiovascular and Mediastinum   HTN (hypertension)     Diagnosed 32yrs ago Current use of metoprolol, lisinopril and HCTZ Reviewed CMP completed by [facility] on 06/24/2021: normal BP Readings from Last 3 Encounters:  07/12/21 136/80  06/23/18 (!) 139/92  06/15/18 (!) 153/85   she want to decrease number of BP medications. She does not check BP at home.  Maintain current medications. Monitor BP 3x/week in AM Bring BP readings to next office visit Maintain heart healthy diet.      Relevant Medications   metoprolol tartrate (LOPRESSOR) 25 MG tablet   hydrochlorothiazide (HYDRODIURIL) 25 MG tablet     Endocrine   Hypothyroidism    Managed by [facility] Current use of levothyroxine 32mcg and armour thyroid 180mg  per patient      Relevant Medications  levothyroxine (SYNTHROID) 50 MCG tablet   metoprolol tartrate (LOPRESSOR) 25 MG tablet     Other   Elevated LDL cholesterol level    Reviewed lipid panel completed by [facility] on 06/24/2021: TC 209, LDL 133, HDL 59, Trig 78. Calculated ASCVD risk of 12.7% compared to 6.2% FHx of CAD: father Advised about the need for Statin drug to prevent cardiovascular or cerbrovascular disease over the next 6yrs. Also advised about the importance of low fat/low carb diet and regular exercise. She declined to stake any statin, due to hx of intolerance after taking one x 1year (muscle weakness and brain fog). She does not remember name of statin. Repeat lipid panel in 73months.      S/P hysterectomy with oophorectomy   Systolic murmur    Harsh systolic murmur 2/6 Asymptomatic Last echo completed 04/2016:" Left ventricle: The cavity size was normal. Wall thickness was normal. Systolic function was normal. The estimated ejection fraction was in the range of 55% to 60%. Wall motion was normal; there were no regional wall motion abnormalities. Left ventricular diastolic function parameters were normal. Pulmonary arteries: Systolic  pressure was mildly to moderately increased. PA peak pressure: 40 mm Hg (S). Normal aortic, mitral and pulmonic valves.trivial tricuspid valve regurgitation"  Repeat echocardiogram if becomes symptomatic.      Other Visit Diagnoses     Essential hypertension    -  Primary   Relevant Medications   metoprolol tartrate (LOPRESSOR) 25 MG tablet   hydrochlorothiazide (HYDRODIURIL) 25 MG tablet   Breast cancer screening by mammogram       Relevant Orders   MM 3D SCREEN BREAST BILATERAL       Follow-up: Return in about 3 months (around 10/10/2021) for HTN and , hyperlipidemia (fasting).  Wilfred Lacy, NP

## 2021-07-12 NOTE — Assessment & Plan Note (Addendum)
Diagnosed 39yrs ago Current use of metoprolol, lisinopril and HCTZ Reviewed CMP completed by [facility] on 06/24/2021: normal BP Readings from Last 3 Encounters:  07/12/21 136/80  06/23/18 (!) 139/92  06/15/18 (!) 153/85   she want to decrease number of BP medications. She does not check BP at home.  Maintain current medications. Monitor BP 3x/week in AM Bring BP readings to next office visit Maintain heart healthy diet.

## 2021-07-12 NOTE — Assessment & Plan Note (Signed)
Reviewed lipid panel completed by [facility] on 06/24/2021: TC 209, LDL 133, HDL 59, Trig 78. Calculated ASCVD risk of 12.7% compared to 6.2% FHx of CAD: father Advised about the need for Statin drug to prevent cardiovascular or cerbrovascular disease over the next 35yrs. Also advised about the importance of low fat/low carb diet and regular exercise. She declined to stake any statin, due to hx of intolerance after taking one x 1year (muscle weakness and brain fog). She does not remember name of statin. Repeat lipid panel in 48months.

## 2021-07-12 NOTE — Patient Instructions (Addendum)
Need CMP and lipid panel results for last 81yrs.  Maintain current medications. Monitor BP 3x/week in AM Bring BP readings to next office visit Maintain heart healthy diet.  You will be contacted to schedule appt for mammogram  Thank you for choosing [facility]  Dyslipidemia Dyslipidemia is an imbalance of waxy, fat-like substances (lipids) in the blood. The body needs lipids in small amounts. Dyslipidemia often involves a high level of cholesterol or triglycerides, which are types of lipids. Common forms of dyslipidemia include: High levels of LDL cholesterol. LDL is the type of cholesterol that causes fatty deposits (plaques) to build up in the blood vessels that carry blood away from the heart (arteries). Low levels of HDL cholesterol. HDL cholesterol is the type of cholesterol that protects against heart disease. High levels of HDL remove the LDL buildup from arteries. High levels of triglycerides. Triglycerides are a fatty substance in the blood that is linked to a buildup of plaques in the arteries. What are the causes? There are two main types of dyslipidemia: primary and secondary. Primary dyslipidemia is caused by changes (mutations) in genes that are passed down through families (inherited). These mutations cause several types of dyslipidemia. Secondary dyslipidemia may be caused by various risk factors that can lead to the disease, such as lifestyle choices and certain medical conditions. What increases the risk? You are more likely to develop this condition if you are an older man or if you are a woman who has gone through menopause. Other risk factors include: Having a family history of dyslipidemia. Taking certain medicines, including birth control pills, steroids, some diuretics, and beta-blockers. Eating a diet high in saturated fat. Smoking cigarettes or excessive alcohol intake. Having certain medical conditions such as diabetes, polycystic ovary syndrome (PCOS),  kidney disease, liver disease, or hypothyroidism. Not exercising regularly. Being overweight or obese with too much belly fat. What are the signs or symptoms? In most cases, dyslipidemia does not usually cause any symptoms. In severe cases, very high lipid levels can cause: Fatty bumps under the skin (xanthomas). A white or gray ring around the black center (pupil) of the eye. Very high triglyceride levels can cause inflammation of the pancreas (pancreatitis). How is this diagnosed? Your health care provider may diagnose dyslipidemia based on a routine blood test (fasting blood test). Because most people do not have symptoms of the condition, this blood testing (lipid profile) is done on adults age 20 and older and is repeated every 4-6 years. This test checks: Total cholesterol. This measures the total amount of cholesterol in your blood, including LDL cholesterol, HDL cholesterol, and triglycerides. A healthy number is below 200 mg/dL (5.17 mmol/L). LDL cholesterol. The target number for LDL cholesterol is different for each person, depending on individual risk factors. A healthy number is usually below 100 mg/dL (2.59 mmol/L). Ask your health care provider what your LDL cholesterol should be. HDL cholesterol. An HDL level of 60 mg/dL (1.55 mmol/L) or higher is best because it helps to protect against heart disease. A number below 40 mg/dL (1.03 mmol/L) for men or below 50 mg/dL (1.29 mmol/L) for women increases the risk for heart disease. Triglycerides. A healthy triglyceride number is below 150 mg/dL (1.69 mmol/L). If your lipid profile is abnormal, your health care provider may do other blood tests. How is this treated? Treatment depends on the type of dyslipidemia that you have and your other risk factors for heart disease and stroke. Your health care provider will have a target  range for your lipid levels based on this information. Treatment for dyslipidemia starts with lifestyle changes,  such as diet and exercise. Your health care provider may recommend that you: Get regular exercise. Make changes to your diet. Quit smoking if you smoke. Limit your alcohol intake. If diet changes and exercise do not help you reach your goals, your health care provider may also prescribe medicine to lower lipids. The most commonly prescribed type of medicine lowers your LDL cholesterol (statin drug). If you have a high triglyceride level, your provider may prescribe another type of drug (fibrate) or an omega-3 fish oil supplement, or both. Follow these instructions at home: Eating and drinking  Follow instructions from your health care provider or dietitian about eating or drinking restrictions. Eat a healthy diet as told by your health care provider. This can help you reach and maintain a healthy weight, lower your LDL cholesterol, and raise your HDL cholesterol. This may include: Limiting your calories, if you are overweight. Eating more fruits, vegetables, whole grains, fish, and lean meats. Limiting saturated fat, trans fat, and cholesterol. Do not drink alcohol if: Your health care provider tells you not to drink. You are pregnant, may be pregnant, or are planning to become pregnant. If you drink alcohol: Limit how much you have to: 0-1 drink a day for women. 0-2 drinks a day for men. Know how much alcohol is in your drink. In the [place] one drink equals one 12 oz bottle of beer (355 mL), one 5 oz glass of wine (148 mL), or one 1 oz glass of hard liquor (44 mL). Activity Get regular exercise. Start an exercise and strength training program as told by your health care provider. Ask your health care provider what activities are safe for you. Your health care provider may recommend: 30 minutes of aerobic activity 4-6 days a week. Brisk walking is an example of aerobic activity. Strength training 2 days a week. General instructions Do not use any products that contain nicotine or tobacco.  These products include cigarettes, chewing tobacco, and vaping devices, such as e-cigarettes. If you need help quitting, ask your health care provider. Take over-the-counter and prescription medicines only as told by your health care provider. This includes supplements. Keep all follow-up visits. This is important. Contact a health care provider if: You are having trouble sticking to your exercise or diet plan. You are struggling to quit smoking or to control your use of alcohol. Summary Dyslipidemia often involves a high level of cholesterol or triglycerides, which are types of lipids. Treatment depends on the type of dyslipidemia that you have and your other risk factors for heart disease and stroke. Treatment for dyslipidemia starts with lifestyle changes, such as diet and exercise. Your health care provider may prescribe medicine to lower lipids. This information is not intended to replace advice given to you by your health care provider. Make sure you discuss any questions you have with your health care provider. Document Revised: 08/03/2020 Document Reviewed: 08/03/2020 Elsevier Patient Education  2022 [employer].

## 2021-07-12 NOTE — Assessment & Plan Note (Signed)
Harsh systolic murmur 2/6 Asymptomatic Last echo completed 04/2016:" Left ventricle: The cavity size was normal. Wall thickness was normal. Systolic function was normal. The estimated ejection fraction was in the range of 55% to 60%. Wall motion was normal; there were no regional wall motion abnormalities. Left ventricular diastolic function parameters were normal. Pulmonary arteries: Systolic pressure was mildly to moderately increased. PA peak pressure: 40 mm Hg (S). Normal aortic, mitral and pulmonic valves.trivial tricuspid valve regurgitation"  Repeat echocardiogram if becomes symptomatic.

## 2021-07-12 NOTE — Assessment & Plan Note (Addendum)
Managed by [facility] Current use of levothyroxine and armour thyroid 180mg  per patient

## 2021-07-13 NOTE — Telephone Encounter (Signed)
LVM for patient to return call and confirm dosage.

## 2021-07-13 NOTE — Telephone Encounter (Signed)
Pt confirmed dosage at 40mg  BID.

## 2021-07-16 MED ORDER — LISINOPRIL 40 MG PO TABS: 40.0000 mg | ORAL_TABLET | Freq: Two times a day (BID) | ORAL | 1 refills | Status: DC

## 2021-07-16 NOTE — Addendum Note (Signed)
Addended by: Wilfred Lacy L on: 07/16/2021 11:09 AM   Modules accepted: Orders

## 2021-07-23 ENCOUNTER — Telehealth: Payer: Self-pay

## 2021-07-23 NOTE — Telephone Encounter (Signed)
Called patient regarding medical records release that came back due to no records in last 3 years but patient had seen them before that.   Re-faxing asking for all records to be faxed.   Although when on the phone with her, she stated that she was not pleased with her new patient appointment with Shannon Reyes on 07/12/21.  She reports that she didn't get a warm welcome and felt like it was a burden further to be in the room, didn't smile.  Sincerely apologized for her feeling that way and offered to get her next appointment with one of our other providers in May. She agreeable to that and scheduled her with Dr Shannon Reyes on 10/11/21 @ 10:30 am.   She did state that she did a [place] survey on that visit and did as well put that she was dissatisfied with that one.Dm/cma

## 2021-07-23 NOTE — Telephone Encounter (Signed)
Cancelled appt 5/1 with Shannon Reyes, changed appt 5/1 with Dr. Veto Kemps to [facility] visit

## 2021-07-29 ENCOUNTER — Ambulatory Visit: Payer: BC Managed Care – PPO

## 2021-08-03 ENCOUNTER — Ambulatory Visit: Payer: BC Managed Care – PPO

## 2021-08-25 ENCOUNTER — Ambulatory Visit: Payer: BC Managed Care – PPO | Admitting: Nurse Practitioner

## 2021-09-13 DIAGNOSIS — F411 Generalized anxiety disorder: Secondary | ICD-10-CM | POA: Diagnosis not present

## 2021-09-13 DIAGNOSIS — F4325 Adjustment disorder with mixed disturbance of emotions and conduct: Secondary | ICD-10-CM | POA: Diagnosis not present

## 2021-09-13 DIAGNOSIS — F331 Major depressive disorder, recurrent, moderate: Secondary | ICD-10-CM | POA: Diagnosis not present

## 2021-09-13 DIAGNOSIS — F902 Attention-deficit hyperactivity disorder, combined type: Secondary | ICD-10-CM | POA: Diagnosis not present

## 2021-09-22 ENCOUNTER — Encounter: Payer: BC Managed Care – PPO | Admitting: Family Medicine

## 2021-10-11 ENCOUNTER — Encounter: Payer: BC Managed Care – PPO | Admitting: Family Medicine

## 2021-10-11 ENCOUNTER — Ambulatory Visit: Payer: BC Managed Care – PPO | Admitting: Nurse Practitioner

## 2021-10-27 ENCOUNTER — Ambulatory Visit: Payer: BC Managed Care – PPO | Admitting: Family Medicine

## 2021-10-27 ENCOUNTER — Encounter: Payer: Self-pay | Admitting: Family Medicine

## 2021-10-27 VITALS — BP 168/88 | HR 69 | Temp 97.0°F | Ht 64.0 in | Wt 222.6 lb

## 2021-10-27 DIAGNOSIS — E039 Hypothyroidism, unspecified: Secondary | ICD-10-CM

## 2021-10-27 DIAGNOSIS — Z6838 Body mass index (BMI) 38.0-38.9, adult: Secondary | ICD-10-CM

## 2021-10-27 DIAGNOSIS — F988 Other specified behavioral and emotional disorders with onset usually occurring in childhood and adolescence: Secondary | ICD-10-CM | POA: Insufficient documentation

## 2021-10-27 DIAGNOSIS — F418 Other specified anxiety disorders: Secondary | ICD-10-CM | POA: Diagnosis not present

## 2021-10-27 DIAGNOSIS — I10 Essential (primary) hypertension: Secondary | ICD-10-CM

## 2021-10-27 DIAGNOSIS — Z23 Encounter for immunization: Secondary | ICD-10-CM | POA: Diagnosis not present

## 2021-10-27 MED ORDER — CHLORTHALIDONE 25 MG PO TABS: 25.0000 mg | ORAL_TABLET | Freq: Every day | ORAL | 3 refills | Status: DC

## 2021-10-27 NOTE — Progress Notes (Signed)
?[facility] ?[facility] ?[address] ?[place] [id number] ?Dept: [phone] ?Dept Fax: [phone] ? ?Transfer of Care Office Visit ? ?Subjective:  ? ? Patient ID: Shannon Reyes, female    DOB: 10/28/1950, 71 y.o..   MRN: [id number] ? ?Chief Complaint  ?Patient presents with  ? Establish Care  ?  TOC - establish care. No concerns.    ? ? ?History of Present Illness: ? ?Patient is in today to establish care. Ms. Shannon Re[place] was [place], [place] where she lived for 35 years. She attended 1 year of college at [employer] in general studies. After divorcing her first husband, she moved to [place], [place]. She has been married ot her 2nd husband for 31 years. They moved here related to work. Ms. Shannon Reyes has 2 children; a son ([id number]) and a daughter [phone]). She also has three grandsons. She has three step daughters and one step grandchild. She worked for years for [employer]. 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 She denies tobacco or drug use. She drinks about 2-3 glasses of wine a week and a rare vodka cocktail. ? ?Ms. Shannon Reyes has a history of essential hypertension. She is currently managed on HCTZ 25 mg daily, lisinopril 40 mg daily, and metoprolol, 25 mg daily. She notes her blood pressures have been high at times at home. ? ?Ms. Shannon Reyes has a history of anxiety with depression. She has been on bupropion XL 300 mg daily for a number of years. She feels this adequately manages her symptoms. She does admit to increased work stressors and had some issues with stress surrounding the death of her parents. Overall she feels she has learned to manage with the generalized anxiety. She follows with Dr. Arta BruceLavoi (psychiatry) for management. Additionally, she has ADD and is managed on amphetamine Stann Mainland(Evekeo). This apparently has worked quite well for her. ? ?Ms. Shannon Reyes has a history of hypothyroidism. She is seen at [facility] for this. She is on levothyroxine 50 mcg  daily and Armour Thyroid (thyroid extract) 180 mg daily. She notes this has worked quite well for her. ? ?Ms. Shannon Reyes is being managed an estradiol patch, progesterone 200 mg, and testosterone 2% gel as part of a package of postmenopausal hormones. [facility]. ? ?Ms. Shannon Reyes had a past partial colectomy due to recurrent diverticulitis. She is managed on a daily probiotic. ? ?Past Medical History: ?Patient Active Problem List  ? Diagnosis Date Noted  ? Anxiety with depression 10/27/2021  ? ADD (attention deficit disorder) 10/27/2021  ? Hypothyroidism 07/12/2021  ? S/P hysterectomy with oophorectomy 07/12/2021  ? Diverticulitis of sigmoid colon s/p lap colectomy 06/19/2018 06/23/2018  ? S/P partial colectomy 06/19/2018  ? Degeneration of lumbar intervertebral disc 03/30/2018  ? Perennial allergic rhinitis 03/15/2016  ? Sensorineural hearing loss (SNHL), bilateral 03/15/2016  ? Tinnitus, bilateral 03/15/2016  ? GERD (gastroesophageal reflux disease) 01/25/2013  ? Essential hypertension 12/31/2012  ? Systolic murmur 12/31/2012  ? Coronary artery fistula 12/31/2012  ? Class 2 obesity due to excess calories with body mass index (BMI) of 38.0 to 38.9 in adult 12/06/2012  ? ?Past Surgical History:  ?Procedure Laterality Date  ? ABDOMINAL HYSTERECTOMY  1981  ? COMPLETE  ? ANKLE SURGERY Right 2004/2014  ? BLEPHAROPLASTY Left   ? CATARACT EXTRACTION Bilateral   ? LEFT OCT 2018, RIGHT NOV 2019  ? CESAREAN SECTION  1977 AND 1980  ? COLON RESECTION Left 06/19/2018  ?  Procedure: LAPAROSCOPIC ASSISTED  LEFT COLON RESECTION;  Surgeon: Luretha Murphy, MD;  Location: [facility];  Service: General;  Laterality: Left;  ERAS PATHWAY  ? CYSTOSCOPY WITH STENT PLACEMENT Bilateral 06/19/2018  ? Procedure: CYSTOSCOPY WITH BILATERAL URETERAL CATHETER PLACEMENT;  Surgeon: Bjorn Pippin, MD;  Location: [facility];  Service: Urology;  Laterality: Bilateral;  ? OVARIAN CYST REMOVAL    ? ?Family History  ?Problem Relation Age of Onset  ? Heart disease  Mother   ? Stroke Father   ? Hypertension Father   ? Heart disease Father   ? Diabetes Paternal Uncle   ? Parkinson's disease Maternal Grandmother   ? Cancer Paternal Grandmother   ?     Pancreatic  ? Diabetes Paternal Grandfather   ? Breast cancer Neg Hx   ? ?Outpatient Medications Prior to Visit  ?Medication Sig Dispense Refill  ? buPROPion (WELLBUTRIN XL) 300 MG 24 hr tablet Take 300 mg by mouth daily.    ? estradiol (VIVELLE-DOT) 0.075 MG/24HR estradiol 0.075 mg/24 hr semiweekly transdermal patch    ? EVEKEO 10 MG TABS Take 10 mg by mouth daily as needed (ADHD).   0  ? levothyroxine (SYNTHROID) 50 MCG tablet Take 50 mcg by mouth daily before breakfast.    ? lisinopril (ZESTRIL) 40 MG tablet Take 1 tablet (40 mg total) by mouth 2 (two) times daily. 180 tablet 1  ? MAGNESIUM PO Take 3 capsules by mouth at bedtime.    ? progesterone (PROMETRIUM) 200 MG capsule Take by mouth.    ? Testosterone 10 MG/ACT (2%) GEL Apply 1 application topically daily.     ? thyroid (ARMOUR) 180 MG tablet Take 180 mg by mouth daily.    ? hydrochlorothiazide (HYDRODIURIL) 25 MG tablet Take 1 tablet (25 mg total) by mouth daily. 90 tablet 1  ? metoprolol tartrate (LOPRESSOR) 25 MG tablet Take 1 tablet (25 mg total) by mouth daily at 12 noon. 90 tablet 1  ? progesterone (PROMETRIUM) 100 MG capsule Take 300 mg by mouth every evening.   2  ? saccharomyces boulardii (FLORASTOR) 250 MG capsule Take 1 capsule (250 mg total) by mouth 2 (two) times daily. 30 capsule 0  ? traZODone (DESYREL) 50 MG tablet Take 50 mg by mouth at bedtime.    ? erythromycin ophthalmic ointment erythromycin 5 mg/gram (0.5 %) eye ointment    ? methocarbamol (ROBAXIN) 500 MG tablet Take 500 mg by mouth 2 (two) times daily as needed for muscle spasms.   0  ? Multiple Vitamins-Minerals (ZINC PO) Take 1 capsule by mouth daily.    ? naproxen (NAPROSYN) 500 MG tablet Take 1 tablet (500 mg total) by mouth 2 (two) times daily with a meal. 60 tablet 1  ? ?No  facility-administered medications prior to visit.  ? ?Allergies  ?Allergen Reactions  ? Erythromycin Base Itching  ? Codeine Itching  ?  ?Objective:  ? ?Today's Vitals  ? 10/27/21 1308 10/27/21 1311  ?BP: (!) 164/86 (!) 168/88  ?Pulse: 69   ?Temp: (!) 97 ?F (36.1 ?C)   ?TempSrc: Temporal   ?SpO2: 98%   ?Weight: 222 lb 9.6 oz (101 kg)   ?Height: 5\' 4"  (1.626 m)   ? ?Body mass index is 38.21 kg/m?.  ? ?General: Well developed, well nourished. No acute distress. ?Psych: Alert and oriented. Normal mood and affect. ? ?Health Maintenance Due  ?Topic Date Due  ? Hepatitis C Screening  Never done  ? Zoster Vaccines- Shingrix (1 of 2) Never done  ?  MAMMOGRAM  03/10/2019  ? COVID-19 Vaccine (2 - Moderna series) 09/21/2019  ?   ?Assessment & Plan:  ? ?1. Essential hypertension ?Ms. Faso's blood pressure is elevated. I recommend we make some changes in her regimen. I will have her stop the metoprolol, as I don't think it is adding much. I will switch her from HCTZ to chlorthalidone, as she is concerned about swelling. I also recommended either a DASH diet or Mediterranean diet for CV health. I will see her back in 1 month and plan to check a BMP. If blood pressure still not at goal, we would consider either a MRA or a CCB for further control. ? ?- chlorthalidone (HYGROTON) 25 MG tablet; Take 1 tablet (25 mg total) by mouth daily.  Dispense: 90 tablet; Refill: 3 ? ?2. Hypothyroidism, unspecified type ?Apparently well managed on Synthroid and Armour Thyroid. I will leave it to [facility] to manage this. ? ?3. Class 2 severe obesity due to excess calories with serious comorbidity and body mass index (BMI) of 38.0 to 38.9 in adult [facility] ?Discussed importance of regular exercise and dietary changes to reduce weight and lower blood pressure. ? ?4. Anxiety with depression ?Continue bupropion under care of Dr. Seymour Bars. ? ?5. Need for pneumococcal vaccination ? ?- Pneumococcal conjugate vaccine 13-valent IM ? ?6. Need for Td vaccine ? ?- Td :  Tetanus/diphtheria >7yo Preservative  free ? ?Return in about 4 weeks (around 11/24/2021) for Reassessment.  ? ?Loyola Mast, MD ?

## 2021-11-03 DIAGNOSIS — F331 Major depressive disorder, recurrent, moderate: Secondary | ICD-10-CM | POA: Diagnosis not present

## 2021-11-03 DIAGNOSIS — F902 Attention-deficit hyperactivity disorder, combined type: Secondary | ICD-10-CM | POA: Diagnosis not present

## 2021-11-03 DIAGNOSIS — F411 Generalized anxiety disorder: Secondary | ICD-10-CM | POA: Diagnosis not present

## 2021-11-03 DIAGNOSIS — F4325 Adjustment disorder with mixed disturbance of emotions and conduct: Secondary | ICD-10-CM | POA: Diagnosis not present

## 2021-11-24 ENCOUNTER — Ambulatory Visit: Payer: BC Managed Care – PPO | Admitting: Family Medicine

## 2021-11-24 VITALS — BP 124/70 | HR 84 | Temp 97.3°F | Ht 64.0 in | Wt 217.6 lb

## 2021-11-24 DIAGNOSIS — I10 Essential (primary) hypertension: Secondary | ICD-10-CM | POA: Diagnosis not present

## 2021-11-24 DIAGNOSIS — E6609 Other obesity due to excess calories: Secondary | ICD-10-CM

## 2021-11-24 DIAGNOSIS — E039 Hypothyroidism, unspecified: Secondary | ICD-10-CM

## 2021-11-24 DIAGNOSIS — Z6837 Body mass index (BMI) 37.0-37.9, adult: Secondary | ICD-10-CM | POA: Diagnosis not present

## 2021-11-24 MED ORDER — THYROID 120 MG PO TABS: 120.0000 mg | ORAL_TABLET | Freq: Every day | ORAL | 3 refills | Status: DC

## 2021-11-24 MED ORDER — THYROID 30 MG PO TABS: 30.0000 mg | ORAL_TABLET | Freq: Every day | ORAL | 3 refills | Status: DC

## 2021-11-24 NOTE — Progress Notes (Signed)
[facility] [address] [place] [address] Dept: [phone] Dept Fax: [phone]  Office Visit  Subjective:    Patient ID: Shannon Reyes, female    DOB: 1951-03-04, 71 y.o..   MRN: [id number]  Chief Complaint  Patient presents with   Follow-up    4 week f/u.  Average BP at home 104/86 - 131/84.  Down 5 lbs.      History of Present Illness:  Patient is in today for reassessment of her blood pressure. Shannon Reyes has a history of essential hypertension. She was being managed on HCTZ 25 mg daily, lisinopril 40 mg daily, and metoprolol 25 mg daily. At her last visit, her systolic BP was in the 160s. I had her stop the metoprolol and switched her HCTZ to chlorthalidone. She has seen her blood pressures at home are much improved on this regimen. She did have some lab work through work recently and was noted to have a mildly low sodium and potassium.   Shannon Reyes has a history of hypothyroidism. She has been being managed at [facility] for this. She is on levothyroxine 50 mcg daily and Armour Thyroid (thyroid extract) 180 mg daily. She notes that her TSH has been low at times. Her doctor tried lowering her Armour Thyroid to 120 mg and then her symptoms significantly flared. She feels like she has higher requirements for T3 than others might. She would like to have me manage her thyroid issue moving forward.   Shannon Reyes has also been being managed an estradiol patch, progesterone 200 mg, and testosterone 2% gel as part of a package of postmenopausal hormones through [facility]. She has stopped the testosterone and is considering stopping the progesterone.  Shannon Reyes is trying to walk more and has made some dietary changes. She was pleased ot see her weight is down 5 lbs.  Past Medical History: Patient Active Problem List   Diagnosis Date Noted   Anxiety with depression 10/27/2021   ADD (attention deficit disorder) 10/27/2021    Hypothyroidism 07/12/2021   S/P hysterectomy with oophorectomy 07/12/2021   Diverticulitis of sigmoid colon s/p lap colectomy 06/19/2018 06/23/2018   S/P partial colectomy 06/19/2018   Degeneration of lumbar intervertebral disc 03/30/2018   Perennial allergic rhinitis 03/15/2016   Sensorineural hearing loss (SNHL), bilateral 03/15/2016   Tinnitus, bilateral 03/15/2016   GERD (gastroesophageal reflux disease) 01/25/2013   Essential hypertension 12/31/2012   Systolic murmur 12/31/2012   Coronary artery fistula 12/31/2012   Class 2 obesity due to excess calories with body mass index (BMI) of 38.0 to 38.9 in adult 12/06/2012   Past Surgical History:  Procedure Laterality Date   ABDOMINAL HYSTERECTOMY  1981   COMPLETE   ANKLE SURGERY Right 2004/2014   BLEPHAROPLASTY Left    CATARACT EXTRACTION Bilateral    LEFT OCT 2018, RIGHT NOV 2019   CESAREAN SECTION  1977 AND 1980   COLON RESECTION Left 06/19/2018   Procedure: LAPAROSCOPIC ASSISTED  LEFT COLON RESECTION;  Surgeon: Luretha Murphy, MD;  Location: [facility];  Service: General;  Laterality: Left;  ERAS PATHWAY   CYSTOSCOPY WITH STENT PLACEMENT Bilateral 06/19/2018   Procedure: CYSTOSCOPY WITH BILATERAL URETERAL CATHETER PLACEMENT;  Surgeon: Bjorn Pippin, MD;  Location: [facility];  Service: Urology;  Laterality: Bilateral;   OVARIAN CYST REMOVAL     Family History  Problem Relation Age of Onset   Heart disease Mother    Stroke Father    Hypertension Father  Heart disease Father    Diabetes Paternal Uncle    Parkinson's disease Maternal Grandmother    Cancer Paternal Grandmother        Pancreatic   Diabetes Paternal Grandfather    Breast cancer Neg Hx    Outpatient Medications Prior to Visit  Medication Sig Dispense Refill   buPROPion (WELLBUTRIN XL) 300 MG 24 hr tablet Take 300 mg by mouth daily.     chlorthalidone (HYGROTON) 25 MG tablet Take 1 tablet (25 mg total) by mouth daily. 90 tablet 3   estradiol (VIVELLE-DOT) 0.075  MG/24HR estradiol 0.075 mg/24 hr semiweekly transdermal patch     EVEKEO 10 MG TABS Take 10 mg by mouth daily as needed (ADHD).   0   levothyroxine (SYNTHROID) 50 MCG tablet Take 50 mcg by mouth daily before breakfast.     lisinopril (ZESTRIL) 40 MG tablet Take 1 tablet (40 mg total) by mouth 2 (two) times daily. 180 tablet 1   MAGNESIUM PO Take 3 capsules by mouth at bedtime.     progesterone (PROMETRIUM) 200 MG capsule Take by mouth.     traZODone (DESYREL) 50 MG tablet Take 50 mg by mouth at bedtime.     thyroid (ARMOUR) 180 MG tablet Take 180 mg by mouth daily.     saccharomyces boulardii (FLORASTOR) 250 MG capsule Take 1 capsule (250 mg total) by mouth 2 (two) times daily. 30 capsule 0   Testosterone 10 MG/ACT (2%) GEL Apply 1 application topically daily.      No facility-administered medications prior to visit.   Allergies  Allergen Reactions   Erythromycin Base Itching   Codeine Itching    Objective:   Today's Vitals   11/24/21 1259  BP: 124/70  Pulse: 84  Temp: (!) 97.3 F (36.3 C)  TempSrc: Temporal  SpO2: 98%  Weight: 217 lb 9.6 oz (98.7 kg)  Height: 5\' 4"  (1.626 m)   Body mass index is 37.35 kg/m.   General: Well developed, well nourished. No acute distress. Psych: Alert and oriented. Normal mood and affect.  Health Maintenance Due  Topic Date Due   Hepatitis C Screening  Never done   Zoster Vaccines- Shingrix (1 of 2) Never done   MAMMOGRAM  03/10/2019   Lab Results (11/12/2021)  Lipid Panel  Total Cholesterol: 187 mg/dL  Triglycerides:  01/12/2022 mg/dL  HDL Cholesterol: 51 mg/dL  LDL Cholesterol: 681 mg/dl  TSH: 275 mU/L T4: 7.3 T3 Uptake: 26 FTI: 1.9  Comprehensive Metabolic Panel  Sodium: 132 mEq/L  Potassium: 3.3 mEq/L  Chloride: 94 mEq/L  Glucose: 98 mg/dL  BUN:  18 mg/dL  Creatinine: 1.700 mg/dL  Complete Blood Count:  WBC:  7.4 K/uL  RBC:  4.46 M/mm3  Hemoglobin: 14.1 gm/dL  Hematocrit: 1.74 %  MCV:  95 fL  MCH:  31.6 pg  MCHC:  33.2  g/dL  RDW:  94.4 fL  Platelets: 233 K/cumm    Assessment & Plan:   1. Essential hypertension Blood pressure is much improved. In light of the recent hyponatremia/hypokalemia. I will repeat her BMP today. She may need potassium supplementation. We will continue her on lisinopril 40 mg daily and chlorthalidone 25 mg daily for now.  - Basic metabolic panel  2. Hypothyroidism, unspecified type We discussed the risks associated with excessive replacement of thyroid hormones. I recommend we try to step her Armour Thyroid down a smaller amount than was tried previously. We will move to 150 mg a day vs. her current 180 mg. We  will reassess her TSH in 3 months.  - thyroid (ARMOUR THYROID) 30 MG tablet; Take 1 tablet (30 mg total) by mouth daily before breakfast.  Dispense: 30 tablet; Refill: 3 - thyroid (ARMOUR THYROID) 120 MG tablet; Take 1 tablet (120 mg total) by mouth daily before breakfast.  Dispense: 30 tablet; Refill: 3  3. Class 2 obesity due to excess calories without serious comorbidity with body mass index (BMI) of 37.0 to 37.9 in adult Encouraged her to continue her efforts at weight loss.  Return in about 3 months (around 02/24/2022) for Reassessment.   Loyola MastStephen M Patience Nuzzo, MD

## 2021-11-25 LAB — BASIC METABOLIC PANEL
BUN: 28 mg/dL — ABNORMAL HIGH (ref 6–23)
CO2: 28 mEq/L (ref 19–32)
Calcium: 9.8 mg/dL (ref 8.4–10.5)
Chloride: 100 mEq/L (ref 96–112)
Creatinine, Ser: 0.76 mg/dL (ref 0.40–1.20)
GFR: 79.9 mL/min (ref >60.00)
Glucose, Bld: 90 mg/dL (ref 70–99)
Potassium: 3.8 mEq/L (ref 3.5–5.1)
Sodium: 138 mEq/L (ref 135–145)

## 2021-11-29 ENCOUNTER — Ambulatory Visit: Payer: BC Managed Care – PPO | Admitting: Family Medicine

## 2021-12-16 DIAGNOSIS — M9903 Segmental and somatic dysfunction of lumbar region: Secondary | ICD-10-CM | POA: Diagnosis not present

## 2021-12-16 DIAGNOSIS — M5386 Other specified dorsopathies, lumbar region: Secondary | ICD-10-CM | POA: Diagnosis not present

## 2021-12-16 DIAGNOSIS — M9901 Segmental and somatic dysfunction of cervical region: Secondary | ICD-10-CM | POA: Diagnosis not present

## 2021-12-16 DIAGNOSIS — M531 Cervicobrachial syndrome: Secondary | ICD-10-CM | POA: Diagnosis not present

## 2021-12-17 DIAGNOSIS — M9901 Segmental and somatic dysfunction of cervical region: Secondary | ICD-10-CM | POA: Diagnosis not present

## 2021-12-17 DIAGNOSIS — M9903 Segmental and somatic dysfunction of lumbar region: Secondary | ICD-10-CM | POA: Diagnosis not present

## 2021-12-17 DIAGNOSIS — M5386 Other specified dorsopathies, lumbar region: Secondary | ICD-10-CM | POA: Diagnosis not present

## 2021-12-17 DIAGNOSIS — M531 Cervicobrachial syndrome: Secondary | ICD-10-CM | POA: Diagnosis not present

## 2021-12-28 DIAGNOSIS — M9901 Segmental and somatic dysfunction of cervical region: Secondary | ICD-10-CM | POA: Diagnosis not present

## 2021-12-28 DIAGNOSIS — M9903 Segmental and somatic dysfunction of lumbar region: Secondary | ICD-10-CM | POA: Diagnosis not present

## 2021-12-28 DIAGNOSIS — M5386 Other specified dorsopathies, lumbar region: Secondary | ICD-10-CM | POA: Diagnosis not present

## 2021-12-28 DIAGNOSIS — M531 Cervicobrachial syndrome: Secondary | ICD-10-CM | POA: Diagnosis not present

## 2022-01-04 DIAGNOSIS — M9903 Segmental and somatic dysfunction of lumbar region: Secondary | ICD-10-CM | POA: Diagnosis not present

## 2022-01-04 DIAGNOSIS — M5386 Other specified dorsopathies, lumbar region: Secondary | ICD-10-CM | POA: Diagnosis not present

## 2022-01-04 DIAGNOSIS — M531 Cervicobrachial syndrome: Secondary | ICD-10-CM | POA: Diagnosis not present

## 2022-01-04 DIAGNOSIS — M9901 Segmental and somatic dysfunction of cervical region: Secondary | ICD-10-CM | POA: Diagnosis not present

## 2022-01-06 DIAGNOSIS — M9901 Segmental and somatic dysfunction of cervical region: Secondary | ICD-10-CM | POA: Diagnosis not present

## 2022-01-06 DIAGNOSIS — M9903 Segmental and somatic dysfunction of lumbar region: Secondary | ICD-10-CM | POA: Diagnosis not present

## 2022-01-06 DIAGNOSIS — M531 Cervicobrachial syndrome: Secondary | ICD-10-CM | POA: Diagnosis not present

## 2022-01-06 DIAGNOSIS — M5386 Other specified dorsopathies, lumbar region: Secondary | ICD-10-CM | POA: Diagnosis not present

## 2022-01-12 DIAGNOSIS — M5386 Other specified dorsopathies, lumbar region: Secondary | ICD-10-CM | POA: Diagnosis not present

## 2022-01-12 DIAGNOSIS — M9903 Segmental and somatic dysfunction of lumbar region: Secondary | ICD-10-CM | POA: Diagnosis not present

## 2022-01-12 DIAGNOSIS — M531 Cervicobrachial syndrome: Secondary | ICD-10-CM | POA: Diagnosis not present

## 2022-01-12 DIAGNOSIS — M9901 Segmental and somatic dysfunction of cervical region: Secondary | ICD-10-CM | POA: Diagnosis not present

## 2022-01-13 DIAGNOSIS — M5386 Other specified dorsopathies, lumbar region: Secondary | ICD-10-CM | POA: Diagnosis not present

## 2022-01-13 DIAGNOSIS — M9901 Segmental and somatic dysfunction of cervical region: Secondary | ICD-10-CM | POA: Diagnosis not present

## 2022-01-13 DIAGNOSIS — M531 Cervicobrachial syndrome: Secondary | ICD-10-CM | POA: Diagnosis not present

## 2022-01-13 DIAGNOSIS — M9903 Segmental and somatic dysfunction of lumbar region: Secondary | ICD-10-CM | POA: Diagnosis not present

## 2022-01-18 DIAGNOSIS — M9903 Segmental and somatic dysfunction of lumbar region: Secondary | ICD-10-CM | POA: Diagnosis not present

## 2022-01-18 DIAGNOSIS — M531 Cervicobrachial syndrome: Secondary | ICD-10-CM | POA: Diagnosis not present

## 2022-01-18 DIAGNOSIS — M9901 Segmental and somatic dysfunction of cervical region: Secondary | ICD-10-CM | POA: Diagnosis not present

## 2022-01-18 DIAGNOSIS — M5386 Other specified dorsopathies, lumbar region: Secondary | ICD-10-CM | POA: Diagnosis not present

## 2022-01-21 DIAGNOSIS — M9901 Segmental and somatic dysfunction of cervical region: Secondary | ICD-10-CM | POA: Diagnosis not present

## 2022-01-21 DIAGNOSIS — M5386 Other specified dorsopathies, lumbar region: Secondary | ICD-10-CM | POA: Diagnosis not present

## 2022-01-21 DIAGNOSIS — M531 Cervicobrachial syndrome: Secondary | ICD-10-CM | POA: Diagnosis not present

## 2022-01-21 DIAGNOSIS — M9903 Segmental and somatic dysfunction of lumbar region: Secondary | ICD-10-CM | POA: Diagnosis not present

## 2022-01-27 DIAGNOSIS — M5386 Other specified dorsopathies, lumbar region: Secondary | ICD-10-CM | POA: Diagnosis not present

## 2022-01-27 DIAGNOSIS — M9901 Segmental and somatic dysfunction of cervical region: Secondary | ICD-10-CM | POA: Diagnosis not present

## 2022-01-27 DIAGNOSIS — M531 Cervicobrachial syndrome: Secondary | ICD-10-CM | POA: Diagnosis not present

## 2022-01-27 DIAGNOSIS — M9903 Segmental and somatic dysfunction of lumbar region: Secondary | ICD-10-CM | POA: Diagnosis not present

## 2022-02-01 DIAGNOSIS — M531 Cervicobrachial syndrome: Secondary | ICD-10-CM | POA: Diagnosis not present

## 2022-02-01 DIAGNOSIS — M5386 Other specified dorsopathies, lumbar region: Secondary | ICD-10-CM | POA: Diagnosis not present

## 2022-02-01 DIAGNOSIS — M9901 Segmental and somatic dysfunction of cervical region: Secondary | ICD-10-CM | POA: Diagnosis not present

## 2022-02-01 DIAGNOSIS — M9903 Segmental and somatic dysfunction of lumbar region: Secondary | ICD-10-CM | POA: Diagnosis not present

## 2022-02-04 DIAGNOSIS — M5386 Other specified dorsopathies, lumbar region: Secondary | ICD-10-CM | POA: Diagnosis not present

## 2022-02-04 DIAGNOSIS — M9903 Segmental and somatic dysfunction of lumbar region: Secondary | ICD-10-CM | POA: Diagnosis not present

## 2022-02-04 DIAGNOSIS — M531 Cervicobrachial syndrome: Secondary | ICD-10-CM | POA: Diagnosis not present

## 2022-02-04 DIAGNOSIS — M9901 Segmental and somatic dysfunction of cervical region: Secondary | ICD-10-CM | POA: Diagnosis not present

## 2022-02-07 DIAGNOSIS — F902 Attention-deficit hyperactivity disorder, combined type: Secondary | ICD-10-CM | POA: Diagnosis not present

## 2022-02-07 DIAGNOSIS — F331 Major depressive disorder, recurrent, moderate: Secondary | ICD-10-CM | POA: Diagnosis not present

## 2022-02-07 DIAGNOSIS — F411 Generalized anxiety disorder: Secondary | ICD-10-CM | POA: Diagnosis not present

## 2022-02-07 DIAGNOSIS — F4325 Adjustment disorder with mixed disturbance of emotions and conduct: Secondary | ICD-10-CM | POA: Diagnosis not present

## 2022-02-16 ENCOUNTER — Telehealth: Payer: Self-pay | Admitting: Gastroenterology

## 2022-02-16 NOTE — Telephone Encounter (Signed)
Good Afternoon Dr Adela Lank,  We have received a request from patient to establish her ongoing GI care with you due to her previous Dr Matthias Hughs retiring.  Patient was [facility] seen with [facility]. Patients last procedure was in 2014.  I am sending records up for review. Please review and advise on scheduling.  Thank you

## 2022-02-21 ENCOUNTER — Encounter: Payer: Self-pay | Admitting: Gastroenterology

## 2022-02-22 DIAGNOSIS — Z1231 Encounter for screening mammogram for malignant neoplasm of breast: Secondary | ICD-10-CM | POA: Diagnosis not present

## 2022-02-22 LAB — HM MAMMOGRAPHY

## 2022-02-25 ENCOUNTER — Encounter: Payer: Self-pay | Admitting: Family Medicine

## 2022-02-27 ENCOUNTER — Other Ambulatory Visit: Payer: Self-pay | Admitting: Family Medicine

## 2022-02-27 DIAGNOSIS — E039 Hypothyroidism, unspecified: Secondary | ICD-10-CM

## 2022-03-04 ENCOUNTER — Encounter: Payer: Self-pay | Admitting: Gastroenterology

## 2022-03-21 ENCOUNTER — Other Ambulatory Visit: Payer: Self-pay

## 2022-03-21 DIAGNOSIS — F331 Major depressive disorder, recurrent, moderate: Secondary | ICD-10-CM | POA: Diagnosis not present

## 2022-03-21 DIAGNOSIS — F902 Attention-deficit hyperactivity disorder, combined type: Secondary | ICD-10-CM | POA: Diagnosis not present

## 2022-03-21 DIAGNOSIS — F411 Generalized anxiety disorder: Secondary | ICD-10-CM | POA: Diagnosis not present

## 2022-03-21 DIAGNOSIS — F4325 Adjustment disorder with mixed disturbance of emotions and conduct: Secondary | ICD-10-CM | POA: Diagnosis not present

## 2022-03-21 MED ORDER — ESTRADIOL 0.075 MG/24HR TD PTTW: MEDICATED_PATCH | TRANSDERMAL | 3 refills | Status: DC

## 2022-03-21 NOTE — Telephone Encounter (Signed)
Refill request for  Vivelle-Dot 0.075 patch LR  hx provider LOV 11/24/21 FOV none scheduled.    Please review and advise.  Thanks.  Dm/cma

## 2022-04-05 ENCOUNTER — Other Ambulatory Visit: Payer: Self-pay | Admitting: Family Medicine

## 2022-04-05 DIAGNOSIS — E039 Hypothyroidism, unspecified: Secondary | ICD-10-CM

## 2022-04-21 ENCOUNTER — Ambulatory Visit: Payer: BC Managed Care – PPO | Admitting: Gastroenterology

## 2022-04-22 ENCOUNTER — Encounter: Payer: Self-pay | Admitting: Physician Assistant

## 2022-04-22 ENCOUNTER — Ambulatory Visit: Payer: BC Managed Care – PPO | Admitting: Physician Assistant

## 2022-04-22 VITALS — BP 122/82 | HR 92 | Ht 63.0 in | Wt 221.0 lb

## 2022-04-22 DIAGNOSIS — K219 Gastro-esophageal reflux disease without esophagitis: Secondary | ICD-10-CM

## 2022-04-22 DIAGNOSIS — R197 Diarrhea, unspecified: Secondary | ICD-10-CM | POA: Diagnosis not present

## 2022-04-22 DIAGNOSIS — Z90710 Acquired absence of both cervix and uterus: Secondary | ICD-10-CM

## 2022-04-22 DIAGNOSIS — Z9049 Acquired absence of other specified parts of digestive tract: Secondary | ICD-10-CM

## 2022-04-22 DIAGNOSIS — Z90721 Acquired absence of ovaries, unilateral: Secondary | ICD-10-CM

## 2022-04-22 DIAGNOSIS — R1084 Generalized abdominal pain: Secondary | ICD-10-CM | POA: Diagnosis not present

## 2022-04-22 MED ORDER — OMEPRAZOLE 20 MG PO CPDR: 20.0000 mg | DELAYED_RELEASE_CAPSULE | Freq: Every day | ORAL | 5 refills | Status: DC

## 2022-04-22 MED ORDER — DICYCLOMINE HCL 10 MG PO CAPS: 10.0000 mg | ORAL_CAPSULE | Freq: Four times a day (QID) | ORAL | 0 refills | Status: DC

## 2022-04-22 NOTE — Progress Notes (Signed)
Agree with assessment and plan as outlined.  

## 2022-04-22 NOTE — Progress Notes (Addendum)
Chief Complaint: Abdominal pain, diarrhea and GERD  HPI:    [name] is a 71 year old female, requested Dr. Havery Moros, with a past medical history of GERD and others listed below, who was referred to me by Haydee Salter, MD for a complaint of abdominal pain, diarrhea and GERD.      05/03/2018 CT of the abdomen pelvis with contrast for abdominal pain and nausea with acute diverticulitis of the splenic flexure, improved diverticulitis of the sigmoid colon, urinary tract unremarkable, aortic atherosclerosis, hysterectomy and small fat-containing periumbilical hernia.    Today, the patient tells me that she had a partial colectomy in 2020 due to her recurrent diverticulitis.  She really had been doing well since then but in June of this year she started just "not feeling good" in her gut.  Tells me that she knows she has gluten issues and dairy issues so she really tries to avoid these foods but regardless of doing this would have bouts of diarrhea that would often wake her up in the middle of the night and sometimes are so urgent that she could not get out of bed in time.  This seemed to calm down for a time but then in August she started with a generalized abdominal pain mostly in her lower abdomen which was unhelpful to Ibuprofen or Tylenol.  Tells me that now this pain has increased in frequency and is "on more than off".  Rated as a 7-9/10. Describes that she sometimes has pain in her left lower quadrant that seems to come and go but this also radiates over to her right side and she just has "hot spots" everywhere.  Feels like she does not want anyone to touch her abdomen because it is so uncomfortable.  Tells me that her bowel movements were normal and she had no episodes of diarrhea over the past 4 weeks but just last night it woke her up in the middle of the night with loose stools.    Along with the above feels like whenever she eats there is a burning midway through her esophagus.  Previously  treated with Omeprazole for GERD, but discontinued this medicine on her own a couple years ago after reading about side effects and had really not had any trouble until recently.    Does admit to drinking some vodka and tonic not every night but she has a high stress job and probably has 2-3 drinks a week.  Also on Magnesium supplement 3 capsules at night which she has been on for the past 3 years after recommendations from her holistic clinic in town.    Denies fever, chills, weight loss, blood in her stool, nausea or vomiting.  Past Medical History:  Diagnosis Date   Diverticulitis    Diverticulosis    GERD (gastroesophageal reflux disease) YRS AGO   Hypertension    Obesity    Thyroid disease     Past Surgical History:  Procedure Laterality Date   ABDOMINAL HYSTERECTOMY  1981   COMPLETE   ANKLE SURGERY Right 2004/2014   BLEPHAROPLASTY Left    CATARACT EXTRACTION Bilateral    LEFT OCT 2018, RIGHT NOV 2019   CESAREAN SECTION  1977 AND 1980   COLON RESECTION Left 06/19/2018   Procedure: LAPAROSCOPIC ASSISTED  LEFT COLON RESECTION;  Surgeon: Johnathan Hausen, MD;  Location: [facility];  Service: General;  Laterality: Left;  ERAS PATHWAY   CYSTOSCOPY WITH STENT PLACEMENT Bilateral 06/19/2018   Procedure: CYSTOSCOPY WITH BILATERAL URETERAL CATHETER PLACEMENT;  Surgeon: Bjorn Pippin, MD;  Location: [facility];  Service: Urology;  Laterality: Bilateral;   OVARIAN CYST REMOVAL      Current Outpatient Medications  Medication Sig Dispense Refill   ARMOUR THYROID 120 MG tablet TAKE 1 TABLET BY MOUTH DAILY BEFORE BREAKFAST 60 tablet 0   buPROPion (WELLBUTRIN XL) 300 MG 24 hr tablet Take 300 mg by mouth daily.     chlorthalidone (HYGROTON) 25 MG tablet Take 1 tablet (25 mg total) by mouth daily. 90 tablet 3   estradiol (VIVELLE-DOT) 0.075 MG/24HR estradiol 0.075 mg/24 hr semiweekly transdermal patch 8 patch 3   EVEKEO 10 MG TABS Take 10 mg by mouth daily as needed (ADHD).   0   levothyroxine  (SYNTHROID) 50 MCG tablet Take 50 mcg by mouth daily before breakfast.     lisinopril (ZESTRIL) 40 MG tablet Take 1 tablet (40 mg total) by mouth 2 (two) times daily. 180 tablet 1   MAGNESIUM PO Take 3 capsules by mouth at bedtime.     progesterone (PROMETRIUM) 200 MG capsule Take by mouth.     thyroid (ARMOUR THYROID) 30 MG tablet Take 1 tablet (30 mg total) by mouth daily before breakfast. Needs appointment before next refill. 30 tablet 0   traZODone (DESYREL) 50 MG tablet Take 50 mg by mouth at bedtime.     No current facility-administered medications for this visit.    Allergies as of 04/22/2022 - Review Complete 04/22/2022  Allergen Reaction Noted   Erythromycin base Itching 07/12/2021   Codeine Itching 12/31/2012    Family History  Problem Relation Age of Onset   Heart disease Mother    Stroke Father    Hypertension Father    Heart disease Father    Diabetes Paternal Uncle    Parkinson's disease Maternal Grandmother    Cancer Paternal Grandmother        Pancreatic   Diabetes Paternal Grandfather    Breast cancer Neg Hx     Social History   Socioeconomic History   Marital status: Married    Spouse name: Not on file   Number of children: 2   Years of education: Not on file   Highest education level: Some college, no degree  Occupational History   Occupation: IT change management    Comment: [employer]  Tobacco Use   Smoking status: Never   Smokeless tobacco: Never  Vaping Use   Vaping Use: Never used  Substance and Sexual Activity   Alcohol use: Yes    Alcohol/week: 3.0 standard drinks of alcohol    Types: 3 Glasses of wine per week   Drug use: No   Sexual activity: Yes    Birth control/protection: Post-menopausal  Other Topics Concern   Not on file  Social History Narrative   Not on file   Social Determinants of Health   Financial Resource Strain: Not on file  Food Insecurity: Not on file  Transportation Needs: Not on file  Physical Activity: Not on  file  Stress: Not on file  Social Connections: Not on file  Intimate Partner Violence: Not on file    Review of Systems:    Constitutional: No weight loss, fever or chills Skin: No rash  Cardiovascular: No chest pain Respiratory: No SOB  Gastrointestinal: See HPI and otherwise negative Genitourinary: No dysuria Neurological: No headache, dizziness or syncope Musculoskeletal: No new muscle or joint pain Hematologic: No bleeding  Psychiatric: No history of depression or anxiety   Physical Exam:  Vital signs: BP  122/82   Pulse 92   Ht 5\' 3"  (1.6 m)   Wt 221 lb (100.2 kg)   SpO2 98%   BMI 39.15 kg/m    Constitutional:   Pleasant overweight Caucasian female appears to be in NAD, Well developed, Well nourished, alert and cooperative Head:  Normocephalic and atraumatic. Eyes:   PEERL, EOMI. No icterus. Conjunctiva pink. Ears:  Normal auditory acuity. Neck:  Supple Throat: Oral cavity and pharynx without inflammation, swelling or lesion.  Respiratory: Respirations even and unlabored. Lungs clear to auscultation bilaterally.   No wheezes, crackles, or rhonchi.  Cardiovascular: Normal S1, S2. No MRG. Regular rate and rhythm. No peripheral edema, cyanosis or pallor.  Gastrointestinal:  Soft, mild distention, marked TTP with involuntary guarding in lower abdomen.  Decreased bowel sounds lower quadrants, no appreciable masses or hepatomegaly. Rectal:  Not performed.  Msk:  Symmetrical without gross deformities. Without edema, no deformity or joint abnormality.  Neurologic:  Alert and  oriented x4;  grossly normal neurologically.  Skin:   Dry and intact without significant lesions or rashes. Psychiatric: Demonstrates good judgement and reason without abnormal affect or behaviors.  RELEVANT LABS AND IMAGING: CBC    Component Value Date/Time   WBC 6.7 06/22/2018 0434   RBC 3.64 (L) 06/22/2018 0434   HGB 11.4 (L) 06/22/2018 0434   HCT 36.7 06/22/2018 0434   PLT 200 06/22/2018 0434    MCV 100.8 (H) 06/22/2018 0434   MCH 31.3 06/22/2018 0434   MCHC 31.1 06/22/2018 0434   RDW 13.9 06/22/2018 0434   LYMPHSABS 1.6 06/16/2017 1010   MONOABS 0.8 06/16/2017 1010   EOSABS 0.1 06/16/2017 1010   BASOSABS 0.0 06/16/2017 1010    CMP     Component Value Date/Time   NA 138 11/24/2021 1330   K 3.8 11/24/2021 1330   CL 100 11/24/2021 1330   CO2 28 11/24/2021 1330   GLUCOSE 90 11/24/2021 1330   BUN 28 (H) 11/24/2021 1330   CREATININE 0.76 11/24/2021 1330   CREATININE 0.66 04/06/2016 0931   CALCIUM 9.8 11/24/2021 1330   PROT 6.0 (L) 06/20/2018 0419   ALBUMIN 3.4 (L) 06/20/2018 0419   AST 19 06/20/2018 0419   ALT 28 06/20/2018 0419   ALKPHOS 34 (L) 06/20/2018 0419   BILITOT 0.8 06/20/2018 0419   GFRNONAA >60 06/20/2018 0419   GFRAA >60 06/20/2018 0419    Assessment: 1.  Generalized abdominal pain: Worsening over the past 3 months, prior issues with diverticulitis status post hemicolectomy in 2020 for this, now just tender to touch rated as a 7's-9/10, nothing helps; consider IBS versus stricturing in the colon versus colitis versus other 2.  Change in bowel habits: Has episodes of urgent diarrheal stools in the middle of the night, seemingly unrelated to diet; consider IBS versus medication side effect on magnesium 3.  GERD: Describes a burning in her esophagus when she eats, previously on meds for GERD; likely GERD 4.  History of partial colectomy after recurrent diverticulitis in 2020  Plan: 1.  Scheduled patient for a CT of the abdomen pelvis with contrast. 2.  Pending above we will consider EGD/colonoscopy. 3.  Patient tells me she has had lab work done with her PCP since all this started and it was all normal.  We will request those results today. 4.  Recommend the patient's stop her Magnesium which can sometimes cause frequent bowel movements. 5.  Add Omeprazole 20 mg, 30-60 minutes before breakfast #30 with 5 refills. 6.  Prescribed Dicyclomine  10 mg 4 times  daily, 20-30 minutes before meals and at bedtime #120 with 3 refills. 7.  Patient to follow in clinic per recommendations after imaging above.  She had requested Dr. Adela Lank today.  Hyacinth Meeker, PA-C [facility] 04/22/2022, 10:51 AM  Cc: Loyola Mast, MD   Addendum: 10:15 AM 05/02/2022  08/18/2014 colonoscopy with Dr. Matthias Hughs showed one 6 mm polyp at the hepatic flexure, melanotic mucosa in the entire colon and diverticulosis.  08/18/2014 biopsies show hyperplastic colon polyp and melanosis coli-repeat colonoscopy recommended in 10 years  02/13/2013 EGD with Dr. Matthias Hughs for dysphagia and chest pain was normal.  No change in plans.  Repeat colonoscopy due in 2026 for screening.  Hyacinth Meeker, PA-C

## 2022-04-22 NOTE — Patient Instructions (Addendum)
_______________________________________________________  If you are age 69 or older, your body mass index should be between 23-30. Your Body mass index is 39.15 kg/m. If this is out of the aforementioned range listed, please consider follow up with your Primary Care Provider.  If you are age 5 or younger, your body mass index should be between 19-25. Your Body mass index is 39.15 kg/m. If this is out of the aformentioned range listed, please consider follow up with your Primary Care Provider.   ________________________________________________________  The [facility] providers would like to encourage you to use [facility] to communicate with providers for non-urgent requests or questions.  Due to long hold times on the telephone, sending your provider a message by [facility] may be a faster and more efficient way to get a response.  Please allow 48 business hours for a response.  Please remember that this is for non-urgent requests.   You have been scheduled for a CT scan of the abdomen and pelvis at [facility], 1st floor Radiology. You are scheduled on 05/02/22 at 3:00 pm. You should arrive 15 minutes prior to your appointment time for registration. Please follow the written instructions below on the day of your exam:   Do not eat anything after 11:00 am (4 hours prior to your test)    If you have any questions regarding your exam or if you need to reschedule, you may call [facility] at [phone] between the hours of 8:00 am and 5:00 pm, Monday-Friday.    Due to recent changes in healthcare laws, you may see the results of your imaging and laboratory studies on MyChart before your provider has had a chance to review them.  We understand that in some cases there may be results that are confusing or concerning to you. Not all laboratory results come back in the same time frame and the provider may be waiting for multiple results in order to interpret others.  Please give [place] 48  hours in order for your provider to thoroughly review all the results before contacting the office for clarification of your results.    We have sent the following medications to your pharmacy for you to pick up at your convenience:  Start taking omeprazole 20 mg 30-60 minutes before breakfast Start Dicyclomine 10 mg 4 time daily 20-30 minutes before meals and at bedtime Hold magnesium  Thank you for entrusting me with your care and choosing [facility].  Hyacinth Meeker PA-C

## 2022-04-29 ENCOUNTER — Other Ambulatory Visit: Payer: Self-pay | Admitting: Nurse Practitioner

## 2022-04-29 DIAGNOSIS — I10 Essential (primary) hypertension: Secondary | ICD-10-CM

## 2022-05-02 ENCOUNTER — Ambulatory Visit (HOSPITAL_COMMUNITY)
Admission: RE | Admit: 2022-05-02 | Discharge: 2022-05-02 | Disposition: A | Discharge status: Home / Self Care | Payer: BC Managed Care – PPO | Source: Ambulatory Visit | Attending: Physician Assistant | Admitting: Physician Assistant

## 2022-05-02 DIAGNOSIS — R197 Diarrhea, unspecified: Secondary | ICD-10-CM | POA: Insufficient documentation

## 2022-05-02 DIAGNOSIS — R1084 Generalized abdominal pain: Secondary | ICD-10-CM | POA: Diagnosis not present

## 2022-05-02 DIAGNOSIS — K219 Gastro-esophageal reflux disease without esophagitis: Secondary | ICD-10-CM | POA: Insufficient documentation

## 2022-05-02 DIAGNOSIS — R109 Unspecified abdominal pain: Secondary | ICD-10-CM | POA: Diagnosis not present

## 2022-05-02 DIAGNOSIS — I700 Atherosclerosis of aorta: Secondary | ICD-10-CM | POA: Diagnosis not present

## 2022-05-02 MED ORDER — IOHEXOL 300 MG/ML  SOLN
100.0000 mL | Freq: Once | INTRAMUSCULAR | Status: AC | PRN
  Administered 2022-05-02: 100 mL via INTRAVENOUS

## 2022-05-02 MED ORDER — SODIUM CHLORIDE (PF) 0.9 % IJ SOLN
INTRAMUSCULAR | Status: AC
  Filled 2022-05-02: qty 50

## 2022-05-03 ENCOUNTER — Other Ambulatory Visit: Payer: Self-pay | Admitting: Family Medicine

## 2022-05-03 DIAGNOSIS — E039 Hypothyroidism, unspecified: Secondary | ICD-10-CM

## 2022-05-03 MED ORDER — THYROID 120 MG PO TABS: 120.0000 mg | ORAL_TABLET | Freq: Every day | ORAL | 0 refills | Status: DC

## 2022-05-03 MED ORDER — THYROID 30 MG PO TABS: 30.0000 mg | ORAL_TABLET | Freq: Every day | ORAL | 0 refills | Status: DC

## 2022-05-03 NOTE — Addendum Note (Signed)
Addended by: Loyola Mast on: 05/03/2022 11:52 AM   Modules accepted: Orders

## 2022-05-04 NOTE — Telephone Encounter (Signed)
Patient scheduled for a f/u apportionment on 05/16/22 @ 3:20.  Dm/cma

## 2022-05-07 ENCOUNTER — Other Ambulatory Visit: Payer: Self-pay | Admitting: Family Medicine

## 2022-05-07 DIAGNOSIS — E039 Hypothyroidism, unspecified: Secondary | ICD-10-CM

## 2022-05-16 ENCOUNTER — Ambulatory Visit: Payer: BC Managed Care – PPO | Admitting: Family Medicine

## 2022-05-16 VITALS — BP 130/84 | HR 82 | Temp 96.8°F | Ht 63.0 in | Wt 222.8 lb

## 2022-05-16 DIAGNOSIS — E039 Hypothyroidism, unspecified: Secondary | ICD-10-CM | POA: Diagnosis not present

## 2022-05-16 DIAGNOSIS — I10 Essential (primary) hypertension: Secondary | ICD-10-CM | POA: Diagnosis not present

## 2022-05-16 DIAGNOSIS — Z6839 Body mass index (BMI) 39.0-39.9, adult: Secondary | ICD-10-CM | POA: Diagnosis not present

## 2022-05-16 NOTE — Progress Notes (Signed)
[facility] [address] [place] [address] Dept: [phone] Dept Fax: [phone]  Chronic Care Office Visit  Subjective:    Patient ID: Shannon Reyes, female    DOB: January 05, 1951, 71 y.o..   MRN: [id number]  Chief Complaint  Patient presents with   Follow-up    F/u meds.  No concerns.      History of Present Illness:  Patient is in today for reassessment of chronic medical issues.  [name] has a history of essential hypertension. She was being managed on chlorthalidone 25 mg daily and lisinopril 40 mg daily.    [name] has a history of hypothyroidism. She has been being managed at [facility] for this. She is on levothyroxine 50 mcg daily and Armour Thyroid (thyroid extract) 150 mg daily.  We had lowered her dose from 180 mcg at her last visit.   [name] feels frustrated by her weight. She notes she eats and overall healthy diet. She has been walking her dogs some, but does not currently engage in more strenuous exercise.  Past Medical History: Patient Active Problem List   Diagnosis Date Noted   Anxiety with depression 10/27/2021   ADD (attention deficit disorder) 10/27/2021   Hypothyroidism 07/12/2021   S/P hysterectomy with oophorectomy 07/12/2021   Diverticulitis of sigmoid colon s/p lap colectomy 06/19/2018 06/23/2018   S/P partial colectomy 06/19/2018   Degeneration of lumbar intervertebral disc 03/30/2018   Perennial allergic rhinitis 03/15/2016   Sensorineural hearing loss (SNHL), bilateral 03/15/2016   Tinnitus, bilateral 03/15/2016   GERD (gastroesophageal reflux disease) 01/25/2013   Essential hypertension 12/31/2012   Systolic murmur 12/31/2012   Coronary artery fistula 12/31/2012   Class 2 obesity due to excess calories with body mass index (BMI) of 39.0 to 39.9 in adult 12/06/2012   Past Surgical History:  Procedure Laterality Date   ABDOMINAL HYSTERECTOMY  1981   COMPLETE    ANKLE SURGERY Right 2004/2014   BLEPHAROPLASTY Left    CATARACT EXTRACTION Bilateral    LEFT OCT 2018, RIGHT NOV 2019   CESAREAN SECTION  1977 AND 1980   COLON RESECTION Left 06/19/2018   Procedure: LAPAROSCOPIC ASSISTED  LEFT COLON RESECTION;  Surgeon: Luretha Murphy, MD;  Location: [facility];  Service: General;  Laterality: Left;  ERAS PATHWAY   CYSTOSCOPY WITH STENT PLACEMENT Bilateral 06/19/2018   Procedure: CYSTOSCOPY WITH BILATERAL URETERAL CATHETER PLACEMENT;  Surgeon: Bjorn Pippin, MD;  Location: [facility];  Service: Urology;  Laterality: Bilateral;   OVARIAN CYST REMOVAL     Family History  Problem Relation Age of Onset   Heart disease Mother    Stroke Father    Hypertension Father    Heart disease Father    Diabetes Paternal Uncle    Parkinson's disease Maternal Grandmother    Cancer Paternal Grandmother        Pancreatic   Diabetes Paternal Grandfather    Breast cancer Neg Hx    Outpatient Medications Prior to Visit  Medication Sig Dispense Refill   buPROPion (WELLBUTRIN XL) 300 MG 24 hr tablet Take 300 mg by mouth daily.     chlorthalidone (HYGROTON) 25 MG tablet Take 1 tablet (25 mg total) by mouth daily. 90 tablet 3   dicyclomine (BENTYL) 10 MG capsule Take 1 capsule (10 mg total) by mouth 4 (four) times daily. Take 20-30 minutes before meals and at bedtime 120 capsule 0   estradiol (VIVELLE-DOT) 0.075 MG/24HR estradiol 0.075 mg/24 hr semiweekly transdermal  patch 8 patch 3   EVEKEO 10 MG TABS Take 10 mg by mouth daily as needed (ADHD).   0   levothyroxine (SYNTHROID) 50 MCG tablet Take 50 mcg by mouth daily before breakfast.     lisinopril (ZESTRIL) 40 MG tablet TAKE ONE TABLET BY MOUTH TWICE A DAY 180 tablet 0   omeprazole (PRILOSEC) 20 MG capsule Take 1 capsule (20 mg total) by mouth daily. Take 30-60 minutes before breakfast meal each day 30 capsule 5   thyroid (ARMOUR THYROID) 120 MG tablet Take 1 tablet (120 mg total) by mouth daily before breakfast. 30 tablet 0    thyroid (ARMOUR THYROID) 30 MG tablet Take 1 tablet (30 mg total) by mouth daily before breakfast. Needs appointment before next refill. 30 tablet 0   traZODone (DESYREL) 50 MG tablet Take 50 mg by mouth at bedtime.     progesterone (PROMETRIUM) 200 MG capsule Take by mouth.     No facility-administered medications prior to visit.   Allergies  Allergen Reactions   Erythromycin Base Itching   Codeine Itching    Objective:   Today's Vitals   05/16/22 1456  BP: 130/84  Pulse: 82  Temp: (!) 96.8 F (36 C)  TempSrc: Temporal  SpO2: 97%  Weight: 222 lb 12.8 oz (101.1 kg)  Height: 5\' 3"  (1.6 m)   Body mass index is 39.47 kg/m.   General: Well developed, well nourished. No acute distress. Psych: Alert and oriented. Normal mood and affect.  Health Maintenance Due  Topic Date Due   Hepatitis C Screening  Never done   Zoster Vaccines- Shingrix (1 of 2) Never done     Assessment & Plan:   1. Essential hypertension Blood pressure is adequately managed. Continue chlorthalidone 25 mg and lisinopril 40 mg daily.  2. Hypothyroidism, unspecified type We will recheck her TSH today. Plan to continue levothyroxine 50 mcg and Armour Thyroid 150 mg daily for now.  - TSH  3. Class 2 severe obesity due to excess calories with serious comorbidity and body mass index (BMI) of 39.0 to 39.9 in adult [facility]) We discussed diet and exercise as a foundation to her weight loss efforts. She is considering engaging in the [facility] Healthy Weight clinic.   Return in about 3 months (around 08/15/2022) for Reassessment.   10/15/2022, MD

## 2022-05-17 DIAGNOSIS — Z79899 Other long term (current) drug therapy: Secondary | ICD-10-CM | POA: Diagnosis not present

## 2022-05-17 LAB — TSH: TSH: 0.17 u[IU]/mL — ABNORMAL LOW (ref 0.35–5.50)

## 2022-05-18 ENCOUNTER — Encounter: Payer: Self-pay | Admitting: Family Medicine

## 2022-05-18 DIAGNOSIS — F411 Generalized anxiety disorder: Secondary | ICD-10-CM | POA: Diagnosis not present

## 2022-05-18 DIAGNOSIS — F902 Attention-deficit hyperactivity disorder, combined type: Secondary | ICD-10-CM | POA: Diagnosis not present

## 2022-05-18 DIAGNOSIS — F4325 Adjustment disorder with mixed disturbance of emotions and conduct: Secondary | ICD-10-CM | POA: Diagnosis not present

## 2022-05-18 DIAGNOSIS — F331 Major depressive disorder, recurrent, moderate: Secondary | ICD-10-CM | POA: Diagnosis not present

## 2022-05-18 DIAGNOSIS — E039 Hypothyroidism, unspecified: Secondary | ICD-10-CM

## 2022-05-20 MED ORDER — THYROID 15 MG PO TABS: 15.0000 mg | ORAL_TABLET | Freq: Every day | ORAL | 3 refills | Status: DC

## 2022-05-25 ENCOUNTER — Ambulatory Visit (INDEPENDENT_AMBULATORY_CARE_PROVIDER_SITE_OTHER): Payer: BC Managed Care – PPO | Admitting: Internal Medicine

## 2022-05-25 ENCOUNTER — Encounter (INDEPENDENT_AMBULATORY_CARE_PROVIDER_SITE_OTHER): Payer: Self-pay | Admitting: Internal Medicine

## 2022-05-25 VITALS — BP 138/84 | HR 84 | Temp 97.8°F | Ht 63.0 in | Wt 226.0 lb

## 2022-05-25 DIAGNOSIS — I10 Essential (primary) hypertension: Secondary | ICD-10-CM | POA: Diagnosis not present

## 2022-05-25 DIAGNOSIS — Z0289 Encounter for other administrative examinations: Secondary | ICD-10-CM

## 2022-05-25 DIAGNOSIS — Z6841 Body Mass Index (BMI) 40.0 and over, adult: Secondary | ICD-10-CM | POA: Diagnosis not present

## 2022-05-25 NOTE — Progress Notes (Signed)
Office: [phone]  /  Fax: [phone]   Initial Visit  Shannon Reyes was seen in clinic today to evaluate for obesity. She is interested in losing weight to improve overall health and reduce the risk of weight related complications. She presents today to review program treatment options, initial physical assessment, and evaluation.  She is an Psychologist, educational, works for [employer].  It is remote most days.  She is married and is trying to eat healthy.  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  This has affected her self-esteem and confidence.  She was referred by: Self-Referral  When asked what else they would like to accomplish? She states: Adopt healthier eating patterns, Improve existing medical conditions, Improve quality of life, Improve appearance, and Improve self-confidence  When asked how has your weight affected you? She states: Has affected self-esteem, Contributed to medical problems, and Problems with depression and or anxiety  Some associated conditions: Hypertension and GERD  Contributing factors: Disruption of circadian rhythm, Medications, Stress, and Menopause  Weight promoting medications identified: Psychotropic medications  Current nutrition plan: Portion control / smart choices  Current level of physical activity: Walking  Current or previous pharmacotherapy: None  Response to medication: Never tried medications   Past medical history includes:   Past Medical History:  Diagnosis Date   Diverticulitis    Diverticulosis    GERD (gastroesophageal reflux disease) YRS AGO   Hypertension    Obesity    Thyroid disease      Objective:   BP 138/84   Pulse 84   Temp 97.8 F (36.6 C)   Ht 5\' 3"  (1.6 m)   Wt 226 lb (102.5 kg)   SpO2 98%   BMI 40.03 kg/m  She was weighed on the bioimpedance scale: Body mass index is 40.03 kg/m.  Peak Weight: 223,Visceral Fat Rating: 16, Body Fat%: 49.6, Weight trend over the last 12 months:  Unchanged  General:  Alert, oriented and cooperative. Patient is in no acute distress.  Respiratory: Normal respiratory effort, no problems with respiration noted  Extremities: Normal range of motion.    Mental Status: Normal mood and affect. Normal behavior. Normal judgment and thought content.   Assessment and Plan:  1. Class 3 severe obesity with serious comorbidity and body mass index (BMI) of 40.0 to 44.9 in adult, unspecified obesity type (HCC) We reviewed weight, biometrics, associated medical conditions and contributing factors with patient. She would benefit from weight loss therapy via a modified calorie, low-carb, high-protein nutritional plan tailored to their REE (resting energy expenditure) which will be determined by indirect calorimetry.  We will also assess for cardiometabolic risk and nutritional derangements via fasting serologies at her next appointment.   2. Essential hypertension Blood pressure slightly above target of 130/80.  She is currently on lisinopril and chlorthalidone she denies any side effects.  She is on stimulating medications for her mental health.  Recommend that she check her blood pressure twice a day in the morning and also before bedtime and log.  We will check renal parameters with intake labs.        Obesity Treatment / Action Plan:  Patient will work on garnering support from family and friends to begin weight loss journey. Will work on eliminating or reducing the presence of highly palatable, calorie dense foods in the home. Will complete provided nutritional and psychosocial assessment questionnaire before the next appointment. Will be scheduled for indirect calorimetry to determine resting energy expenditure in a  fasting state.  This will allow [place] to create a reduced calorie, high-protein meal plan to promote loss of fat mass while preserving muscle mass. Will think about ideas on how to incorporate physical activity into their daily  routine. Counseled on the health benefits of losing 5%-15% of total body weight. Was counseled on nutritional approaches to weight loss and benefits of complex carbs and high quality protein as part of nutritional weight management. Was counseled on pharmacotherapy and role as an adjunct in weight management.   Obesity Education Performed Today:  She was weighed on the bioimpedance scale and results were discussed and documented in the synopsis.  We discussed obesity as a disease and the importance of a more detailed evaluation of all the factors contributing to the disease.  We discussed the importance of long term lifestyle changes which include nutrition, exercise and behavioral modifications as well as the importance of customizing this to her specific health and social needs.  We discussed the benefits of reaching a healthier weight to alleviate the symptoms of existing conditions and reduce the risks of the biomechanical, metabolic and psychological effects of obesity.  Shannon Reyes appears to be in the action stage of change and states they are ready to start intensive lifestyle modifications and behavioral modifications.  30 minutes was spent today on this visit including the above counseling, pre-visit chart review, and post-visit documentation.  Reviewed by clinician on day of visit: allergies, medications, problem list, medical history, surgical history, family history, social history, and previous encounter notes.    I have reviewed the above documentation for accuracy and completeness, and I agree with the above.  Worthy Rancher, MD

## 2022-06-04 ENCOUNTER — Other Ambulatory Visit: Payer: Self-pay | Admitting: Family Medicine

## 2022-06-04 DIAGNOSIS — E039 Hypothyroidism, unspecified: Secondary | ICD-10-CM

## 2022-06-20 ENCOUNTER — Ambulatory Visit: Payer: BC Managed Care – PPO | Admitting: Gastroenterology

## 2022-06-29 ENCOUNTER — Encounter (INDEPENDENT_AMBULATORY_CARE_PROVIDER_SITE_OTHER): Payer: Self-pay | Admitting: Internal Medicine

## 2022-06-29 ENCOUNTER — Ambulatory Visit (INDEPENDENT_AMBULATORY_CARE_PROVIDER_SITE_OTHER): Payer: No Typology Code available for payment source | Admitting: Internal Medicine

## 2022-06-29 VITALS — BP 133/86 | HR 75 | Temp 98.0°F | Ht 63.0 in | Wt 224.0 lb

## 2022-06-29 DIAGNOSIS — E039 Hypothyroidism, unspecified: Secondary | ICD-10-CM | POA: Diagnosis not present

## 2022-06-29 DIAGNOSIS — R799 Abnormal finding of blood chemistry, unspecified: Secondary | ICD-10-CM

## 2022-06-29 DIAGNOSIS — R0602 Shortness of breath: Secondary | ICD-10-CM

## 2022-06-29 DIAGNOSIS — Z1331 Encounter for screening for depression: Secondary | ICD-10-CM

## 2022-06-29 DIAGNOSIS — E669 Obesity, unspecified: Secondary | ICD-10-CM

## 2022-06-29 DIAGNOSIS — I10 Essential (primary) hypertension: Secondary | ICD-10-CM | POA: Diagnosis not present

## 2022-06-29 DIAGNOSIS — E559 Vitamin D deficiency, unspecified: Secondary | ICD-10-CM | POA: Diagnosis not present

## 2022-06-29 DIAGNOSIS — R5383 Other fatigue: Secondary | ICD-10-CM | POA: Diagnosis not present

## 2022-06-29 DIAGNOSIS — G4709 Other insomnia: Secondary | ICD-10-CM

## 2022-06-29 DIAGNOSIS — Z6839 Body mass index (BMI) 39.0-39.9, adult: Secondary | ICD-10-CM

## 2022-06-30 LAB — COMPREHENSIVE METABOLIC PANEL
ALT: 19 IU/L (ref 0–32)
AST: 17 IU/L (ref 0–40)
Albumin/Globulin Ratio: 1.9 (ref 1.2–2.2)
Albumin: 4.7 g/dL (ref 3.9–4.9)
Alkaline Phosphatase: 56 IU/L (ref 44–121)
BUN/Creatinine Ratio: 32 — ABNORMAL HIGH (ref 12–28)
BUN: 23 mg/dL (ref 8–27)
Bilirubin Total: 0.3 mg/dL (ref 0.0–1.2)
CO2: 20 mmol/L (ref 20–29)
Calcium: 10.3 mg/dL (ref 8.7–10.3)
Chloride: 98 mmol/L (ref 96–106)
Creatinine, Ser: 0.71 mg/dL (ref 0.57–1.00)
Globulin, Total: 2.5 g/dL (ref 1.5–4.5)
Glucose: 92 mg/dL (ref 70–99)
Potassium: 3.8 mmol/L (ref 3.5–5.2)
Sodium: 138 mmol/L (ref 134–144)
Total Protein: 7.2 g/dL (ref 6.0–8.5)
eGFR: 91 mL/min/{1.73_m2} (ref >59)

## 2022-06-30 LAB — CBC WITH DIFFERENTIAL/PLATELET
Basophils Absolute: 0.1 10*3/uL (ref 0.0–0.2)
Basos: 1 %
EOS (ABSOLUTE): 0.2 10*3/uL (ref 0.0–0.4)
Eos: 3 %
Hematocrit: 42.4 % (ref 34.0–46.6)
Hemoglobin: 14.4 g/dL (ref 11.1–15.9)
Immature Grans (Abs): 0 10*3/uL (ref 0.0–0.1)
Immature Granulocytes: 0 %
Lymphocytes Absolute: 2 10*3/uL (ref 0.7–3.1)
Lymphs: 28 %
MCH: 32.3 pg (ref 26.6–33.0)
MCHC: 34 g/dL (ref 31.5–35.7)
MCV: 95 fL (ref 79–97)
Monocytes Absolute: 0.5 10*3/uL (ref 0.1–0.9)
Monocytes: 7 %
Neutrophils Absolute: 4.3 10*3/uL (ref 1.4–7.0)
Neutrophils: 61 %
Platelets: 254 10*3/uL (ref 150–450)
RBC: 4.46 x10E6/uL (ref 3.77–5.28)
RDW: 12 % (ref 11.7–15.4)
WBC: 7.1 10*3/uL (ref 3.4–10.8)

## 2022-06-30 LAB — LIPID PANEL WITH LDL/HDL RATIO
Cholesterol, Total: 179 mg/dL (ref 100–199)
HDL: 57 mg/dL (ref >39)
LDL Chol Calc (NIH): 99 mg/dL (ref 0–99)
LDL/HDL Ratio: 1.7 ratio (ref 0.0–3.2)
Triglycerides: 129 mg/dL (ref 0–149)
VLDL Cholesterol Cal: 23 mg/dL (ref 5–40)

## 2022-06-30 LAB — VITAMIN B12: Vitamin B-12: 560 pg/mL (ref 232–1245)

## 2022-06-30 LAB — TSH+FREE T4
Free T4: 1.07 ng/dL (ref 0.82–1.77)
TSH: 0.537 u[IU]/mL (ref 0.450–4.500)

## 2022-06-30 LAB — HEMOGLOBIN A1C
Est. average glucose Bld gHb Est-mCnc: 105 mg/dL
Hgb A1c MFr Bld: 5.3 % (ref 4.8–5.6)

## 2022-06-30 LAB — INSULIN, RANDOM: INSULIN: 32.5 u[IU]/mL — ABNORMAL HIGH (ref 2.6–24.9)

## 2022-06-30 LAB — VITAMIN D 25 HYDROXY (VIT D DEFICIENCY, FRACTURES): Vit D, 25-Hydroxy: 42.1 ng/mL (ref 30.0–100.0)

## 2022-07-05 NOTE — Progress Notes (Unsigned)
Chief Complaint:   OBESITY Shannon Reyes (MR# [id number]) is a 72 y.o. female who presents for evaluation and treatment of obesity and related comorbidities. Current BMI is Body mass index is 39.68 kg/m. Shannon Reyes has been struggling with her weight for many years and has been unsuccessful in either losing weight, maintaining weight loss, or reaching her healthy weight goal.  Shannon Reyes is currently in the action stage of change and ready to dedicate time achieving and maintaining a healthier weight. Shannon Reyes is interested in becoming our patient and working on intensive lifestyle modifications including (but not limited to) diet and exercise for weight loss.  Shannon Reyes's habits were reviewed today and are as follows: Her family eats meals together, she thinks her family will eat healthier with her, her desired weight loss is 74 lbs, she has been heavy most of her life, she started gaining weight at age 67+, her heaviest weight ever was 240 pounds, she has significant food cravings issues, she wakes up frequently in the middle of the night to eat, she skips meals frequently, she is frequently drinking liquids with calories, she frequently eats larger portions than normal, and she struggles with emotional eating.  Depression Screen Shannon Reyes's Food and Mood (modified PHQ-9) score was 8.  Subjective:   1. Other fatigue Shannon Reyes admits to daytime somnolence and admits to waking up still tired. Patient has a history of symptoms of daytime fatigue, Epworth sleepiness scale, and morning headache. Shannon Reyes generally gets 4 or 6 hours of sleep per night, and states that she has nightime awakenings. Snoring is present. Apneic episodes are not present. Epworth Sleepiness Score is 3.   2. SOB (shortness of breath) on exertion Shannon Reyes notes increasing shortness of breath with exercising and seems to be worsening over time with weight gain. She notes getting out of breath sooner with activity than she used to. This has not gotten  worse recently. Shannon Reyes denies shortness of breath at rest or orthopnea. IC markedly increased. Drank coffee this morning, also low TSH on thyroid replacement.   3. Hypothyroidism, unspecified type Low TSH 0.17 recently. On TRT. Reviewed risks associated with hyperthyroid state. Her TRT has been lowered recently. I discussed labs with the patient today.   4. Other insomnia Multifactorial. Stimulants, low TSH and ETOH in the evenings.   5. Essential hypertension Blood pressure not at goal. On ACE HCTZ. Several compounding, noticeable factors reviewed with the patient.   6. Vitamin D deficiency She is not currently taking vitamin D supplement. She denies nausea, vomiting or muscle weakness.  7. Abnormal finding of blood chemistry, unspecified  Assessment/Plan:   1. Other fatigue Shannon Reyes does feel that her weight is causing her energy to be lower than it should be. Fatigue may be related to obesity, depression or many other causes. Labs will be ordered, and in the meanwhile, Shannon Reyes will focus on self care including making healthy food choices, increasing physical activity and focusing on stress reduction.  - EKG 12-Lead - Vitamin B12  2. SOB (shortness of breath) on exertion Shannon Reyes does feel that she gets out of breath more easily that she used to when she exercises. Shannon Reyes's shortness of breath appears to be obesity related and exercise induced. She has agreed to work on weight loss and gradually increase exercise to treat her exercise induced shortness of breath. Will continue to monitor closely.  3. Hypothyroidism, unspecified type We will check labs today, and will follow-up at her next office visit.   -  TSH + free T4  4. Other insomnia Counseled on sleep hygiene. Given information on stress and sleep.   5. Essential hypertension We will check labs today. Work on weight loss therapy. Continue current regimen. Monitor at home. Intensify therapy if relaxation and decreased TRT does not  work.   - Hemoglobin A1c  6. Vitamin D deficiency We will check labs today, and will follow-up at her next office visit.   - VITAMIN D 25 Hydroxy (Vit-D Deficiency, Fractures)  7. Abnormal finding of blood chemistry, unspecified We will check labs today, and will follow-up at her next office visit.   - Hemoglobin A1c  8. Depression screen Shannon Reyes had a positive depression screening. Depression is commonly associated with obesity and often results in emotional eating behaviors. We will monitor this closely and work on CBT to help improve the non-hunger eating patterns. Referral to Psychology may be required if no improvement is seen as she continues in our clinic.  9. Obesity with current BMI of 39.8 We will check labs today.   - CBC with Differential/Platelet - Comprehensive metabolic panel - Hemoglobin A1c - Insulin, random - Lipid Panel With LDL/HDL Ratio  Shannon Reyes is currently in the action stage of change and her goal is to continue with weight loss efforts. I recommend Shannon Reyes begin the structured treatment plan as follows:  She has agreed to the Category 2 Plan.  Will use calculated BMR versus IC (unreliable). Drank coffee and low TSH. Stress and anxiety handout was provided.   Exercise goals: All adults should avoid inactivity. Some physical activity is better than none, and adults who participate in any amount of physical activity gain some health benefits.   Behavioral modification strategies: increasing lean protein intake, decreasing simple carbohydrates, increasing vegetables, increasing water intake, decreasing liquid calories, decreasing eating out, no skipping meals, meal planning and cooking strategies, better snacking choices, avoiding temptations, and planning for success.  She was informed of the importance of frequent follow-up visits to maximize her success with intensive lifestyle modifications for her multiple health conditions. She was informed we would discuss  her lab results at her next visit unless there is a critical issue that needs to be addressed sooner. Shannon Reyes agreed to keep her next visit at the agreed upon time to discuss these results.  Objective:   Blood pressure 133/86, pulse 75, temperature 98 F (36.7 C), height 5\' 3"  (1.6 m), weight 224 lb (101.6 kg), SpO2 98 %. Body mass index is 39.68 kg/m.  EKG: Normal sinus rhythm, rate 78 BPM.  Indirect Calorimeter completed today shows a VO2 of 358 and a REE of 2477.  Her calculated basal metabolic rate is 7096 thus her basal metabolic rate is better than expected.  General: Cooperative, alert, well developed, in no acute distress. HEENT: Conjunctivae and lids unremarkable. Cardiovascular: Regular rhythm.  Lungs: Normal work of breathing. Neurologic: No focal deficits.   Lab Results  Component Value Date   CREATININE 0.71 06/29/2022   BUN 23 06/29/2022   NA 138 06/29/2022   K 3.8 06/29/2022   CL 98 06/29/2022   CO2 20 06/29/2022   Lab Results  Component Value Date   ALT 19 06/29/2022   AST 17 06/29/2022   ALKPHOS 56 06/29/2022   BILITOT 0.3 06/29/2022   Lab Results  Component Value Date   HGBA1C 5.3 06/29/2022   HGBA1C 4.9 06/15/2018   Lab Results  Component Value Date   INSULIN 32.5 (H) 06/29/2022   Lab Results  Component Value  Date   TSH 0.537 06/29/2022   Lab Results  Component Value Date   CHOL 179 06/29/2022   HDL 57 06/29/2022   LDLCALC 99 06/29/2022   TRIG 129 06/29/2022   Lab Results  Component Value Date   WBC 7.1 06/29/2022   HGB 14.4 06/29/2022   HCT 42.4 06/29/2022   MCV 95 06/29/2022   PLT 254 06/29/2022   No results found for: "IRON", "TIBC", "FERRITIN"  Attestation Statements:   Reviewed by clinician on day of visit: allergies, medications, problem list, medical history, surgical history, family history, social history, and previous encounter notes.  Time spent on visit including pre-visit chart review and post-visit charting and care  was 40 minutes.   Wilhemena Durie, am acting as transcriptionist for Thomes Dinning, MD.   I have reviewed the above documentation for accuracy and completeness, and I agree with the above. -Thomes Dinning, MD

## 2022-07-10 ENCOUNTER — Other Ambulatory Visit: Payer: Self-pay | Admitting: Physician Assistant

## 2022-07-13 ENCOUNTER — Encounter (INDEPENDENT_AMBULATORY_CARE_PROVIDER_SITE_OTHER): Payer: Self-pay | Admitting: Internal Medicine

## 2022-07-13 ENCOUNTER — Ambulatory Visit (INDEPENDENT_AMBULATORY_CARE_PROVIDER_SITE_OTHER): Payer: No Typology Code available for payment source | Admitting: Internal Medicine

## 2022-07-13 VITALS — BP 142/86 | HR 85 | Temp 98.7°F | Ht 63.0 in | Wt 225.0 lb

## 2022-07-13 DIAGNOSIS — E039 Hypothyroidism, unspecified: Secondary | ICD-10-CM

## 2022-07-13 DIAGNOSIS — Z6839 Body mass index (BMI) 39.0-39.9, adult: Secondary | ICD-10-CM

## 2022-07-13 DIAGNOSIS — I10 Essential (primary) hypertension: Secondary | ICD-10-CM

## 2022-07-13 DIAGNOSIS — E88819 Insulin resistance, unspecified: Secondary | ICD-10-CM | POA: Diagnosis not present

## 2022-07-13 DIAGNOSIS — E6609 Other obesity due to excess calories: Secondary | ICD-10-CM

## 2022-07-13 DIAGNOSIS — E669 Obesity, unspecified: Secondary | ICD-10-CM

## 2022-07-13 NOTE — Assessment & Plan Note (Signed)
Most recent TSH was 0.537 and improved.  She is on TRT without any side effects.  She will continue on TRT losing weight may reduce requirements.  Continue current regimen

## 2022-07-13 NOTE — Assessment & Plan Note (Signed)
Blood pressure not at goal for age and risk category.  On chlorthalidone and lisinopril with with occasional leg cramps likely secondary to inadequate fluid intake..  Most recent renal parameters reviewed which showed normal electrolytes and kidney function but BUN was elevated.  Continue with weight loss therapy.  Monitor for symptoms of orthostasis while losing weight. Continue current regimen and home monitoring for a goal blood pressure of 120/80.  She will work with her primary care team to intensify medical therapy if needed.  She was also informed that the amphetamines, bupropion as well as stress may be contributing to elevated blood pressure.  She is also working on reducing sodium in her diet.

## 2022-07-13 NOTE — Progress Notes (Signed)
Office: [phone]  /  Fax: [phone]  WEIGHT SUMMARY AND BIOMETRICS  Medical Weight Loss Height: 5\' 3"  (1.6 m) Weight: 225 lb (102.1 kg) Temp: 98.7 F (37.1 C) Pulse Rate: 85 BP: (!) 142/86 SpO2: 98 % Fasting: no Labs: no Today's Visit #: 2 Weight at Last VIsit: 224 lb Weight Lost Since Last Visit: 0 lb  Body Fat %: 49 % Fat Mass (lbs): 110.4 lbs Muscle Mass (lbs): 109.2 lbs Total Body Water (lbs): 76 lbs Visceral Fat Rating : 17 Peak Weight: 230 lb Starting Date: 06/29/22 Starting Weight: 224 lb Total Weight Loss (lbs): 0 lb (0 kg)    HPI  Chief Complaint: OBESITY  Shannon Reyes is here to discuss her progress with her obesity treatment plan. She is on the keeping a food journal and adhering to recommended goals of 1200 calories and 80-100 protein and states she is following her eating plan approximately 85 % of the time. She states she is not exercising.   Interval History: Since last office she has remained weight neutral.  She has made significant changes.  She is no longer skipping meals and has decreased her protein intake.  She has also decreased liquid calories.  She notes an improvement in appetite, cravings as well as satiety.  She is also sleeping better and no longer waking up to eat.  She feels she has a little bit more energy when she wakes up.  She is thinking about ways to increase physical activity.  She also inquires about pharmacotherapy.    Pharmacotherapy: None  PHYSICAL EXAM:  Blood pressure (!) 142/86, pulse 85, temperature 98.7 F (37.1 C), height 5\' 3"  (1.6 m), weight 225 lb (102.1 kg), SpO2 98 %. Body mass index is 39.86 kg/m.  General: She is overweight, cooperative, alert, well developed, and in no acute distress. PSYCH: Has normal mood, affect and thought process.   HEENT: EOMI, sclerae are anicteric. Lungs: Normal breathing effort, no conversational dyspnea. Extremities: No edema.  Neurologic: No gross sensory or motor deficits. No  tremors or fasciculations noted.    DIAGNOSTIC DATA REVIEWED:  BMET    Component Value Date/Time   NA 138 06/29/2022 1050   K 3.8 06/29/2022 1050   CL 98 06/29/2022 1050   CO2 20 06/29/2022 1050   GLUCOSE 92 06/29/2022 1050   GLUCOSE 90 11/24/2021 1330   BUN 23 06/29/2022 1050   CREATININE 0.71 06/29/2022 1050   CREATININE 0.66 04/06/2016 0931   CALCIUM 10.3 06/29/2022 1050   GFRNONAA >60 06/20/2018 0419   GFRAA >60 06/20/2018 0419   Lab Results  Component Value Date   HGBA1C 5.3 06/29/2022   HGBA1C 4.9 06/15/2018   Lab Results  Component Value Date   INSULIN 32.5 (H) 06/29/2022   Lab Results  Component Value Date   TSH 0.537 06/29/2022   CBC    Component Value Date/Time   WBC 7.1 06/29/2022 1050   WBC 6.7 06/22/2018 0434   RBC 4.46 06/29/2022 1050   RBC 3.64 (L) 06/22/2018 0434   HGB 14.4 06/29/2022 1050   HCT 42.4 06/29/2022 1050   PLT 254 06/29/2022 1050   MCV 95 06/29/2022 1050   MCH 32.3 06/29/2022 1050   MCH 31.3 06/22/2018 0434   MCHC 34.0 06/29/2022 1050   MCHC 31.1 06/22/2018 0434   RDW 12.0 06/29/2022 1050   Iron Studies No results found for: "IRON", "TIBC", "FERRITIN", "IRONPCTSAT" Lipid Panel     Component Value Date/Time   CHOL 179 06/29/2022 1050  TRIG 129 06/29/2022 1050   HDL 57 06/29/2022 1050   LDLCALC 99 06/29/2022 1050   Hepatic Function Panel     Component Value Date/Time   PROT 7.2 06/29/2022 1050   ALBUMIN 4.7 06/29/2022 1050   AST 17 06/29/2022 1050   ALT 19 06/29/2022 1050   ALKPHOS 56 06/29/2022 1050   BILITOT 0.3 06/29/2022 1050      Component Value Date/Time   TSH 0.537 06/29/2022 1050   Nutritional Lab Results  Component Value Date   VD25OH 42.1 06/29/2022     ASSESSMENT AND PLAN  TREATMENT PLAN FOR OBESITY:  Recommended Dietary Goals  Shannon Reyes is currently in the action stage of change. As such, her goal is to continue weight management plan. She has agreed to the Category 2 Plan.  Behavioral  Intervention  We discussed the following Behavioral Modification Strategies today: increasing lean protein intake, decreasing simple carbohydrates , increasing vegetables, avoid skipping meals, think about ways to increase physical activity, avoid or reduce consumption of processed foods, reading food labels and making healthy choices when eating convenient foods, and increase water .  Additional resources provided today: NA  Recommended Physical Activity Goals  Shannon Reyes has been advised to work up to 150 minutes of moderate intensity aerobic activity a week and strengthening exercises 2-3 times per week for cardiovascular health, weight loss maintenance and preservation of muscle mass.   She has agreed to increase physical activity in their day and reduce sedentary time (increase NEAT).  and Patient also encouraged on scheduling and tracking physical activity.    Pharmacotherapy We discussed various medication options to help [name] with her weight loss efforts and we both agreed to talk about incretin therapy next time. She feels medication would help to support her motivation.   ASSOCIATED CONDITIONS ADDRESSED TODAY  Essential hypertension Assessment & Plan: Blood pressure not at goal for age and risk category.  On chlorthalidone and lisinopril with with occasional leg cramps likely secondary to inadequate fluid intake..  Most recent renal parameters reviewed which showed normal electrolytes and kidney function but BUN was elevated.  Continue with weight loss therapy.  Monitor for symptoms of orthostasis while losing weight. Continue current regimen and home monitoring for a goal blood pressure of 120/80.  She will work with her primary care team to intensify medical therapy if needed.  She was also informed that the amphetamines, bupropion as well as stress may be contributing to elevated blood pressure.  She is also working on reducing sodium in her diet.    Hypothyroidism, unspecified  type Assessment & Plan: Most recent TSH was 0.537 and improved.  She is on TRT without any side effects.  She will continue on TRT losing weight may reduce requirements.  Continue current regimen   Class 2 obesity due to excess calories without serious comorbidity with body mass index (BMI) of 39.0 to 39.9 in adult  Insulin resistance Assessment & Plan: Her insulin levels are elevated. Optimal levels are < 7. This is a complex condition associated to genetic, central adiposity and lifestyle factors. High insulin levels can result in weight gain, abnormal cravings (particularly for carbs) and fatigue. These may result in additional weight gain and worsening IR. Overtime, it can lead to pre-diabetes and diabetes if untreated.   Lab Results  Component Value Date   HGBA1C 5.3 06/29/2022   Lab Results  Component Value Date   INSULIN 32.5 (H) 06/29/2022    I recommend ongoing weight loss therapy (10% of TBW),  increasing physical activity, reducing highly processed, high carbohydrate foods and saturated fats in diet.        Return in about 2 weeks (around 07/27/2022) for For Weight Mangement with Dr. Gerarda Fraction.Marland Kitchen She was informed of the importance of frequent follow up visits to maximize her success with intensive lifestyle modifications for her multiple health conditions.   ATTESTASTION STATEMENTS:  Reviewed by clinician on day of visit: allergies, medications, problem list, medical history, surgical history, family history, social history, and previous encounter notes.   Time spent on visit including pre-visit chart review and post-visit care and charting was 40 minutes.    Thomes Dinning, MD

## 2022-07-13 NOTE — Assessment & Plan Note (Signed)
Her insulin levels are elevated. Optimal levels are < 7. This is a complex condition associated to genetic, central adiposity and lifestyle factors. High insulin levels can result in weight gain, abnormal cravings (particularly for carbs) and fatigue. These may result in additional weight gain and worsening IR. Overtime, it can lead to pre-diabetes and diabetes if untreated.   Lab Results  Component Value Date   HGBA1C 5.3 06/29/2022   Lab Results  Component Value Date   INSULIN 32.5 (H) 06/29/2022    I recommend ongoing weight loss therapy (10% of TBW), increasing physical activity, reducing highly processed, high carbohydrate foods and saturated fats in diet.

## 2022-07-21 ENCOUNTER — Encounter: Payer: Self-pay | Admitting: Family Medicine

## 2022-07-21 DIAGNOSIS — N951 Menopausal and female climacteric states: Secondary | ICD-10-CM

## 2022-07-22 MED ORDER — ESTRADIOL 0.025 MG/24HR TD PTTW: 1.0000 | MEDICATED_PATCH | TRANSDERMAL | 12 refills | Status: DC

## 2022-07-22 NOTE — Addendum Note (Signed)
Addended by: Haydee Salter on: 07/22/2022 05:54 PM   Modules accepted: Orders

## 2022-07-27 ENCOUNTER — Encounter (INDEPENDENT_AMBULATORY_CARE_PROVIDER_SITE_OTHER): Payer: Self-pay | Admitting: Internal Medicine

## 2022-07-27 ENCOUNTER — Ambulatory Visit (INDEPENDENT_AMBULATORY_CARE_PROVIDER_SITE_OTHER): Payer: No Typology Code available for payment source | Admitting: Internal Medicine

## 2022-07-27 VITALS — BP 138/84 | HR 91 | Temp 98.8°F | Ht 63.0 in | Wt 226.0 lb

## 2022-07-27 DIAGNOSIS — E66811 Obesity, class 1: Secondary | ICD-10-CM | POA: Insufficient documentation

## 2022-07-27 DIAGNOSIS — E6609 Other obesity due to excess calories: Secondary | ICD-10-CM | POA: Insufficient documentation

## 2022-07-27 DIAGNOSIS — I10 Essential (primary) hypertension: Secondary | ICD-10-CM | POA: Diagnosis not present

## 2022-07-27 DIAGNOSIS — E88819 Insulin resistance, unspecified: Secondary | ICD-10-CM | POA: Diagnosis not present

## 2022-07-27 DIAGNOSIS — Z6841 Body Mass Index (BMI) 40.0 and over, adult: Secondary | ICD-10-CM | POA: Diagnosis not present

## 2022-07-27 MED ORDER — SEMAGLUTIDE-WEIGHT MANAGEMENT 0.25 MG/0.5ML ~~LOC~~ SOAJ: 0.2500 mg | SUBCUTANEOUS | 0 refills | Status: DC

## 2022-07-27 NOTE — Assessment & Plan Note (Signed)
Blood pressure not at goal for age and risk category.  On chlorthalidone and lisinopril.  Her goal blood pressure is less than 130/80 considering age.  Continue with weight loss therapy.  Monitor for symptoms of orthostasis while losing weight. Continue current regimen.  Advised to check blood pressure in the morning and also before bedtime.  She will work with her primary care team to intensify medical therapy if needed.

## 2022-07-27 NOTE — Progress Notes (Signed)
Office: [phone]  /  Fax: [facility] Height: 5' 3"$  (1.6 m) Weight: 226 lb (102.5 kg) Temp: 98.8 F (37.1 C) Pulse Rate: 91 BP: 138/84 SpO2: 99 % Fasting: n Labs: n Today's Visit #: 3 Weight at Last VIsit: 225 lb  Body Fat %: 49.1 % Fat Mass (lbs): 111 lbs Muscle Mass (lbs): 109.2 lbs Total Body Water (lbs): 78.4 lbs Visceral Fat Rating : 17 Peak Weight: 230 lb Starting Date: 06/29/22 Starting Weight: 224 lb Total Weight Loss (lbs): 0 lb (0 kg)    HPI  Chief Complaint: OBESITY  Shannon Reyes is here to discuss her progress with her obesity treatment plan. She is on the the Category 2 Plan and states she is following her eating plan approximately 90 % of the time. She states she is exercising 15 minutes 3 times per week.   Interval History:  Since last office visit she has gained 1 pound she reports good adherence to reduced calorie nutrition plan.  She was recently sick for about a week and was mostly drinking fluids and eating soup.  She has also increased physical activity and has been eating more fruits and vegetables.  She denies consumption of liquid calories.  She is experiencing some problems with hunger signals and cravings.    Pharmacotherapy: None  PHYSICAL EXAM:  Blood pressure 138/84, pulse 91, temperature 98.8 F (37.1 C), height 5' 3"$  (1.6 m), weight 226 lb (102.5 kg), SpO2 99 %. Body mass index is 40.03 kg/m.  General: She is overweight, cooperative, alert, well developed, and in no acute distress. PSYCH: Has normal mood, affect and thought process.   HEENT: EOMI, sclerae are anicteric. Lungs: Normal breathing effort, no conversational dyspnea. Extremities: No edema.  Neurologic: No gross sensory or motor deficits. No tremors or fasciculations noted.    DIAGNOSTIC DATA REVIEWED:  BMET    Component Value Date/Time   NA 138 06/29/2022 1050   K 3.8 06/29/2022 1050   CL 98 06/29/2022 1050    CO2 20 06/29/2022 1050   GLUCOSE 92 06/29/2022 1050   GLUCOSE 90 11/24/2021 1330   BUN 23 06/29/2022 1050   CREATININE 0.71 06/29/2022 1050   CREATININE 0.66 04/06/2016 0931   CALCIUM 10.3 06/29/2022 1050   GFRNONAA >60 06/20/2018 0419   GFRAA >60 06/20/2018 0419   Lab Results  Component Value Date   HGBA1C 5.3 06/29/2022   HGBA1C 4.9 06/15/2018   Lab Results  Component Value Date   INSULIN 32.5 (H) 06/29/2022   Lab Results  Component Value Date   TSH 0.537 06/29/2022   CBC    Component Value Date/Time   WBC 7.1 06/29/2022 1050   WBC 6.7 06/22/2018 0434   RBC 4.46 06/29/2022 1050   RBC 3.64 (L) 06/22/2018 0434   HGB 14.4 06/29/2022 1050   HCT 42.4 06/29/2022 1050   PLT 254 06/29/2022 1050   MCV 95 06/29/2022 1050   MCH 32.3 06/29/2022 1050   MCH 31.3 06/22/2018 0434   MCHC 34.0 06/29/2022 1050   MCHC 31.1 06/22/2018 0434   RDW 12.0 06/29/2022 1050   Iron Studies No results found for: "IRON", "TIBC", "FERRITIN", "IRONPCTSAT" Lipid Panel     Component Value Date/Time   CHOL 179 06/29/2022 1050   TRIG 129 06/29/2022 1050   HDL 57 06/29/2022 1050   LDLCALC 99 06/29/2022 1050   Hepatic Function Panel     Component Value Date/Time   PROT 7.2 06/29/2022 1050  ALBUMIN 4.7 06/29/2022 1050   AST 17 06/29/2022 1050   ALT 19 06/29/2022 1050   ALKPHOS 56 06/29/2022 1050   BILITOT 0.3 06/29/2022 1050      Component Value Date/Time   TSH 0.537 06/29/2022 1050   Nutritional Lab Results  Component Value Date   VD25OH 42.1 06/29/2022     ASSESSMENT AND PLAN  TREATMENT PLAN FOR OBESITY:  Recommended Dietary Goals  Shannon Reyes is currently in the action stage of change. As such, her goal is to continue weight management plan. She has agreed to the Category 2 Plan.  Behavioral Intervention  We discussed the following Behavioral Modification Strategies today: increasing lean protein intake, increasing vegetables, increasing fiber rich foods, avoid skipping  meals, increase water intake, and think about ways to increase physical activity.  Additional resources provided today: NA  Recommended Physical Activity Goals  Shannon Reyes has been advised to work up to 150 minutes of moderate intensity aerobic activity a week and strengthening exercises 2-3 times per week for cardiovascular health, weight loss maintenance and preservation of muscle mass.   She has agreed to increase physical activity in their day and reduce sedentary time (increase NEAT).  Continue with exercise as is.    Pharmacotherapy We discussed various medication options to help Shannon Reyes with her weight loss efforts and we both agreed to In addition to reduced calorie nutrition plan (RCNP), behavioral strategies and physical activity, Shannon Reyes would benefit from pharmacotherapy to assist with hunger signals, satiety and cravings. This will reduce obesity-related health risks by inducing weight loss, and help reduce food consumption and adherence to [facility]) . It may also improve QOL by improving self-confidence and reduce the  setbacks associated with metabolic adaptations - which is an inevitable part of this disease.    After discussion of medical treatment options, mechanisms of action, benefits, side effects and risks, indications for long-term use and shared decision making she is agreeable to starting wegovy .25 mg once a week. Patient also made aware of risk of weight regain following discontinuation of treatment and hence the importance of adhering to medical weight loss plan.  We demonstrated use of device and patient using teach back method was able to demonstrate proper technique. .  ASSOCIATED CONDITIONS ADDRESSED TODAY  Essential hypertension Assessment & Plan: Blood pressure not at goal for age and risk category.  On chlorthalidone and lisinopril.  Her goal blood pressure is less than 130/80 considering age.  Continue with weight loss therapy.  Monitor for symptoms of orthostasis while  losing weight. Continue current regimen.  Advised to check blood pressure in the morning and also before bedtime.  She will work with her primary care team to intensify medical therapy if needed.    Class 3 severe obesity with serious comorbidity and body mass index (BMI) of 40.0 to 44.9 in adult, unspecified obesity type (HCC)  Insulin resistance Assessment & Plan: Her HOMA-IR is 8 which is significant insulin resistance. Optimal level < 1.9. This is complex condition associated with genetics, ectopic fat and lifestyle factors. Insulin resistance may result in weight gain, abnormal cravings (particularly for carbs) and fatigue. This may result in additional weight gain and lead to pre-diabetes and diabetes if untreated.   Lab Results  Component Value Date   HGBA1C 5.3 06/29/2022   Lab Results  Component Value Date   INSULIN 32.5 (H) 06/29/2022   Lab Results  Component Value Date   GLUCOSE 92 06/29/2022   GLUCOSE 101 (H) 03/22/2016    I  recommend ongoing weight loss therapy (10% of TBW), increasing physical activity, reducing processed, simple sugars and saturated fats in diet.  She also benefits from pharmacotherapy with incretin mimetic.    Other orders -     Semaglutide-Weight Management; Inject 0.25 mg into the skin once a week for 28 days.  Dispense: 2 mL; Refill: 0      Return in about 3 weeks (around 08/17/2022) for For Weight Mangement with Dr. Gerarda Fraction.Marland Kitchen She was informed of the importance of frequent follow up visits to maximize her success with intensive lifestyle modifications for her multiple health conditions.   ATTESTASTION STATEMENTS:  Reviewed by clinician on day of visit: allergies, medications, problem list, medical history, surgical history, family history, social history, and previous encounter notes.   Time spent on visit including pre-visit chart review and post-visit care and charting was 30 minutes.    Thomes Dinning, MD

## 2022-07-27 NOTE — Assessment & Plan Note (Signed)
Her HOMA-IR is 8 which is significant insulin resistance. Optimal level < 1.9. This is complex condition associated with genetics, ectopic fat and lifestyle factors. Insulin resistance may result in weight gain, abnormal cravings (particularly for carbs) and fatigue. This may result in additional weight gain and lead to pre-diabetes and diabetes if untreated.   Lab Results  Component Value Date   HGBA1C 5.3 06/29/2022   Lab Results  Component Value Date   INSULIN 32.5 (H) 06/29/2022   Lab Results  Component Value Date   GLUCOSE 92 06/29/2022   GLUCOSE 101 (H) 03/22/2016    I recommend ongoing weight loss therapy (10% of TBW), increasing physical activity, reducing processed, simple sugars and saturated fats in diet.  She also benefits from pharmacotherapy with incretin mimetic.

## 2022-07-28 ENCOUNTER — Other Ambulatory Visit (HOSPITAL_COMMUNITY): Payer: Self-pay

## 2022-07-31 ENCOUNTER — Other Ambulatory Visit: Payer: Self-pay | Admitting: Family Medicine

## 2022-07-31 DIAGNOSIS — E039 Hypothyroidism, unspecified: Secondary | ICD-10-CM

## 2022-08-01 ENCOUNTER — Telehealth (INDEPENDENT_AMBULATORY_CARE_PROVIDER_SITE_OTHER): Payer: Self-pay | Admitting: Internal Medicine

## 2022-08-01 ENCOUNTER — Telehealth: Payer: Self-pay | Admitting: Family Medicine

## 2022-08-01 DIAGNOSIS — N951 Menopausal and female climacteric states: Secondary | ICD-10-CM

## 2022-08-01 MED ORDER — ESTRADIOL 0.075 MG/24HR TD PTTW: MEDICATED_PATCH | TRANSDERMAL | 3 refills | Status: DC

## 2022-08-01 NOTE — Telephone Encounter (Signed)
Caller Name: [place] Call back phone #: [phone]  Reason for Call: Pt needs some clarification on her med patches she stated that the dose was lowered. Please call

## 2022-08-01 NOTE — Telephone Encounter (Signed)
Patient called 08/01/22 changed  insurance to [facility] Preferred.She spoke with the Pharmacy on getting her prescription fill for [facility].Was told needed a Authorization use the [employer] on [facility]

## 2022-08-01 NOTE — Telephone Encounter (Signed)
Left message for  patient will complete PA and notify of results

## 2022-08-01 NOTE — Telephone Encounter (Signed)
Patient wants to know why the change in dose and needs the 0,75 dose. Please send to pharmacy. Dm/cma

## 2022-08-02 NOTE — Telephone Encounter (Signed)
Patient notified VIA notified phone. Dm/cma

## 2022-08-05 ENCOUNTER — Telehealth: Payer: Self-pay | Admitting: Family Medicine

## 2022-08-05 DIAGNOSIS — I10 Essential (primary) hypertension: Secondary | ICD-10-CM

## 2022-08-08 ENCOUNTER — Telehealth: Payer: Self-pay | Admitting: Family Medicine

## 2022-08-08 MED ORDER — LISINOPRIL 40 MG PO TABS: 40.0000 mg | ORAL_TABLET | Freq: Two times a day (BID) | ORAL | 1 refills | Status: DC

## 2022-08-08 NOTE — Telephone Encounter (Signed)
Mardene Celeste from [place] called to confirm if the PA was sent. She stated the patient confirmed to her that it was sent but they do not see it. Please give Mardene Celeste a call back at [phone]

## 2022-08-08 NOTE — Telephone Encounter (Signed)
PA for Vivelle-Dot patch submitted through cover my meds. Awaiting response.  Dm/cma   Key: [id number]

## 2022-08-08 NOTE — Telephone Encounter (Signed)
Pt called in with [employer] rep, Mardene Celeste, on the line to check status of prior auth request for  estradiol (VIVELLE-DOT) 0.075 MG/24HR   Please call pt to advise.  Ph # [phone]

## 2022-08-08 NOTE — Telephone Encounter (Signed)
Phone number not a valid number

## 2022-08-08 NOTE — Telephone Encounter (Signed)
RX sent to the pharmacy. Dm/cma

## 2022-08-10 ENCOUNTER — Telehealth (INDEPENDENT_AMBULATORY_CARE_PROVIDER_SITE_OTHER): Payer: No Typology Code available for payment source | Admitting: Internal Medicine

## 2022-08-10 NOTE — Telephone Encounter (Signed)
PA still pending.  Dm/cma

## 2022-08-10 NOTE — Telephone Encounter (Signed)
PA for [facility] started

## 2022-08-10 NOTE — Telephone Encounter (Signed)
Shannon Reyes! I called to verify the [place] number. It is a valid number. Dial 91 then the number listed

## 2022-08-10 NOTE — Telephone Encounter (Signed)
Patient needs a PA for [facility]. She is also having a hard time finding Wegovy at the pharmacies. Pt would like a call back concerning both the PA for [facility] and where to find the medication.      [facility].

## 2022-08-10 NOTE — Telephone Encounter (Signed)
Spoke with Pt will call for PA and she will discuss alternative medication with Dr. Gerarda Fraction at next [place] (can not find wegovy)

## 2022-08-11 ENCOUNTER — Telehealth (INDEPENDENT_AMBULATORY_CARE_PROVIDER_SITE_OTHER): Payer: Self-pay | Admitting: Internal Medicine

## 2022-08-11 NOTE — Telephone Encounter (Signed)
Patient Advocate Encounter  Received a fax from [place] regarding Prior Authorization for Vivelle-Dot 0.'025MG'$ /24HR biweekly patches.   Authorization has been DENIED due to   Determination letter attached to patient chart

## 2022-08-11 NOTE — Telephone Encounter (Signed)
PA still pending.  Dm/cma

## 2022-08-11 NOTE — Telephone Encounter (Signed)
2/29 Patient is calling for wegovy. Patient stated that her insurance denied the prior authozation and whats to know what to do next. Ardeen Fillers

## 2022-08-11 NOTE — Telephone Encounter (Signed)
I spoke with patient yesterday and told her it would be denied and to discuss alternative medication with Dr. Gerarda Fraction at next office visit

## 2022-08-11 NOTE — Telephone Encounter (Signed)
PA denied, needs 6 months of progam

## 2022-08-12 NOTE — Telephone Encounter (Signed)
Will discuss with provider at appointment on 08/15/22.  Dm/cma

## 2022-08-15 ENCOUNTER — Ambulatory Visit: Payer: No Typology Code available for payment source | Admitting: Family Medicine

## 2022-08-15 VITALS — BP 132/80 | HR 67 | Temp 98.4°F | Ht 63.0 in | Wt 232.0 lb

## 2022-08-15 DIAGNOSIS — I10 Essential (primary) hypertension: Secondary | ICD-10-CM

## 2022-08-15 DIAGNOSIS — N951 Menopausal and female climacteric states: Secondary | ICD-10-CM | POA: Diagnosis not present

## 2022-08-15 DIAGNOSIS — E039 Hypothyroidism, unspecified: Secondary | ICD-10-CM

## 2022-08-15 DIAGNOSIS — Z6841 Body Mass Index (BMI) 40.0 and over, adult: Secondary | ICD-10-CM

## 2022-08-15 MED ORDER — ESTRADIOL 1 MG PO TABS: 1.0000 mg | ORAL_TABLET | Freq: Every day | ORAL | 3 refills | Status: DC

## 2022-08-15 NOTE — Progress Notes (Signed)
[facility] [address] [place] [address] Dept: [phone] Dept Fax: [phone]  Chronic Care Office Visit  Subjective:    Patient ID: Shannon Reyes, female    DOB: 12-28-1950, 72 y.o..   MRN: [id number]  Chief Complaint  Patient presents with   Medical Management of Chronic Issues    3 month f/u.  Discuss options for estradiol patch that was denied.    History of Present Illness:  Patient is in today for reassessment of chronic medical issues.  Shannon Reyes has a history of essential hypertension. She was being managed on chlorthalidone 25 mg daily and lisinopril 40 mg daily.    Shannon Reyes has a history of hypothyroidism. She is on Armour Thyroid (thyroid extract) 135 mg daily.  She has recently stopped taking her levothyroxine as her last TSH was still low.   Shannon Reyes continues to feel frustrated by her weight. She notes she eats and overall healthy diet. She has been walking her dogs 15 min twice a day (covering about 1 mile total). She had been trying to get approved for [facility], but has had issue with unavailability of the medication and the cost.  Shannon Reyes had a hysterectomy at an early age. She has been on estrogen replacement for many years. She has most recently been on a Vivelle-Dot patch. Her insurance changed in January and now she cannot get approval for this. She uses the name brand, as she tends to have an allergic reaction to the adhesive in other patches. She was effectively managed on Estrace int he past. She prefers not to use an estrogen get.   Past Medical History: Patient Active Problem List   Diagnosis Date Noted   Class 3 severe obesity with serious comorbidity and body mass index (BMI) of 40.0 to 44.9 in adult [facility]) 07/27/2022   Insulin resistance 07/13/2022   Anxiety with depression 10/27/2021   ADD (attention deficit disorder) 10/27/2021   Hypothyroidism 07/12/2021   S/P hysterectomy with  oophorectomy 07/12/2021   Diverticulitis of sigmoid colon s/p lap colectomy 06/19/2018 06/23/2018   S/P partial colectomy 06/19/2018   Degeneration of lumbar intervertebral disc 03/30/2018   Perennial allergic rhinitis 03/15/2016   Sensorineural hearing loss (SNHL), bilateral 03/15/2016   Tinnitus, bilateral 03/15/2016   GERD (gastroesophageal reflux disease) 01/25/2013   Essential hypertension [id number]   Systolic murmur [id number]   Coronary artery fistula 12/31/2012   Past Surgical History:  Procedure Laterality Date   ABDOMINAL HYSTERECTOMY  1981   COMPLETE   ANKLE SURGERY Right 2004/2014   BLEPHAROPLASTY Left    CATARACT EXTRACTION Bilateral    LEFT OCT 2018, RIGHT NOV 2019   [place]   COLON RESECTION Left 06/19/2018   Procedure: LAPAROSCOPIC ASSISTED  LEFT COLON RESECTION;  Surgeon: Johnathan Hausen, MD;  Location: [facility];  Service: General;  Laterality: Left;  ERAS PATHWAY   CYSTOSCOPY WITH STENT PLACEMENT Bilateral 06/19/2018   Procedure: CYSTOSCOPY WITH BILATERAL URETERAL CATHETER PLACEMENT;  Surgeon: Irine Seal, MD;  Location: [facility];  Service: Urology;  Laterality: Bilateral;   OVARIAN CYST REMOVAL     Family History  Problem Relation Age of Onset   Heart disease Mother    Thyroid disease Mother    Depression Mother    Anxiety disorder Mother    Obesity Mother    Stroke Father    Hypertension Father    Heart disease Father    High Cholesterol Father  Parkinson's disease Maternal Grandmother    Cancer Paternal Grandmother        Pancreatic   Diabetes Paternal Grandfather    Diabetes Paternal Uncle    Breast cancer Neg Hx    Outpatient Medications Prior to Visit  Medication Sig Dispense Refill   buPROPion (WELLBUTRIN XL) 300 MG 24 hr tablet Take 300 mg by mouth daily.     chlorthalidone (HYGROTON) 25 MG tablet Take 1 tablet (25 mg total) by mouth daily. 90 tablet 3   dicyclomine (BENTYL) 10 MG capsule TAKE 1 CAPSULE BY MOUTH 4 TIMES A  DAY - TAKE 20 TO 30 MINUTES BEFORE MEALS AND AT BEDTIME 120 capsule 3   EVEKEO 10 MG TABS Take 10 mg by mouth daily as needed (ADHD).   0   lisinopril (ZESTRIL) 40 MG tablet Take 1 tablet (40 mg total) by mouth 2 (two) times daily. 180 tablet 1   omeprazole (PRILOSEC) 20 MG capsule Take 1 capsule (20 mg total) by mouth daily. Take 30-60 minutes before breakfast meal each day 30 capsule 5   thyroid (ARMOUR THYROID) 120 MG tablet TAKE 1 TABLET BY MOUTH DAILY BEFORE BREAKFAST 30 tablet 11   thyroid (ARMOUR THYROID) 15 MG tablet Take 1 tablet (15 mg total) by mouth daily before breakfast. Needs appointment before next refill. 90 tablet 3   traZODone (DESYREL) 50 MG tablet Take 50 mg by mouth at bedtime.     estradiol (VIVELLE-DOT) 0.075 MG/24HR estradiol 0.075 mg/24 hr semiweekly transdermal patch 8 patch 3   levothyroxine (SYNTHROID) 50 MCG tablet Take 50 mcg by mouth daily before breakfast. (Patient not taking: Reported on 08/15/2022)     Semaglutide-Weight Management 0.25 MG/0.5ML SOAJ Inject 0.25 mg into the skin once a week for 28 days. (Patient not taking: Reported on 08/15/2022) 2 mL 0   No facility-administered medications prior to visit.   Allergies  Allergen Reactions   Erythromycin Base Itching   Codeine Itching   Objective:   Today's Vitals   08/15/22 1034  BP: 132/80  Pulse: 67  Temp: 98.4 F (36.9 C)  TempSrc: Temporal  SpO2: 96%  Weight: 232 lb (105.2 kg)  Height: '5\' 3"'$  (1.6 m)   Body mass index is 41.1 kg/m.   General: Well developed, well nourished. No acute distress. Psych: Alert and oriented. Normal mood and affect.  Health Maintenance Due  Topic Date Due   Hepatitis C Screening  Never done   Zoster Vaccines- Shingrix (1 of 2) Never done     Assessment & Plan:   Problem List Items Addressed This Visit       Cardiovascular and Mediastinum   Essential hypertension - Primary    Blood pressure is in acceptable control today. I will continue chlorthalidone 25 mg  daily and lisinopril 40 mg daily.        Endocrine   Hypothyroidism    Recent changes in her doses. She should consider a repeat TSH in 2-4 weeks.        Other   Class 3 severe obesity with serious comorbidity and body mass index (BMI) of 40.0 to 44.9 in adult [facility])    Ms. Mcfield will continue to work with the [facility] Healthy Weight clinic.       Vasomotor symptoms due to menopause    We did discuss a trial off of estrogen, but she notes this typically causes emotional issues for her. I will switch her from Vivelle-Dot to Estrace daily.  Relevant Medications   estradiol (ESTRACE) 1 MG tablet    Return in about 3 months (around 11/15/2022) for Reassessment.   Haydee Salter, MD

## 2022-08-15 NOTE — Assessment & Plan Note (Signed)
Recent changes in her doses. She should consider a repeat TSH in 2-4 weeks.

## 2022-08-15 NOTE — Assessment & Plan Note (Signed)
Blood pressure is in acceptable control today. I will continue chlorthalidone 25 mg daily and lisinopril 40 mg daily.

## 2022-08-15 NOTE — Telephone Encounter (Signed)
Patient changed to Estradiol tablets at appointment. Dm/cma

## 2022-08-15 NOTE — Assessment & Plan Note (Signed)
Ms. Kulig will continue to work with the [facility] Healthy Weight clinic.

## 2022-08-15 NOTE — Assessment & Plan Note (Signed)
We did discuss a trial off of estrogen, but she notes this typically causes emotional issues for her. I will switch her from Vivelle-Dot to Estrace daily.

## 2022-08-18 ENCOUNTER — Encounter (INDEPENDENT_AMBULATORY_CARE_PROVIDER_SITE_OTHER): Payer: Self-pay | Admitting: Internal Medicine

## 2022-08-18 ENCOUNTER — Ambulatory Visit (INDEPENDENT_AMBULATORY_CARE_PROVIDER_SITE_OTHER): Payer: No Typology Code available for payment source | Admitting: Internal Medicine

## 2022-08-18 VITALS — BP 138/82 | HR 84 | Temp 98.1°F | Ht 63.0 in | Wt 227.0 lb

## 2022-08-18 DIAGNOSIS — Z6841 Body Mass Index (BMI) 40.0 and over, adult: Secondary | ICD-10-CM | POA: Diagnosis not present

## 2022-08-18 DIAGNOSIS — E88819 Insulin resistance, unspecified: Secondary | ICD-10-CM

## 2022-08-18 MED ORDER — METFORMIN HCL ER 500 MG PO TB24: 500.0000 mg | ORAL_TABLET | Freq: Every day | ORAL | 0 refills | Status: DC

## 2022-08-18 NOTE — Progress Notes (Signed)
Office: [phone]  /  Fax: [phone]  WEIGHT SUMMARY AND BIOMETRICS  Vitals Temp: 98.1 F (36.7 C) BP: 138/82 Pulse Rate: 84 SpO2: 95 %   Anthropometric Measurements Height: '5\' 3"'$  (1.6 m) Weight: 227 lb (103 kg) BMI (Calculated): 40.22 Weight at Last Visit: 226 lb Weight Lost Since Last Visit: +1 lb Starting Weight: 224 lb Total Weight Loss (lbs): 0 lb (0 kg) Peak Weight: 230 lb   Body Composition  Body Fat %: 49.8 % Fat Mass (lbs): 133.4 lbs Muscle Mass (lbs): 108.6 lbs Total Body Water (lbs): 79 lbs Visceral Fat Rating : 17    HPI  Chief Complaint: OBESITY  Shannon Reyes is here to discuss her progress with her obesity treatment plan. She is on the the Category 2 Plan and states she is following her eating plan approximately 90 % of the time. She states she is exercising 30 minutes 4-5 times per week.  Interval History:  Since last office visit she has gained 1 pound. She reports good adherence to reduced calorie nutritional plan. She has been working on reducing carbs, increasing protein with her meals and eating more fruits and vegetables.  She is working with her husband and is trying to maintain a low-carb style of diet.  We had prescribed Wegovy but medication was denied by her insurance. '[x]'$ Denies '[]'$ Reports problems with appetite and hunger signals.  '[x]'$ Denies '[]'$ Reports problems with satiety and satiation.  '[x]'$ Denies '[]'$ Reports problems with eating patterns and portion control.  '[]'$ Denies '[x]'$ Reports abnormal cravings Stress levels are reported as medium and due to Work.  Barriers identified lack of time for self-care.   Pharmacotherapy for weight loss: We had prescribed Wegovy but it was denied by her insurance.  Weight promoting medications identified: Psychotropic medications, Anticholinergics, and Contraceptives or hormonal therapy.  ASSESSMENT AND PLAN  TREATMENT PLAN FOR OBESITY:  Recommended Dietary Goals  Shannon Reyes is currently in the action stage  of change. As such, her goal is to continue weight management plan. She has agreed to: the Category 2 Plan.  Behavioral Intervention  We discussed the following Behavioral Modification Strategies today: increasing lean protein intake, decreasing simple carbohydrates , increasing vegetables, increasing lower glycemic fruits, increasing fiber rich foods, increasing water intake, work on meal planning and easy cooking plans, work on Interior and spatial designer calories using tracking App, reading food labels , and work on scanning foods using YUKA app to quickly assess food quality.  Additional resources provided today: Handout on healthy eating and balanced plate, Handout on complex carbohydrates and lean sources of protein, and Handout on tracking and journaling using Apps  Recommended Physical Activity Goals  Shannon Reyes has been advised to work up to 150 minutes of moderate intensity aerobic activity a week and strengthening exercises 2-3 times per week for cardiovascular health, weight loss maintenance and preservation of muscle mass.   She has agreed to :  '[]'$  Continue current level of physical activity  '[x]'$  Think about ways to increase physical activity '[x]'$  Start strengthening exercises with a goal of 2-3 sessions a week  '[]'$  Start aerobic activity with a goal of 150 minutes a week at moderate intensity.  '[]'$  Increase the intensity, frequency or duration of strengthening exercises  '[]'$  Increase the intensity, frequency or duration of aerobic exercises   '[]'$  Increase physical activity in their day and reduce sedentary time (increase NEAT). '[]'$  Work on scheduling and tracking physical activity.   Pharmacotherapy We discussed various medication options to help Shannon Reyes with her weight loss efforts and  we both agreed to : To start metformin XR 500 mg 1 tablet daily for incretin effect and insulin resistance.  ASSOCIATED CONDITIONS ADDRESSED TODAY  Insulin resistance Assessment & Plan: Her HOMA-IR is 8  which is significant insulin resistance. Optimal level < 1.9. This is complex condition associated with genetics, ectopic fat and lifestyle factors. Insulin resistance may result in weight gain, abnormal cravings (particularly for carbs) and fatigue. This may result in additional weight gain and lead to pre-diabetes and diabetes if untreated.   Lab Results  Component Value Date   HGBA1C 5.3 06/29/2022   Lab Results  Component Value Date   INSULIN 32.5 (H) 06/29/2022   Lab Results  Component Value Date   GLUCOSE 92 06/29/2022   GLUCOSE 101 (H) 03/22/2016    Her insurance denied Wegovy.  After discussion of benefits and side effects as well as off label use she will be started on metformin XR 500 mg once a day.  This could potentially be increased to 2000 mg a day.   Orders: -     metFORMIN HCl ER; Take 1 tablet (500 mg total) by mouth daily with breakfast.  Dispense: 30 tablet; Refill: 0  Class 3 severe obesity due to excess calories with serious comorbidity and body mass index (BMI) of 40.0 to 44.9 in adult [facility])     PHYSICAL EXAM:  Blood pressure 138/82, pulse 84, temperature 98.1 F (36.7 C), height '5\' 3"'$  (1.6 m), weight 227 lb (103 kg), SpO2 95 %. Body mass index is 40.21 kg/m.  General: She is overweight, cooperative, alert, well developed, and in no acute distress. PSYCH: Has normal mood, affect and thought process.   HEENT: EOMI, sclerae are anicteric. Lungs: Normal breathing effort, no conversational dyspnea. Extremities: No edema.  Neurologic: No gross sensory or motor deficits. No tremors or fasciculations noted.    DIAGNOSTIC DATA REVIEWED:  BMET    Component Value Date/Time   NA 138 06/29/2022 1050   K 3.8 06/29/2022 1050   CL 98 06/29/2022 1050   CO2 20 06/29/2022 1050   GLUCOSE 92 06/29/2022 1050   GLUCOSE 90 11/24/2021 1330   BUN 23 06/29/2022 1050   CREATININE 0.71 06/29/2022 1050   CREATININE 0.66 04/06/2016 0931   CALCIUM 10.3 06/29/2022 1050    GFRNONAA >60 06/20/2018 0419   GFRAA >60 06/20/2018 0419   Lab Results  Component Value Date   HGBA1C 5.3 06/29/2022   HGBA1C 4.9 06/15/2018   Lab Results  Component Value Date   INSULIN 32.5 (H) 06/29/2022   Lab Results  Component Value Date   TSH 0.537 06/29/2022   CBC    Component Value Date/Time   WBC 7.1 06/29/2022 1050   WBC 6.7 06/22/2018 0434   RBC 4.46 06/29/2022 1050   RBC 3.64 (L) 06/22/2018 0434   HGB 14.4 06/29/2022 1050   HCT 42.4 06/29/2022 1050   PLT 254 06/29/2022 1050   MCV 95 06/29/2022 1050   MCH 32.3 06/29/2022 1050   MCH 31.3 06/22/2018 0434   MCHC 34.0 06/29/2022 1050   MCHC 31.1 06/22/2018 0434   RDW 12.0 06/29/2022 1050   Iron Studies No results found for: "IRON", "TIBC", "FERRITIN", "IRONPCTSAT" Lipid Panel     Component Value Date/Time   CHOL 179 06/29/2022 1050   TRIG 129 06/29/2022 1050   HDL 57 06/29/2022 1050   LDLCALC 99 06/29/2022 1050   Hepatic Function Panel     Component Value Date/Time   PROT 7.2 06/29/2022 1050  ALBUMIN 4.7 06/29/2022 1050   AST 17 06/29/2022 1050   ALT 19 06/29/2022 1050   ALKPHOS 56 06/29/2022 1050   BILITOT 0.3 06/29/2022 1050      Component Value Date/Time   TSH 0.537 06/29/2022 1050   Nutritional Lab Results  Component Value Date   VD25OH 42.1 06/29/2022     Return in about 3 weeks (around 09/08/2022) for For Weight Mangement with Dr. Gerarda Fraction.Marland Kitchen She was informed of the importance of frequent follow up visits to maximize her success with intensive lifestyle modifications for her multiple health conditions.   ATTESTASTION STATEMENTS:  Reviewed by clinician on day of visit: allergies, medications, problem list, medical history, surgical history, family history, social history, and previous encounter notes.     Thomes Dinning, MD

## 2022-08-18 NOTE — Assessment & Plan Note (Signed)
Her HOMA-IR is 8 which is significant insulin resistance. Optimal level < 1.9. This is complex condition associated with genetics, ectopic fat and lifestyle factors. Insulin resistance may result in weight gain, abnormal cravings (particularly for carbs) and fatigue. This may result in additional weight gain and lead to pre-diabetes and diabetes if untreated.   Lab Results  Component Value Date   HGBA1C 5.3 06/29/2022   Lab Results  Component Value Date   INSULIN 32.5 (H) 06/29/2022   Lab Results  Component Value Date   GLUCOSE 92 06/29/2022   GLUCOSE 101 (H) 03/22/2016    Her insurance denied Wegovy.  After discussion of benefits and side effects as well as off label use she will be started on metformin XR 500 mg once a day.  This could potentially be increased to 2000 mg a day.

## 2022-08-30 ENCOUNTER — Other Ambulatory Visit (INDEPENDENT_AMBULATORY_CARE_PROVIDER_SITE_OTHER): Payer: Self-pay | Admitting: Internal Medicine

## 2022-08-30 DIAGNOSIS — E88819 Insulin resistance, unspecified: Secondary | ICD-10-CM

## 2022-09-12 ENCOUNTER — Encounter (INDEPENDENT_AMBULATORY_CARE_PROVIDER_SITE_OTHER): Payer: Self-pay

## 2022-09-12 ENCOUNTER — Ambulatory Visit (INDEPENDENT_AMBULATORY_CARE_PROVIDER_SITE_OTHER): Payer: No Typology Code available for payment source | Admitting: Internal Medicine

## 2022-09-14 ENCOUNTER — Ambulatory Visit (INDEPENDENT_AMBULATORY_CARE_PROVIDER_SITE_OTHER): Payer: No Typology Code available for payment source | Admitting: Internal Medicine

## 2022-09-14 ENCOUNTER — Encounter (INDEPENDENT_AMBULATORY_CARE_PROVIDER_SITE_OTHER): Payer: Self-pay | Admitting: Internal Medicine

## 2022-09-14 VITALS — BP 123/81 | HR 83 | Temp 98.1°F | Ht 63.0 in | Wt 222.0 lb

## 2022-09-14 DIAGNOSIS — Z6841 Body Mass Index (BMI) 40.0 and over, adult: Secondary | ICD-10-CM

## 2022-09-14 DIAGNOSIS — I10 Essential (primary) hypertension: Secondary | ICD-10-CM | POA: Diagnosis not present

## 2022-09-14 DIAGNOSIS — E88819 Insulin resistance, unspecified: Secondary | ICD-10-CM | POA: Diagnosis not present

## 2022-09-14 MED ORDER — METFORMIN HCL ER 500 MG PO TB24: 500.0000 mg | ORAL_TABLET | Freq: Every day | ORAL | 0 refills | Status: DC

## 2022-09-14 NOTE — Progress Notes (Signed)
Office: [phone]  /  Fax: [phone]  WEIGHT SUMMARY AND BIOMETRICS  Vitals Temp: 98.1 F (36.7 C) BP: 123/81 Pulse Rate: 83 SpO2: 99 %   Anthropometric Measurements Height: 5\' 3"  (1.6 m) Weight: 222 lb (100.7 kg) BMI (Calculated): 39.34 Weight at Last Visit: 227 lb Weight Lost Since Last Visit: 5 lb Starting Weight: 224 lb Total Weight Loss (lbs): 2 lb (0.907 kg) Peak Weight: 230 lb   Body Composition  Body Fat %: 49.6 % Fat Mass (lbs): 110.2 lbs Muscle Mass (lbs): 106.4 lbs Total Body Water (lbs): 79.4 lbs Visceral Fat Rating : 17    HPI  Chief Complaint: OBESITY  Shannon Reyes is here to discuss her progress with her obesity treatment plan. She is on the keeping a food journal and adhering to recommended goals of 1200 calories and 90 protein and states she is following her eating plan approximately 85-90 % of the time. She states she is exercising 15-20 minutes 2 times per week.  Interval History:  Since last office visit she has lost 5 lbs. She reports good adherence to reduced calorie nutritional plan. She has been working on not skipping meals, increasing protein at every meal, drinking more water, and making healthier choices. Has noticed decreased and cravings and appetite since starting metformin.  She also feels like she has more energy.  This was started because she has insulin resistance. Denies problems with appetite and hunger signals.  Denies problems with satiety and satiation.  Denies problems with eating patterns and portion control.  Denies abnormal cravings. Denies feeling deprived or restricted.   Barriers identified: none.   Pharmacotherapy for weight loss: She is currently taking Metformin (off label use for incretin effect and / or insulin resistance and / or diabetes prevention) .    ASSESSMENT AND PLAN  TREATMENT PLAN FOR OBESITY:  Recommended Dietary Goals  Shannon Reyes is currently in the action stage of change. As such, her goal is to  continue weight management plan. She has agreed to: continue current plan  Behavioral Intervention  We discussed the following Behavioral Modification Strategies today: increasing lean protein intake, avoiding skipping meals, increasing water intake, work on meal planning and preparation, and reading food labels .  Additional resources provided today: None  Recommended Physical Activity Goals  Shannon Reyes has been advised to work up to 150 minutes of moderate intensity aerobic activity a week and strengthening exercises 2-3 times per week for cardiovascular health, weight loss maintenance and preservation of muscle mass.   She has agreed to :  Think about ways to increase physical activity. Will start doing yard work and more walking.  Pharmacotherapy We discussed various medication options to help Shannon Reyes with her weight loss efforts and we both agreed to : continue current anti-obesity medication regimen  ASSOCIATED CONDITIONS ADDRESSED TODAY  Class 3 severe obesity due to excess calories with serious comorbidity and body mass index (BMI) of 40.0 to 44.9 in adult  Insulin resistance Assessment & Plan: Her HOMA-IR is 8 which is significant insulin resistance. Optimal level < 1.9. This is complex condition associated with genetics, ectopic fat and lifestyle factors. Insulin resistance may result in weight gain, abnormal cravings (particularly for carbs) and fatigue. This may result in additional weight gain and lead to pre-diabetes and diabetes if untreated.   Lab Results  Component Value Date   HGBA1C 5.3 06/29/2022   Lab Results  Component Value Date   INSULIN 32.5 (H) 06/29/2022   Lab Results  Component Value Date  GLUCOSE 92 06/29/2022   GLUCOSE 101 (H) 03/22/2016    Her insurance denied Wegovy.  She is currently on metformin XR 500 mg 1 tablet daily with good results.   Orders: -     metFORMIN HCl ER; Take 1 tablet (500 mg total) by mouth daily with breakfast.  Dispense: 30  tablet; Refill: 0  Essential hypertension Assessment & Plan: Blood pressure has improved.  On chlorthalidone and lisinopril.   Continue with weight loss therapy.  Monitor for symptoms of orthostasis while losing weight. Continue current regimen.  Advised to check blood pressure in the morning and also before bedtime.        PHYSICAL EXAM:  Blood pressure 123/81, pulse 83, temperature 98.1 F (36.7 C), height 5\' 3"  (1.6 m), weight 222 lb (100.7 kg), SpO2 99 %. Body mass index is 39.33 kg/m.  General: She is overweight, cooperative, alert, well developed, and in no acute distress. PSYCH: Has normal mood, affect and thought process.   HEENT: EOMI, sclerae are anicteric. Lungs: Normal breathing effort, no conversational dyspnea. Extremities: No edema.  Neurologic: No gross sensory or motor deficits. No tremors or fasciculations noted.    DIAGNOSTIC DATA REVIEWED:  BMET    Component Value Date/Time   NA 138 06/29/2022 1050   K 3.8 06/29/2022 1050   CL 98 06/29/2022 1050   CO2 20 06/29/2022 1050   GLUCOSE 92 06/29/2022 1050   GLUCOSE 90 11/24/2021 1330   BUN 23 06/29/2022 1050   CREATININE 0.71 06/29/2022 1050   CREATININE 0.66 04/06/2016 0931   CALCIUM 10.3 06/29/2022 1050   GFRNONAA >60 06/20/2018 0419   GFRAA >60 06/20/2018 0419   Lab Results  Component Value Date   HGBA1C 5.3 06/29/2022   HGBA1C 4.9 06/15/2018   Lab Results  Component Value Date   INSULIN 32.5 (H) 06/29/2022   Lab Results  Component Value Date   TSH 0.537 06/29/2022   CBC    Component Value Date/Time   WBC 7.1 06/29/2022 1050   WBC 6.7 06/22/2018 0434   RBC 4.46 06/29/2022 1050   RBC 3.64 (L) 06/22/2018 0434   HGB 14.4 06/29/2022 1050   HCT 42.4 06/29/2022 1050   PLT 254 06/29/2022 1050   MCV 95 06/29/2022 1050   MCH 32.3 06/29/2022 1050   MCH 31.3 06/22/2018 0434   MCHC 34.0 06/29/2022 1050   MCHC 31.1 06/22/2018 0434   RDW 12.0 06/29/2022 1050   Iron Studies No results found  for: "IRON", "TIBC", "FERRITIN", "IRONPCTSAT" Lipid Panel     Component Value Date/Time   CHOL 179 06/29/2022 1050   TRIG 129 06/29/2022 1050   HDL 57 06/29/2022 1050   LDLCALC 99 06/29/2022 1050   Hepatic Function Panel     Component Value Date/Time   PROT 7.2 06/29/2022 1050   ALBUMIN 4.7 06/29/2022 1050   AST 17 06/29/2022 1050   ALT 19 06/29/2022 1050   ALKPHOS 56 06/29/2022 1050   BILITOT 0.3 06/29/2022 1050      Component Value Date/Time   TSH 0.537 06/29/2022 1050   Nutritional Lab Results  Component Value Date   VD25OH 42.1 06/29/2022     Return in about 4 weeks (around 10/12/2022) for For Weight Mangement with Dr. Gerarda Fraction.Marland Kitchen She was informed of the importance of frequent follow up visits to maximize her success with intensive lifestyle modifications for her multiple health conditions.   ATTESTASTION STATEMENTS:  Reviewed by clinician on day of visit: allergies, medications, problem list, medical history, surgical  history, family history, social history, and previous encounter notes.     Thomes Dinning, MD

## 2022-09-14 NOTE — Assessment & Plan Note (Signed)
Blood pressure has improved.  On chlorthalidone and lisinopril.   Continue with weight loss therapy.  Monitor for symptoms of orthostasis while losing weight. Continue current regimen.  Advised to check blood pressure in the morning and also before bedtime.

## 2022-09-14 NOTE — Assessment & Plan Note (Signed)
Her HOMA-IR is 8 which is significant insulin resistance. Optimal level < 1.9. This is complex condition associated with genetics, ectopic fat and lifestyle factors. Insulin resistance may result in weight gain, abnormal cravings (particularly for carbs) and fatigue. This may result in additional weight gain and lead to pre-diabetes and diabetes if untreated.   Lab Results  Component Value Date   HGBA1C 5.3 06/29/2022   Lab Results  Component Value Date   INSULIN 32.5 (H) 06/29/2022   Lab Results  Component Value Date   GLUCOSE 92 06/29/2022   GLUCOSE 101 (H) 03/22/2016    Her insurance denied Wegovy.  She is currently on metformin XR 500 mg 1 tablet daily with good results.

## 2022-10-02 ENCOUNTER — Other Ambulatory Visit: Payer: Self-pay | Admitting: Family Medicine

## 2022-10-02 ENCOUNTER — Other Ambulatory Visit (INDEPENDENT_AMBULATORY_CARE_PROVIDER_SITE_OTHER): Payer: Self-pay | Admitting: Internal Medicine

## 2022-10-02 ENCOUNTER — Other Ambulatory Visit: Payer: Self-pay | Admitting: Physician Assistant

## 2022-10-02 DIAGNOSIS — I10 Essential (primary) hypertension: Secondary | ICD-10-CM

## 2022-10-02 DIAGNOSIS — E88819 Insulin resistance, unspecified: Secondary | ICD-10-CM

## 2022-10-13 ENCOUNTER — Encounter (INDEPENDENT_AMBULATORY_CARE_PROVIDER_SITE_OTHER): Payer: Self-pay | Admitting: Internal Medicine

## 2022-10-13 ENCOUNTER — Ambulatory Visit (INDEPENDENT_AMBULATORY_CARE_PROVIDER_SITE_OTHER): Payer: No Typology Code available for payment source | Admitting: Internal Medicine

## 2022-10-13 VITALS — BP 146/84 | HR 77 | Temp 97.7°F | Ht 63.0 in | Wt 223.0 lb

## 2022-10-13 DIAGNOSIS — Z6841 Body Mass Index (BMI) 40.0 and over, adult: Secondary | ICD-10-CM

## 2022-10-13 DIAGNOSIS — E88819 Insulin resistance, unspecified: Secondary | ICD-10-CM | POA: Diagnosis not present

## 2022-10-13 MED ORDER — METFORMIN HCL ER 500 MG PO TB24: 500.0000 mg | ORAL_TABLET | Freq: Two times a day (BID) | ORAL | 0 refills | Status: DC

## 2022-10-13 NOTE — Assessment & Plan Note (Signed)
Her HOMA-IR is 8 which is significant insulin resistance. Optimal level < 1.9. This is complex condition associated with genetics, ectopic fat and lifestyle factors. Insulin resistance may result in weight gain, abnormal cravings (particularly for carbs) and fatigue. This may result in additional weight gain and lead to pre-diabetes and diabetes if untreated.   Lab Results  Component Value Date   HGBA1C 5.3 06/29/2022   Lab Results  Component Value Date   INSULIN 32.5 (H) 06/29/2022   Lab Results  Component Value Date   GLUCOSE 92 06/29/2022   GLUCOSE 101 (H) 03/22/2016    Insurance does not cover incretin therapy.  She is currently on metformin XR 500 mg 1 tablet daily without any adverse effects we will increase medication to twice a day.

## 2022-10-13 NOTE — Progress Notes (Signed)
Office: [phone]  /  Fax: [phone]  WEIGHT SUMMARY AND BIOMETRICS  Vitals Temp: 97.7 F (36.5 C) BP: (!) 146/84 Pulse Rate: 77 SpO2: 99 %   Anthropometric Measurements Height: 5\' 3"  (1.6 m) Weight: 223 lb (101.2 kg) BMI (Calculated): 39.51 Weight at Last Visit: 222 lb Weight Gained Since Last Visit: 1 lb Starting Weight: 224 lb Total Weight Loss (lbs): 1 lb (0.454 kg) Peak Weight: 230 lb   Body Composition  Body Fat %: 49.9 % Fat Mass (lbs): 111.4 lbs Muscle Mass (lbs): 106.2 lbs Total Body Water (lbs): 79.6 lbs Visceral Fat Rating : 17    No data recorded Today's Visit #: 6  Starting Date: 06/29/22   HPI  Chief Complaint: OBESITY  Shannon Reyes is here to discuss her progress with her obesity treatment plan. She is on the following a lower carbohydrate, vegetable and lean protein rich diet plan and states she is following her eating plan approximately 90 % of the time. She states she is exercising 15-20 minutes 4 times per week.  Interval History:  Since last office visit she has gained 1 pound.  She has been under a lot of stress over the last few weeks because of estranged son.  He has returned to his home in [place] and patient is now working on getting back on track.  She has purchased a walking pad and has put her in her home office and is planning on using it 2-3 times a day for about 15 minutes for starters.  She is making healthy food choices but notes problems with hunger signals and cravings.  We have started metformin a few visits ago and she has found medication helpful but still has some cravings later in the day.  She denies side effects to the medication.    Barriers identified: cost of medication and strong hunger signals and appetite.   Pharmacotherapy for weight loss: She is currently taking Metformin (off label use for incretin effect and / or insulin resistance and / or diabetes prevention) .    ASSESSMENT AND PLAN  TREATMENT PLAN FOR  OBESITY:  Recommended Dietary Goals  Shannon Reyes is currently in the action stage of change. As such, her goal is to continue weight management plan. She has agreed to: continue current plan  Behavioral Intervention  We discussed the following Behavioral Modification Strategies today: increasing lean protein intake, decreasing simple carbohydrates , increasing vegetables, increasing lower glycemic fruits, increasing water intake, work on managing stress, creating time for self-care and relaxation measures, continue to practice mindfulness when eating, and planning for success.  Additional resources provided today: None  Recommended Physical Activity Goals  Shannon Reyes has been advised to work up to 150 minutes of moderate intensity aerobic activity a week and strengthening exercises 2-3 times per week for cardiovascular health, weight loss maintenance and preservation of muscle mass.   She has agreed to :  Think about ways to increase physical activity  Pharmacotherapy We discussed various medication options to help Shannon Reyes with her weight loss efforts and we both agreed to :  Increase metformin XR to 500 mg twice daily.  Next option would be adding topiramate.  GLP-1 therapy is cost prohibitive at present time.  ASSOCIATED CONDITIONS ADDRESSED TODAY  Class 3 severe obesity due to excess calories with serious comorbidity and body mass index (BMI) of 40.0 to 44.9 in adult [facility])  Insulin resistance Assessment & Plan: Her HOMA-IR is 8 which is significant insulin resistance. Optimal level < 1.9. This  is complex condition associated with genetics, ectopic fat and lifestyle factors. Insulin resistance may result in weight gain, abnormal cravings (particularly for carbs) and fatigue. This may result in additional weight gain and lead to pre-diabetes and diabetes if untreated.   Lab Results  Component Value Date   HGBA1C 5.3 06/29/2022   Lab Results  Component Value Date   INSULIN 32.5 (H) 06/29/2022    Lab Results  Component Value Date   GLUCOSE 92 06/29/2022   GLUCOSE 101 (H) 03/22/2016    Insurance does not cover incretin therapy.  She is currently on metformin XR 500 mg 1 tablet daily without any adverse effects we will increase medication to twice a day.   Orders: -     metFORMIN HCl ER; Take 1 tablet (500 mg total) by mouth 2 (two) times daily with a meal.  Dispense: 60 tablet; Refill: 0    PHYSICAL EXAM:  Blood pressure (!) 146/84, pulse 77, temperature 97.7 F (36.5 C), height 5\' 3"  (1.6 m), weight 223 lb (101.2 kg), SpO2 99 %. Body mass index is 39.5 kg/m.  General: She is overweight, cooperative, alert, well developed, and in no acute distress. PSYCH: Has normal mood, affect and thought process.   HEENT: EOMI, sclerae are anicteric. Lungs: Normal breathing effort, no conversational dyspnea. Extremities: No edema.  Neurologic: No gross sensory or motor deficits. No tremors or fasciculations noted.    DIAGNOSTIC DATA REVIEWED:  BMET    Component Value Date/Time   NA 138 06/29/2022 1050   K 3.8 06/29/2022 1050   CL 98 06/29/2022 1050   CO2 20 06/29/2022 1050   GLUCOSE 92 06/29/2022 1050   GLUCOSE 90 11/24/2021 1330   BUN 23 06/29/2022 1050   CREATININE 0.71 06/29/2022 1050   CREATININE 0.66 04/06/2016 0931   CALCIUM 10.3 06/29/2022 1050   GFRNONAA >60 06/20/2018 0419   GFRAA >60 06/20/2018 0419   Lab Results  Component Value Date   HGBA1C 5.3 06/29/2022   HGBA1C 4.9 06/15/2018   Lab Results  Component Value Date   INSULIN 32.5 (H) 06/29/2022   Lab Results  Component Value Date   TSH 0.537 06/29/2022   CBC    Component Value Date/Time   WBC 7.1 06/29/2022 1050   WBC 6.7 06/22/2018 0434   RBC 4.46 06/29/2022 1050   RBC 3.64 (L) 06/22/2018 0434   HGB 14.4 06/29/2022 1050   HCT 42.4 06/29/2022 1050   PLT 254 06/29/2022 1050   MCV 95 06/29/2022 1050   MCH 32.3 06/29/2022 1050   MCH 31.3 06/22/2018 0434   MCHC 34.0 06/29/2022 1050   MCHC  31.1 06/22/2018 0434   RDW 12.0 06/29/2022 1050   Iron Studies No results found for: "IRON", "TIBC", "FERRITIN", "IRONPCTSAT" Lipid Panel     Component Value Date/Time   CHOL 179 06/29/2022 1050   TRIG 129 06/29/2022 1050   HDL 57 06/29/2022 1050   LDLCALC 99 06/29/2022 1050   Hepatic Function Panel     Component Value Date/Time   PROT 7.2 06/29/2022 1050   ALBUMIN 4.7 06/29/2022 1050   AST 17 06/29/2022 1050   ALT 19 06/29/2022 1050   ALKPHOS 56 06/29/2022 1050   BILITOT 0.3 06/29/2022 1050      Component Value Date/Time   TSH 0.537 06/29/2022 1050   Nutritional Lab Results  Component Value Date   VD25OH 42.1 06/29/2022     Return in about 3 weeks (around 11/03/2022) for For Weight Mangement with Dr. Rikki Spearing.Marland Kitchen She  was informed of the importance of frequent follow up visits to maximize her success with intensive lifestyle modifications for her multiple health conditions.   ATTESTASTION STATEMENTS:  Reviewed by clinician on day of visit: allergies, medications, problem list, medical history, surgical history, family history, social history, and previous encounter notes.     Worthy Rancher, MD

## 2022-11-01 ENCOUNTER — Other Ambulatory Visit (INDEPENDENT_AMBULATORY_CARE_PROVIDER_SITE_OTHER): Payer: Self-pay | Admitting: Internal Medicine

## 2022-11-01 DIAGNOSIS — E88819 Insulin resistance, unspecified: Secondary | ICD-10-CM

## 2022-11-08 ENCOUNTER — Encounter (INDEPENDENT_AMBULATORY_CARE_PROVIDER_SITE_OTHER): Payer: Self-pay | Admitting: Internal Medicine

## 2022-11-08 ENCOUNTER — Ambulatory Visit (INDEPENDENT_AMBULATORY_CARE_PROVIDER_SITE_OTHER): Payer: No Typology Code available for payment source | Admitting: Internal Medicine

## 2022-11-08 VITALS — BP 116/78 | HR 82 | Temp 98.1°F | Ht 63.0 in | Wt 222.0 lb

## 2022-11-08 DIAGNOSIS — Z6839 Body mass index (BMI) 39.0-39.9, adult: Secondary | ICD-10-CM | POA: Diagnosis not present

## 2022-11-08 DIAGNOSIS — I10 Essential (primary) hypertension: Secondary | ICD-10-CM | POA: Diagnosis not present

## 2022-11-08 DIAGNOSIS — E88819 Insulin resistance, unspecified: Secondary | ICD-10-CM

## 2022-11-08 MED ORDER — TOPIRAMATE 25 MG PO TABS
25.0000 mg | ORAL_TABLET | Freq: Every evening | ORAL | 0 refills | Status: DC
Start: 2022-11-08 — End: 2022-11-30

## 2022-11-08 NOTE — Progress Notes (Signed)
Office: [phone]  /  Fax: [phone]  WEIGHT SUMMARY AND BIOMETRICS  Vitals Temp: 98.1 F (36.7 C) BP: 116/78 Pulse Rate: 82 SpO2: 99 %   Anthropometric Measurements Height: 5\' 3"  (1.6 m) Weight: 222 lb (100.7 kg) BMI (Calculated): 39.34 Weight at Last Visit: 223 lb Weight Lost Since Last Visit: 1 lb Weight Gained Since Last Visit: 0 Starting Weight: 224 lb Total Weight Loss (lbs): 1 lb (0.454 kg) Peak Weight: 230 lb   Body Composition  Body Fat %: 49.5 % Fat Mass (lbs): 110 lbs Muscle Mass (lbs): 106.8 lbs Total Body Water (lbs): 80.4 lbs Visceral Fat Rating : 17    No data recorded Today's Visit #: 7  Starting Date: 06/29/22   HPI  Chief Complaint: OBESITY  Interval History:  Since last office visit she has [x]  lost []  maintained [] gained weight. She reports doing her best and making healthy choices and eating 3 meals a day increasing fruits and vegetables.  She has also begun to walk on her walking pad and is doing 15 minutes 7 days a week and is also thinking about strengthening exercises.  Last office visit we had increased her metformin to twice a day but she did not notice any significant changes in appetite or sense of fullness on the higher dose of medication.  She feels like medication effect has worn off.  She has insulin resistance.  Adherence to nutrition plan :  []  Excellent []  Good [x]  Fair []  Suboptimal []  Variable []  Gradual implementation [] Has not started implementation  Nutritional: She has been working on reading food labels, not skipping meals, increasing protein intake at every meal, eating more fruits, eating more vegetables, drinking more water, and making healthier choices  Orexigenic Control: [] Denies [x] Reports problems with appetite and hunger signals.  [] Denies [x] Reports problems with satiety and satiation.  [x] Denies [] Reports problems with eating patterns and portion control.  [x] Denies [] Reports strong cravings for  highly palatable foods [x] Denies [] Reports problems with feeling restricted or deprived  Stress levels: []  Low [x] Medium [] High   Barriers identified: multiple competing priorities and work schedule.   Pharmacotherapy for weight loss: She is currently taking Metformin (off label use for incretin effect and / or insulin resistance and / or diabetes prevention) .    ASSESSMENT AND PLAN  TREATMENT PLAN FOR OBESITY:  Recommended Dietary Goals  Shannon Reyes is currently in the action stage of change. As such, her goal is to continue weight management plan. She has agreed to: continue current plan  Behavioral Intervention  We discussed the following Behavioral Modification Strategies today: increasing lean protein intake, decreasing simple carbohydrates , increasing vegetables, increasing lower glycemic fruits, increasing water intake, continue to practice mindfulness when eating, and planning for success.  Additional resources provided today: None  Recommended Physical Activity Goals  Shannon Reyes has been advised to work up to 150 minutes of moderate intensity aerobic activity a week and strengthening exercises 2-3 times per week for cardiovascular health, weight loss maintenance and preservation of muscle mass.   She has agreed to :  Start strengthening exercises with a goal of 2-3 sessions a week   Pharmacotherapy We have discussed various medication options to help Shannon Reyes with her weight loss efforts and we both agreed to :  We will reduce metformin to 1 tablet in the morning as she did not notice an improvement in appetite or sense of fullness with twice a day dosing.  After discussion of benefits and side effects she will be started  on topiramate 25 mg 1 tablet in the evening.  Reginal Lutes will be covered sometime in June as she has to be 6 months in the program to qualify per insurance.  ASSOCIATED CONDITIONS ADDRESSED TODAY  Class 2 severe obesity with serious comorbidity and body mass index  (BMI) of 39.0 to 39.9 in adult, unspecified obesity type [facility]) Assessment & Plan: Patient has been following reduced calorie nutrition plan and has also begun increased physical activity.  It has been very difficult for her to lose weight.  I feel she would benefit from pharmacotherapy.  She has tried metformin with limited response, she is on stimulants for other conditions so we cannot use phentermine.  Reginal Lutes is covered by her insurance but there is a waiting period of 6 months.  After discussion of benefits and side effects she will be started on topiramate 25 mg 1 tablet in the evening.  Orders: -     Topiramate; Take 1 tablet (25 mg total) by mouth every evening.  Dispense: 30 tablet; Refill: 0  Insulin resistance Assessment & Plan: Her HOMA-IR is 8 which is significant insulin resistance. Optimal level < 1.9. This is complex condition associated with genetics, ectopic fat and lifestyle factors. Insulin resistance may result in weight gain, abnormal cravings (particularly for carbs) and fatigue. This may result in additional weight gain and lead to pre-diabetes and diabetes if untreated.   Lab Results  Component Value Date   HGBA1C 5.3 06/29/2022   Lab Results  Component Value Date   INSULIN 32.5 (H) 06/29/2022   Lab Results  Component Value Date   GLUCOSE 92 06/29/2022   GLUCOSE 101 (H) 03/22/2016   She has not noticed a reduction in appetite or cravings with a higher dose of metformin.  We will reduce medication to 1 tablet daily for pharmacoprophylaxis.  There is a waiting period for her to get on incretin therapy.  She will continue with reducing simple and added sugars in her diet and increasing physical activity.    Essential hypertension Assessment & Plan: Blood pressure has improved.  On chlorthalidone and lisinopril.  Most recent renal parameters are within normal limits  Continue with weight loss therapy.  Monitor for symptoms of orthostasis while losing weight.  Continue current regimen.       PHYSICAL EXAM:  Blood pressure 116/78, pulse 82, temperature 98.1 F (36.7 C), height 5\' 3"  (1.6 m), weight 222 lb (100.7 kg), SpO2 99 %. Body mass index is 39.33 kg/m.  General: She is overweight, cooperative, alert, well developed, and in no acute distress. PSYCH: Has normal mood, affect and thought process.   HEENT: EOMI, sclerae are anicteric. Lungs: Normal breathing effort, no conversational dyspnea. Extremities: No edema.  Neurologic: No gross sensory or motor deficits. No tremors or fasciculations noted.    DIAGNOSTIC DATA REVIEWED:  BMET    Component Value Date/Time   NA 138 06/29/2022 1050   K 3.8 06/29/2022 1050   CL 98 06/29/2022 1050   CO2 20 06/29/2022 1050   GLUCOSE 92 06/29/2022 1050   GLUCOSE 90 11/24/2021 1330   BUN 23 06/29/2022 1050   CREATININE 0.71 06/29/2022 1050   CREATININE 0.66 04/06/2016 0931   CALCIUM 10.3 06/29/2022 1050   GFRNONAA >60 06/20/2018 0419   GFRAA >60 06/20/2018 0419   EGFR 91 06/29/2022 1050   Lab Results  Component Value Date   HGBA1C 5.3 06/29/2022   HGBA1C 4.9 06/15/2018   Lab Results  Component Value Date   INSULIN 32.5 (  H) 06/29/2022   Lab Results  Component Value Date   TSH 0.537 06/29/2022   CBC    Component Value Date/Time   WBC 7.1 06/29/2022 1050   WBC 6.7 06/22/2018 0434   RBC 4.46 06/29/2022 1050   RBC 3.64 (L) 06/22/2018 0434   HGB 14.4 06/29/2022 1050   HCT 42.4 06/29/2022 1050   PLT 254 06/29/2022 1050   MCV 95 06/29/2022 1050   MCH 32.3 06/29/2022 1050   MCH 31.3 06/22/2018 0434   MCHC 34.0 06/29/2022 1050   MCHC 31.1 06/22/2018 0434   RDW 12.0 06/29/2022 1050   Iron Studies No results found for: "IRON", "TIBC", "FERRITIN", "IRONPCTSAT" Lipid Panel     Component Value Date/Time   CHOL 179 06/29/2022 1050   TRIG 129 06/29/2022 1050   HDL 57 06/29/2022 1050   LDLCALC 99 06/29/2022 1050   Hepatic Function Panel     Component Value Date/Time   PROT 7.2  06/29/2022 1050   ALBUMIN 4.7 06/29/2022 1050   AST 17 06/29/2022 1050   ALT 19 06/29/2022 1050   ALKPHOS 56 06/29/2022 1050   BILITOT 0.3 06/29/2022 1050      Component Value Date/Time   TSH 0.537 06/29/2022 1050   Nutritional Lab Results  Component Value Date   VD25OH 42.1 06/29/2022     Return in about 3 weeks (around 11/29/2022) for For Weight Mangement with Dr. Rikki Spearing.Marland Kitchen She was informed of the importance of frequent follow up visits to maximize her success with intensive lifestyle modifications for her multiple health conditions.   ATTESTASTION STATEMENTS:  Reviewed by clinician on day of visit: allergies, medications, problem list, medical history, surgical history, family history, social history, and previous encounter notes.     Worthy Rancher, MD

## 2022-11-08 NOTE — Assessment & Plan Note (Signed)
Her HOMA-IR is 8 which is significant insulin resistance. Optimal level < 1.9. This is complex condition associated with genetics, ectopic fat and lifestyle factors. Insulin resistance may result in weight gain, abnormal cravings (particularly for carbs) and fatigue. This may result in additional weight gain and lead to pre-diabetes and diabetes if untreated.   Lab Results  Component Value Date   HGBA1C 5.3 06/29/2022   Lab Results  Component Value Date   INSULIN 32.5 (H) 06/29/2022   Lab Results  Component Value Date   GLUCOSE 92 06/29/2022   GLUCOSE 101 (H) 03/22/2016   She has not noticed a reduction in appetite or cravings with a higher dose of metformin.  We will reduce medication to 1 tablet daily for pharmacoprophylaxis.  There is a waiting period for her to get on incretin therapy.  She will continue with reducing simple and added sugars in her diet and increasing physical activity.

## 2022-11-08 NOTE — Assessment & Plan Note (Signed)
Patient has been following reduced calorie nutrition plan and has also begun increased physical activity.  It has been very difficult for her to lose weight.  I feel she would benefit from pharmacotherapy.  She has tried metformin with limited response, she is on stimulants for other conditions so we cannot use phentermine.  Reginal Lutes is covered by her insurance but there is a waiting period of 6 months.  After discussion of benefits and side effects she will be started on topiramate 25 mg 1 tablet in the evening.

## 2022-11-08 NOTE — Assessment & Plan Note (Signed)
Blood pressure has improved.  On chlorthalidone and lisinopril.  Most recent renal parameters are within normal limits  Continue with weight loss therapy.  Monitor for symptoms of orthostasis while losing weight. Continue current regimen.

## 2022-11-15 ENCOUNTER — Ambulatory Visit: Payer: No Typology Code available for payment source | Admitting: Family Medicine

## 2022-11-21 ENCOUNTER — Ambulatory Visit: Payer: No Typology Code available for payment source | Admitting: Family Medicine

## 2022-11-21 VITALS — BP 124/76 | HR 80 | Temp 97.9°F | Ht 63.0 in | Wt 222.0 lb

## 2022-11-21 DIAGNOSIS — E039 Hypothyroidism, unspecified: Secondary | ICD-10-CM

## 2022-11-21 DIAGNOSIS — I10 Essential (primary) hypertension: Secondary | ICD-10-CM | POA: Diagnosis not present

## 2022-11-21 DIAGNOSIS — Z6839 Body mass index (BMI) 39.0-39.9, adult: Secondary | ICD-10-CM | POA: Diagnosis not present

## 2022-11-21 MED ORDER — LISINOPRIL 40 MG PO TABS: 40.0000 mg | ORAL_TABLET | Freq: Two times a day (BID) | ORAL | 3 refills | Status: DC

## 2022-11-21 NOTE — Progress Notes (Signed)
[facility] [address] [place] [address] Dept: [phone] Dept Fax: [phone]  Chronic Care Office Visit  Subjective:    Patient ID: Shannon Reyes, female    DOB: 11-18-1950, 72 y.o..   MRN: [id number]  Chief Complaint  Patient presents with   Medical Management of Chronic Issues    3 month f/u. No concerns.    History of Present Illness:  Patient is in today for reassessment of chronic medical issues.  Shannon Reyes has a history of essential hypertension. She was being managed on chlorthalidone 25 mg daily and lisinopril 40 mg twice daily.    Shannon Reyes has a history of hypothyroidism. She is on Armour Thyroid (thyroid extract) 135 mg daily.     Shannon Reyes is working with Dr. Rikki Spearing on weight management. She is currently on metformin XR 500 mg bid and topiramate 25 mg daily. She had the topiramate recently added. She feels the metformin has helped with changing her body shape, but has not had weight loss. She cannot tell the topiramate has helped. She anticipates being started on semaglutide in 1-2 weeks, as she needed to fail at other meds before this could be approved.  Past Medical History: Patient Active Problem List   Diagnosis Date Noted   Vasomotor symptoms due to menopause 08/15/2022   Class 2 severe obesity with serious comorbidity and body mass index (BMI) of 39.0 to 39.9 in adult [facility]) 07/27/2022   Insulin resistance 07/13/2022   Anxiety with depression 10/27/2021   ADD (attention deficit disorder) 10/27/2021   Hypothyroidism 07/12/2021   S/P hysterectomy with oophorectomy 07/12/2021   Diverticulitis of sigmoid colon s/p lap colectomy 06/19/2018 06/23/2018   S/P partial colectomy 06/19/2018   Degeneration of lumbar intervertebral disc 03/30/2018   Perennial allergic rhinitis 03/15/2016   Sensorineural hearing loss (SNHL), bilateral 03/15/2016   Tinnitus, bilateral 03/15/2016   GERD (gastroesophageal reflux  disease) 01/25/2013   Essential hypertension 12/31/2012   Systolic murmur 12/31/2012   Coronary artery fistula 12/31/2012   Past Surgical History:  Procedure Laterality Date   ABDOMINAL HYSTERECTOMY  1981   COMPLETE   ANKLE SURGERY Right 2004/2014   BLEPHAROPLASTY Left    CATARACT EXTRACTION Bilateral    LEFT OCT 2018, RIGHT NOV 2019   CESAREAN SECTION  1977 AND 1980   COLON RESECTION Left 06/19/2018   Procedure: LAPAROSCOPIC ASSISTED  LEFT COLON RESECTION;  Surgeon: Luretha Murphy, MD;  Location: [facility];  Service: General;  Laterality: Left;  ERAS PATHWAY   CYSTOSCOPY WITH STENT PLACEMENT Bilateral 06/19/2018   Procedure: CYSTOSCOPY WITH BILATERAL URETERAL CATHETER PLACEMENT;  Surgeon: Bjorn Pippin, MD;  Location: [facility];  Service: Urology;  Laterality: Bilateral;   OVARIAN CYST REMOVAL     Family History  Problem Relation Age of Onset   Heart disease Mother    Thyroid disease Mother    Depression Mother    Anxiety disorder Mother    Obesity Mother    Stroke Father    Hypertension Father    Heart disease Father    High Cholesterol Father    Parkinson's disease Maternal Grandmother    Cancer Paternal Grandmother        Pancreatic   Diabetes Paternal Grandfather    Diabetes Paternal Uncle    Breast cancer Neg Hx    Outpatient Medications Prior to Visit  Medication Sig Dispense Refill   buPROPion (WELLBUTRIN XL) 300 MG 24 hr tablet Take 300 mg by mouth daily.  chlorthalidone (HYGROTON) 25 MG tablet TAKE 1 TABLET BY MOUTH DAILY 90 tablet 2   dicyclomine (BENTYL) 10 MG capsule TAKE 1 CAPSULE BY MOUTH 4 TIMES A DAY - TAKE 20 TO 30 MINUTES BEFORE MEALS AND AT BEDTIME 120 capsule 3   estradiol (ESTRACE) 1 MG tablet Take 1 tablet (1 mg total) by mouth daily. 90 tablet 3   EVEKEO 10 MG TABS Take 10 mg by mouth daily as needed (ADHD).   0   metFORMIN (GLUCOPHAGE-XR) 500 MG 24 hr tablet TAKE 1 TABLET BY MOUTH TWICE A DAY WITH A MEAL 60 tablet 0   omeprazole (PRILOSEC) 20 MG  capsule TAKE 1 CAPSULE BY MOUTH DAILY - TAKE 30 TO 60 MINUTES BEFORE BREAKFAST 90 capsule 1   thyroid (ARMOUR THYROID) 120 MG tablet TAKE 1 TABLET BY MOUTH DAILY BEFORE BREAKFAST 30 tablet 11   thyroid (ARMOUR THYROID) 15 MG tablet Take 1 tablet (15 mg total) by mouth daily before breakfast. Needs appointment before next refill. 90 tablet 3   topiramate (TOPAMAX) 25 MG tablet Take 1 tablet (25 mg total) by mouth every evening. 30 tablet 0   traZODone (DESYREL) 50 MG tablet Take 50 mg by mouth at bedtime.     lisinopril (ZESTRIL) 40 MG tablet Take 1 tablet (40 mg total) by mouth 2 (two) times daily. 180 tablet 1   mirabegron ER (MYRBETRIQ) 50 MG TB24 tablet Take 50 mg by mouth daily.     No facility-administered medications prior to visit.   Allergies  Allergen Reactions   Erythromycin Base Itching   Codeine Itching   Objective:   Today's Vitals   11/21/22 1507  BP: 124/76  Pulse: 80  Temp: 97.9 F (36.6 C)  TempSrc: Temporal  SpO2: 99%  Weight: 222 lb (100.7 kg)  Height: 5\' 3"  (1.6 m)   Body mass index is 39.33 kg/m.   General: Well developed, well nourished. No acute distress. Psych: Alert and oriented. Normal mood and affect.  Health Maintenance Due  Topic Date Due   Hepatitis C Screening  Never done     Assessment & Plan:   Problem List Items Addressed This Visit       Cardiovascular and Mediastinum   Essential hypertension - Primary     Blood pressure is in good control. Continue lisinopril 40 mg bid and chlorthalidone 25 mg daily.       Relevant Medications   lisinopril (ZESTRIL) 40 MG tablet     Endocrine   Hypothyroidism    Continue Armour Thyroid 135 mg daily. I will reassess her TSH today.      Relevant Orders   TSH     Other   Class 2 severe obesity with serious comorbidity and body mass index (BMI) of 39.0 to 39.9 in adult (HCC)    Weight is stable, but not decreasing. She will continue to work with Dr. Rikki Spearing. Anticipates starting  semaglutide soon.       Return in about 3 months (around 02/21/2023) for Reassessment.   Loyola Mast, MD

## 2022-11-21 NOTE — Assessment & Plan Note (Signed)
Continue Armour Thyroid 135 mg daily. I will reassess her TSH today.

## 2022-11-21 NOTE — Assessment & Plan Note (Signed)
Blood pressure is in good control. Continue lisinopril 40 mg bid and chlorthalidone 25 mg daily.

## 2022-11-21 NOTE — Assessment & Plan Note (Signed)
Weight is stable, but not decreasing. She will continue to work with Dr. Rikki Spearing. Anticipates starting semaglutide soon.

## 2022-11-22 LAB — TSH: TSH: 1.25 u[IU]/mL (ref 0.35–5.50)

## 2022-11-29 ENCOUNTER — Ambulatory Visit (INDEPENDENT_AMBULATORY_CARE_PROVIDER_SITE_OTHER): Payer: Medicare Other | Admitting: Internal Medicine

## 2022-11-30 ENCOUNTER — Ambulatory Visit (INDEPENDENT_AMBULATORY_CARE_PROVIDER_SITE_OTHER): Payer: No Typology Code available for payment source | Admitting: Internal Medicine

## 2022-11-30 ENCOUNTER — Encounter (INDEPENDENT_AMBULATORY_CARE_PROVIDER_SITE_OTHER): Payer: Self-pay | Admitting: Internal Medicine

## 2022-11-30 ENCOUNTER — Other Ambulatory Visit (HOSPITAL_COMMUNITY): Payer: Self-pay

## 2022-11-30 VITALS — BP 134/82 | HR 82 | Temp 97.6°F | Ht 63.0 in | Wt 223.0 lb

## 2022-11-30 DIAGNOSIS — E88819 Insulin resistance, unspecified: Secondary | ICD-10-CM | POA: Diagnosis not present

## 2022-11-30 DIAGNOSIS — I10 Essential (primary) hypertension: Secondary | ICD-10-CM | POA: Diagnosis not present

## 2022-11-30 DIAGNOSIS — Z6839 Body mass index (BMI) 39.0-39.9, adult: Secondary | ICD-10-CM | POA: Diagnosis not present

## 2022-11-30 MED ORDER — METFORMIN HCL ER 500 MG PO TB24
500.0000 mg | ORAL_TABLET | Freq: Two times a day (BID) | ORAL | 0 refills | Status: DC
Start: 2022-11-30 — End: 2023-01-04
  Filled 2022-11-30: qty 60, 30d supply, fill #0

## 2022-11-30 MED ORDER — SEMAGLUTIDE-WEIGHT MANAGEMENT 0.25 MG/0.5ML ~~LOC~~ SOAJ
0.2500 mg | SUBCUTANEOUS | 0 refills | Status: AC
Start: 2022-11-30 — End: 2022-12-28
  Filled 2022-11-30 – 2023-11-21 (×3): qty 2, 28d supply, fill #0

## 2022-11-30 NOTE — Assessment & Plan Note (Signed)
Her HOMA-IR is 8 which is significant insulin resistance. Optimal level < 1.9. This is complex condition associated with genetics, ectopic fat and lifestyle factors. Insulin resistance may result in weight gain, abnormal cravings (particularly for carbs) and fatigue. This may result in additional weight gain and lead to pre-diabetes and diabetes if untreated.   Lab Results  Component Value Date   HGBA1C 5.3 06/29/2022   Lab Results  Component Value Date   INSULIN 32.5 (H) 06/29/2022   Lab Results  Component Value Date   GLUCOSE 92 06/29/2022   GLUCOSE 101 (H) 03/22/2016   We will increase metformin XR 500 mg twice a day as she felt that it was helpful but not associated with weight loss.  She will be started on incretin therapy

## 2022-11-30 NOTE — Assessment & Plan Note (Signed)
Patient has been following reduced calorie nutrition plan and has also begun increased physical activity.  It has been very difficult for her to lose weight.  I feel she would benefit from pharmacotherapy.  She has tried metformin with limited response, she is on stimulants for other conditions so we cannot use phentermine.  We recently tried topiramate and no clinical response.  I feel that she benefits from incretin therapy.  We will prescribe Wegovy 0.25 mg once a week.

## 2022-11-30 NOTE — Assessment & Plan Note (Signed)
Blood pressure has improved.  On chlorthalidone and lisinopril.  Most recent renal parameters are within normal limits  Continue with weight loss therapy.  Monitor for symptoms of orthostasis while losing weight. Continue current regimen.   

## 2022-11-30 NOTE — Progress Notes (Signed)
Office: [phone]  /  Fax: [phone]  WEIGHT SUMMARY AND BIOMETRICS  Vitals Temp: 97.6 F (36.4 C) BP: 134/82 Pulse Rate: 82 SpO2: 99 %   Anthropometric Measurements Height: 5\' 3"  (1.6 m) Weight: 223 lb (101.2 kg) BMI (Calculated): 39.51 Weight at Last Visit: 222 lb Weight Gained Since Last Visit: 1 lb Starting Weight: 224 lb Total Weight Loss (lbs): 1 lb (0.454 kg) Peak Weight: 230 lb   Body Composition  Body Fat %: 50 % Fat Mass (lbs): 111.8 lbs Muscle Mass (lbs): 106.2 lbs Total Body Water (lbs): 78.2 lbs Visceral Fat Rating : 17    No data recorded Today's Visit #: 8  Starting Date: 06/29/22   HPI  Chief Complaint: OBESITY  Shannon Reyes is here to discuss her progress with her obesity treatment plan. She is on the the Category 2 Plan and states she is following her eating plan approximately 80 % of the time. She states she is exercising 15 minutes 4 times per week.  Interval History:  Since last office visit she has gained 1 pound.  Last office visit we had started her on topiramate 25 mg in the evening she denies any side effects but also there was no effect in orixegenic control.  She states that metformin twice a day helped more with cravings and sense of fullness.  She still working full-time and has a very stressful job. She reports good adherence to reduced calorie nutritional plan. She has been working on not skipping meals, increasing protein intake at every meal, eating more fruits, eating more vegetables, drinking more water, avoiding and or reducing liquid calories, making healthier choices, begun to exercise, eating out less, and reducing portion sizes  Orixegenic Control: Reports problems with appetite and hunger signals.  Denies problems with satiety and satiation.  Reports problems with eating patterns and portion control.  Reports abnormal cravings. Denies feeling deprived or restricted.   Barriers identified: strong hunger signals and  appetite, having difficulty focusing on healthy eating, exposure to enticing environments and or relationships, cost of medication, and work schedule.   Pharmacotherapy for weight loss: She is currently taking Metformin (off label use for incretin effect and / or insulin resistance and / or diabetes prevention) with adequate clinical response  and without side effects. and Topiramate (off label use, single agent) without clinical response and without side effects..    ASSESSMENT AND PLAN  TREATMENT PLAN FOR OBESITY:  Recommended Dietary Goals  Shannon Reyes is currently in the action stage of change. As such, her goal is to continue weight management plan. She has agreed to: continue current plan, start meal replacement for lunch to further reduce calories.  Behavioral Intervention  We discussed the following Behavioral Modification Strategies today: increasing lean protein intake, decreasing simple carbohydrates , increasing vegetables, increasing lower glycemic fruits, increasing fiber rich foods, avoiding skipping meals, increasing water intake, and planning for success.  Additional resources provided today: None  Recommended Physical Activity Goals  Shannon Reyes has been advised to work up to 150 minutes of moderate intensity aerobic activity a week and strengthening exercises 2-3 times per week for cardiovascular health, weight loss maintenance and preservation of muscle mass.   She has agreed to :  Increase the intensity, frequency or duration of aerobic exercises   and Increase physical activity in their day and reduce sedentary time (increase NEAT).  Pharmacotherapy We discussed various medication options to help Shannon Reyes with her weight loss efforts and we both agreed to start incretin therapy. In  addition to reduced calorie nutrition plan (RCNP), behavioral strategies and physical activity, Shannon Reyes would benefit from pharmacotherapy to assist with hunger signals, satiety and cravings. This will  reduce obesity-related health risks by inducing weight loss, and help reduce food consumption and adherence to [facility]) . It may also improve QOL by improving self-confidence and reduce the  setbacks associated with metabolic adaptations.  She tried topiramate and is currently on metformin without weight loss.  She has contraindications to sympathomimetics.  After discussion of treatment options, mechanisms of action, benefits, side effects, contraindications and shared decision making she is agreeable to starting Wegovy 0.25 mg once a week. Patient also made aware that medication is indicated for long-term management of obesity and the risk of weight regain following discontinuation of treatment and hence the importance of adhering to medical weight loss plan.  We demonstrated use of device and patient using teach back method was able to demonstrate proper technique.  ASSOCIATED CONDITIONS ADDRESSED TODAY  Essential hypertension Assessment & Plan: Blood pressure has improved.  On chlorthalidone and lisinopril.  Most recent renal parameters are within normal limits  Continue with weight loss therapy.  Monitor for symptoms of orthostasis while losing weight. Continue current regimen.     Insulin resistance Assessment & Plan: Her HOMA-IR is 8 which is significant insulin resistance. Optimal level < 1.9. This is complex condition associated with genetics, ectopic fat and lifestyle factors. Insulin resistance may result in weight gain, abnormal cravings (particularly for carbs) and fatigue. This may result in additional weight gain and lead to pre-diabetes and diabetes if untreated.   Lab Results  Component Value Date   HGBA1C 5.3 06/29/2022   Lab Results  Component Value Date   INSULIN 32.5 (H) 06/29/2022   Lab Results  Component Value Date   GLUCOSE 92 06/29/2022   GLUCOSE 101 (H) 03/22/2016   We will increase metformin XR 500 mg twice a day as she felt that it was helpful but not  associated with weight loss.  She will be started on incretin therapy   Orders: -     metFORMIN HCl ER; Take 1 tablet (500 mg total) by mouth 2 (two) times daily with a meal.  Dispense: 60 tablet; Refill: 0  Class 2 severe obesity with serious comorbidity and body mass index (BMI) of 39.0 to 39.9 in adult, unspecified obesity type Shannon Reyes) Assessment & Plan: Patient has been following reduced calorie nutrition plan and has also begun increased physical activity.  It has been very difficult for her to lose weight.  I feel she would benefit from pharmacotherapy.  She has tried metformin with limited response, she is on stimulants for other conditions so we cannot use phentermine.  We recently tried topiramate and no clinical response.  I feel that she benefits from incretin therapy.  We will prescribe Wegovy 0.25 mg once a week.  Orders: -     Semaglutide-Weight Management; Inject 0.25 mg into the skin once a week for 28 days.  Dispense: 2 mL; Refill: 0    PHYSICAL EXAM:  Blood pressure 134/82, pulse 82, temperature 97.6 F (36.4 C), height 5\' 3"  (1.6 m), weight 223 lb (101.2 kg), SpO2 99 %. Body mass index is 39.5 kg/m.  General: She is overweight, cooperative, alert, well developed, and in no acute distress. PSYCH: Has normal mood, affect and thought process.   HEENT: EOMI, sclerae are anicteric. Lungs: Normal breathing effort, no conversational dyspnea. Extremities: No edema.  Neurologic: No gross sensory or motor deficits.  No tremors or fasciculations noted.    DIAGNOSTIC DATA REVIEWED:  BMET    Component Value Date/Time   NA 138 06/29/2022 1050   K 3.8 06/29/2022 1050   CL 98 06/29/2022 1050   CO2 20 06/29/2022 1050   GLUCOSE 92 06/29/2022 1050   GLUCOSE 90 11/24/2021 1330   BUN 23 06/29/2022 1050   CREATININE 0.71 06/29/2022 1050   CREATININE 0.66 04/06/2016 0931   CALCIUM 10.3 06/29/2022 1050   GFRNONAA >60 06/20/2018 0419   GFRAA >60 06/20/2018 0419   Lab Results   Component Value Date   HGBA1C 5.3 06/29/2022   HGBA1C 4.9 06/15/2018   Lab Results  Component Value Date   INSULIN 32.5 (H) 06/29/2022   Lab Results  Component Value Date   TSH 1.25 11/21/2022   CBC    Component Value Date/Time   WBC 7.1 06/29/2022 1050   WBC 6.7 06/22/2018 0434   RBC 4.46 06/29/2022 1050   RBC 3.64 (L) 06/22/2018 0434   HGB 14.4 06/29/2022 1050   HCT 42.4 06/29/2022 1050   PLT 254 06/29/2022 1050   MCV 95 06/29/2022 1050   MCH 32.3 06/29/2022 1050   MCH 31.3 06/22/2018 0434   MCHC 34.0 06/29/2022 1050   MCHC 31.1 06/22/2018 0434   RDW 12.0 06/29/2022 1050   Iron Studies No results found for: "IRON", "TIBC", "FERRITIN", "IRONPCTSAT" Lipid Panel     Component Value Date/Time   CHOL 179 06/29/2022 1050   TRIG 129 06/29/2022 1050   HDL 57 06/29/2022 1050   LDLCALC 99 06/29/2022 1050   Hepatic Function Panel     Component Value Date/Time   PROT 7.2 06/29/2022 1050   ALBUMIN 4.7 06/29/2022 1050   AST 17 06/29/2022 1050   ALT 19 06/29/2022 1050   ALKPHOS 56 06/29/2022 1050   BILITOT 0.3 06/29/2022 1050      Component Value Date/Time   TSH 1.25 11/21/2022 1550   Nutritional Lab Results  Component Value Date   VD25OH 42.1 06/29/2022     Return in about 4 weeks (around 12/28/2022) for For Weight Mangement with Dr. Rikki Spearing.Marland Kitchen She was informed of the importance of frequent follow up visits to maximize her success with intensive lifestyle modifications for her multiple health conditions.   ATTESTASTION STATEMENTS:  Reviewed by clinician on day of visit: allergies, medications, problem list, medical history, surgical history, family history, social history, and previous encounter notes.    Worthy Rancher, MD

## 2022-12-01 ENCOUNTER — Other Ambulatory Visit (HOSPITAL_COMMUNITY): Payer: Self-pay

## 2022-12-01 ENCOUNTER — Other Ambulatory Visit (INDEPENDENT_AMBULATORY_CARE_PROVIDER_SITE_OTHER): Payer: Self-pay | Admitting: Internal Medicine

## 2022-12-01 ENCOUNTER — Telehealth (INDEPENDENT_AMBULATORY_CARE_PROVIDER_SITE_OTHER): Payer: Self-pay | Admitting: Internal Medicine

## 2022-12-01 DIAGNOSIS — E88819 Insulin resistance, unspecified: Secondary | ICD-10-CM

## 2022-12-01 NOTE — Telephone Encounter (Signed)
Your information has been submitted to [employer]. To check for an updated outcome later, reopen this PA request from your dashboard. If [employer] has not responded to your request within 24 hours, contact [employer] at [phone]. If you think there may be a problem with your PA request, use our live chat feature at the bottom right.    

## 2022-12-01 NOTE — Telephone Encounter (Signed)
PA submitted for Wegovy, via cover my meds.  Awaiting a response.

## 2022-12-01 NOTE — Telephone Encounter (Signed)
Patient is stating she needs a PA for [facility] and needs it done by today

## 2022-12-04 ENCOUNTER — Other Ambulatory Visit (INDEPENDENT_AMBULATORY_CARE_PROVIDER_SITE_OTHER): Payer: Self-pay | Admitting: Internal Medicine

## 2022-12-05 ENCOUNTER — Encounter (INDEPENDENT_AMBULATORY_CARE_PROVIDER_SITE_OTHER): Payer: Self-pay | Admitting: Internal Medicine

## 2022-12-05 ENCOUNTER — Other Ambulatory Visit: Payer: Self-pay

## 2022-12-05 ENCOUNTER — Other Ambulatory Visit (HOSPITAL_COMMUNITY): Payer: Self-pay

## 2022-12-05 MED ORDER — WEGOVY 0.25 MG/0.5ML ~~LOC~~ SOAJ
0.2500 mg | SUBCUTANEOUS | 0 refills | Status: DC
  Filled 2022-12-05 – 2023-01-06 (×3): qty 2, 28d supply, fill #0

## 2022-12-05 NOTE — Telephone Encounter (Signed)
PA for Wegovy has been denied.

## 2022-12-09 ENCOUNTER — Other Ambulatory Visit (HOSPITAL_COMMUNITY): Payer: Self-pay

## 2022-12-12 ENCOUNTER — Encounter (INDEPENDENT_AMBULATORY_CARE_PROVIDER_SITE_OTHER): Payer: Self-pay | Admitting: Internal Medicine

## 2022-12-12 ENCOUNTER — Telehealth (INDEPENDENT_AMBULATORY_CARE_PROVIDER_SITE_OTHER): Payer: Self-pay

## 2022-12-12 NOTE — Telephone Encounter (Signed)
Patient sent a message and was very irritated that her PA was denied. Said she doesn't feel that its fair that she has to do an appeal when its our fault it was denied because we didn't send all the info that was requested. I sent her a message and apologized for any inconvenience and assured her I would send the requested info today but she may still need to do an appeal. Faxed info at 11:52.

## 2022-12-26 NOTE — Telephone Encounter (Signed)
Appeal was denied for [facility]. It was denied because patient does not have an obesity related condition.

## 2022-12-27 ENCOUNTER — Other Ambulatory Visit: Payer: Self-pay | Admitting: Physician Assistant

## 2022-12-28 ENCOUNTER — Ambulatory Visit (INDEPENDENT_AMBULATORY_CARE_PROVIDER_SITE_OTHER): Payer: Medicare Other | Admitting: Internal Medicine

## 2022-12-29 ENCOUNTER — Ambulatory Visit (INDEPENDENT_AMBULATORY_CARE_PROVIDER_SITE_OTHER): Payer: Medicare Other | Admitting: Internal Medicine

## 2023-01-04 ENCOUNTER — Ambulatory Visit (INDEPENDENT_AMBULATORY_CARE_PROVIDER_SITE_OTHER): Payer: No Typology Code available for payment source | Admitting: Internal Medicine

## 2023-01-04 ENCOUNTER — Encounter (INDEPENDENT_AMBULATORY_CARE_PROVIDER_SITE_OTHER): Payer: Self-pay | Admitting: Internal Medicine

## 2023-01-04 VITALS — BP 136/84 | HR 84 | Temp 97.7°F | Ht 63.0 in | Wt 226.0 lb

## 2023-01-04 DIAGNOSIS — G479 Sleep disorder, unspecified: Secondary | ICD-10-CM | POA: Diagnosis not present

## 2023-01-04 DIAGNOSIS — I10 Essential (primary) hypertension: Secondary | ICD-10-CM

## 2023-01-04 DIAGNOSIS — Z6841 Body Mass Index (BMI) 40.0 and over, adult: Secondary | ICD-10-CM

## 2023-01-04 DIAGNOSIS — F439 Reaction to severe stress, unspecified: Secondary | ICD-10-CM

## 2023-01-04 DIAGNOSIS — E88819 Insulin resistance, unspecified: Secondary | ICD-10-CM

## 2023-01-04 MED ORDER — METFORMIN HCL ER 500 MG PO TB24
500.0000 mg | ORAL_TABLET | Freq: Two times a day (BID) | ORAL | 0 refills | Status: DC
Start: 2023-01-04 — End: 2023-02-21

## 2023-01-04 NOTE — Assessment & Plan Note (Signed)
Multifactorial in part psychological.  I feel she would benefit from cognitive behavioral therapy for insomnia.  We also reviewed sleep hygiene and provided her some advice.  She may try magnesium glycinate in the evening, will reduce bluelight exposure 2 hours before bedtime and will start to read or do stretching yoga on the floor using YouTube videos.

## 2023-01-04 NOTE — Assessment & Plan Note (Signed)
Patient has been following reduced calorie nutrition plan and has also begun increased physical activity.  It has been very difficult for her to lose weight.  I feel she would benefit from pharmacotherapy.  She has tried metformin with limited response, she is on stimulants for other conditions so we cannot use sympathomimetics.  We recently tried topiramate had side effects and no clinical response.  I feel that she benefits from incretin therapy to allow for further reductions in caloric intake.  Medication was initially denied by her insurance we will have medical assistant follow-up.

## 2023-01-04 NOTE — Assessment & Plan Note (Signed)
Her HOMA-IR is 8 which is significant insulin resistance. Optimal level < 1.9. This is complex condition associated with genetics, ectopic fat and lifestyle factors. Insulin resistance may result in weight gain, abnormal cravings (particularly for carbs) and fatigue. This may result in additional weight gain and lead to pre-diabetes and diabetes if untreated.   Lab Results  Component Value Date   HGBA1C 5.3 06/29/2022   Lab Results  Component Value Date   INSULIN 32.5 (H) 06/29/2022   Lab Results  Component Value Date   GLUCOSE 92 06/29/2022   GLUCOSE 101 (H) 03/22/2016   She also has hyperinsulinemia.  She is currently on metformin XR 500 mg twice a day without any adverse effects and will continue medication.

## 2023-01-04 NOTE — Progress Notes (Signed)
Office: [phone]  /  Fax: [phone]  WEIGHT SUMMARY AND BIOMETRICS  Vitals Temp: 97.7 F (36.5 C) BP: 136/84 Pulse Rate: 84 SpO2: 99 %   Anthropometric Measurements Height: 5\' 3"  (1.6 m) Weight: 226 lb (102.5 kg) BMI (Calculated): 40.04 Weight at Last Visit: 223 lb Weight Lost Since Last Visit: 0 lb Weight Gained Since Last Visit: 3 lb Starting Weight: 224 lb Total Weight Loss (lbs): 0 lb (0 kg) Peak Weight: 230 lb   Body Composition  Body Fat %: 50.7 % Fat Mass (lbs): 114.8 lbs Muscle Mass (lbs): 106.2 lbs Total Body Water (lbs): 81.4 lbs Visceral Fat Rating : 17    No data recorded Today's Visit #: 9  Starting Date: 06/29/22   HPI  Chief Complaint: OBESITY  Avilyn is here to discuss her progress with her obesity treatment plan. She is on two meal replacements and high protein/low carb daily and states she is following her eating plan approximately 95 % of the time. She states she is exercising 20 minutes 3-4 times per week.  Interval History:  Since last office visit she has gained 3 lbs. She tried to incorporate meal replacements but noticed she is intolerant to whey protein. We had prescribed Wegovy last office visit but insurance denied coverage. Reasons are unknown.  She reports good adherence to reduced calorie nutritional plan. She has been working on reading food labels, not skipping meals, increasing protein intake at every meal, eating more fruits, eating more vegetables, drinking more water, making healthier choices, continues to exercise, eating out less, and working on meal prepping  Orixegenic Control: Reports problems with appetite and hunger signals.  Reports problems with satiety and satiation.  Denies problems with eating patterns and portion control.  Denies abnormal cravings. Denies feeling deprived or restricted.   Barriers identified: problems with sleep, moderate to high stress levels, and intolerance to whey protein  .    Pharmacotherapy for weight loss: She is currently taking Metformin (off label use for incretin effect and / or insulin resistance and / or diabetes prevention) with adequate clinical response  and without side effects..   Using collagen protein shake.   ASSESSMENT AND PLAN  TREATMENT PLAN FOR OBESITY:  Recommended Dietary Goals  Mitzi is currently in the action stage of change. As such, her goal is to continue weight management plan. She has agreed to: continue current plan use none whey protein shakes as meal replacement to further reduce calories.  Behavioral Intervention  We discussed the following Behavioral Modification Strategies today: increasing lean protein intake, decreasing simple carbohydrates , increasing vegetables, increasing lower glycemic fruits, increasing fiber rich foods, increasing water intake, planning for success, and advised to try journaling in the evening to offload problems and concerns, reduce exposure to bluelight 2 hours before bedtime, check insurance for talk therapy for cognitive behavioral therapy for insomnia .  Additional resources provided today: None  Recommended Physical Activity Goals  Rhylee has been advised to work up to 150 minutes of moderate intensity aerobic activity a week and strengthening exercises 2-3 times per week for cardiovascular health, weight loss maintenance and preservation of muscle mass.   She has agreed to :  Think about ways to increase daily physical activity and overcoming barriers to exercise  Pharmacotherapy We discussed various medication options to help Miracle with her weight loss efforts and we both agreed to :  We discussed various medication options to help Ahmiya with her weight loss efforts and we both agreed to  start incretin therapy. In addition to reduced calorie nutrition plan (RCNP), behavioral strategies and physical activity, Suzzanne would benefit from pharmacotherapy to assist with hunger signals, satiety  and cravings. This will reduce obesity-related health risks by inducing weight loss, and help reduce food consumption and adherence to [facility]) . It may also improve QOL by improving self-confidence and reduce the  setbacks associated with metabolic adaptations.   She tried topiramate and is currently on metformin without weight loss.  She has contraindications to sympathomimetics a she is on Evekeo and Buproprion.   After discussion of treatment options, mechanisms of action, benefits, side effects, contraindications and shared decision making she is agreeable to starting Wegovy 0.25 mg once a week. Patient also made aware that medication is indicated for long-term management of obesity and the risk of weight regain following discontinuation of treatment and hence the importance of adhering to medical weight loss plan.    Our medical assistant Misty Stanley will be contacting patient's insurance. ASSOCIATED CONDITIONS ADDRESSED TODAY  Essential hypertension  Insulin resistance Assessment & Plan: Her HOMA-IR is 8 which is significant insulin resistance. Optimal level < 1.9. This is complex condition associated with genetics, ectopic fat and lifestyle factors. Insulin resistance may result in weight gain, abnormal cravings (particularly for carbs) and fatigue. This may result in additional weight gain and lead to pre-diabetes and diabetes if untreated.   Lab Results  Component Value Date   HGBA1C 5.3 06/29/2022   Lab Results  Component Value Date   INSULIN 32.5 (H) 06/29/2022   Lab Results  Component Value Date   GLUCOSE 92 06/29/2022   GLUCOSE 101 (H) 03/22/2016   She also has hyperinsulinemia.  She is currently on metformin XR 500 mg twice a day without any adverse effects and will continue medication.   Orders: -     metFORMIN HCl ER; Take 1 tablet (500 mg total) by mouth 2 (two) times daily with a meal.  Dispense: 60 tablet; Refill: 0  Class 3 severe obesity with serious comorbidity and  body mass index (BMI) of 40.0 to 44.9 in adult, unspecified obesity type [facility]) Assessment & Plan: Patient has been following reduced calorie nutrition plan and has also begun increased physical activity.  It has been very difficult for her to lose weight.  I feel she would benefit from pharmacotherapy.  She has tried metformin with limited response, she is on stimulants for other conditions so we cannot use sympathomimetics.  We recently tried topiramate had side effects and no clinical response.  I feel that she benefits from incretin therapy to allow for further reductions in caloric intake.  Medication was initially denied by her insurance we will have medical assistant follow-up.   Stress Assessment & Plan: Patient has increased levels of stress due to work and family circumstances.  We discussed how this may affect weight and also strategies to reduce stress.  She will look into counseling through the workplace and trying to do relaxation yoga at night.  We also discussed journaling to offload her mind of problems 15 minutes a day in the evening.  Some of her stress may also be compounded by medications for ADHD.   Sleep difficulties Assessment & Plan: Multifactorial in part psychological.  I feel she would benefit from cognitive behavioral therapy for insomnia.  We also reviewed sleep hygiene and provided her some advice.  She may try magnesium glycinate in the evening, will reduce bluelight exposure 2 hours before bedtime and will start to read or  do stretching yoga on the floor using YouTube videos.     PHYSICAL EXAM:  Blood pressure 136/84, pulse 84, temperature 97.7 F (36.5 C), height 5\' 3"  (1.6 m), weight 226 lb (102.5 kg), SpO2 99%. Body mass index is 40.03 kg/m.  General: She is overweight, cooperative, alert, well developed, and in no acute distress. PSYCH: Has normal mood, affect and thought process.   HEENT: EOMI, sclerae are anicteric. Lungs: Normal breathing effort, no  conversational dyspnea. Extremities: No edema.  Neurologic: No gross sensory or motor deficits. No tremors or fasciculations noted.    DIAGNOSTIC DATA REVIEWED:  BMET    Component Value Date/Time   NA 138 06/29/2022 1050   K 3.8 06/29/2022 1050   CL 98 06/29/2022 1050   CO2 20 06/29/2022 1050   GLUCOSE 92 06/29/2022 1050   GLUCOSE 90 11/24/2021 1330   BUN 23 06/29/2022 1050   CREATININE 0.71 06/29/2022 1050   CREATININE 0.66 04/06/2016 0931   CALCIUM 10.3 06/29/2022 1050   GFRNONAA >60 06/20/2018 0419   GFRAA >60 06/20/2018 0419   Lab Results  Component Value Date   HGBA1C 5.3 06/29/2022   HGBA1C 4.9 06/15/2018   Lab Results  Component Value Date   INSULIN 32.5 (H) 06/29/2022   Lab Results  Component Value Date   TSH 1.25 11/21/2022   CBC    Component Value Date/Time   WBC 7.1 06/29/2022 1050   WBC 6.7 06/22/2018 0434   RBC 4.46 06/29/2022 1050   RBC 3.64 (L) 06/22/2018 0434   HGB 14.4 06/29/2022 1050   HCT 42.4 06/29/2022 1050   PLT 254 06/29/2022 1050   MCV 95 06/29/2022 1050   MCH 32.3 06/29/2022 1050   MCH 31.3 06/22/2018 0434   MCHC 34.0 06/29/2022 1050   MCHC 31.1 06/22/2018 0434   RDW 12.0 06/29/2022 1050   Iron Studies No results found for: "IRON", "TIBC", "FERRITIN", "IRONPCTSAT" Lipid Panel     Component Value Date/Time   CHOL 179 06/29/2022 1050   TRIG 129 06/29/2022 1050   HDL 57 06/29/2022 1050   LDLCALC 99 06/29/2022 1050   Hepatic Function Panel     Component Value Date/Time   PROT 7.2 06/29/2022 1050   ALBUMIN 4.7 06/29/2022 1050   AST 17 06/29/2022 1050   ALT 19 06/29/2022 1050   ALKPHOS 56 06/29/2022 1050   BILITOT 0.3 06/29/2022 1050      Component Value Date/Time   TSH 1.25 11/21/2022 1550   Nutritional Lab Results  Component Value Date   VD25OH 42.1 06/29/2022     No follow-ups on file.Marland Kitchen She was informed of the importance of frequent follow up visits to maximize her success with intensive lifestyle modifications  for her multiple health conditions.   ATTESTASTION STATEMENTS:  Reviewed by clinician on day of visit: allergies, medications, problem list, medical history, surgical history, family history, social history, and previous encounter notes.     Worthy Rancher, MD

## 2023-01-04 NOTE — Assessment & Plan Note (Signed)
Patient has increased levels of stress due to work and family circumstances.  We discussed how this may affect weight and also strategies to reduce stress.  She will look into counseling through the workplace and trying to do relaxation yoga at night.  We also discussed journaling to offload her mind of problems 15 minutes a day in the evening.  Some of her stress may also be compounded by medications for ADHD.

## 2023-01-04 NOTE — Telephone Encounter (Signed)
Resubmitted PA with additional information and appeal. Called and spoke with Danielle P with [employer]. Reginal Lutes appeal has been approved. Patient may pick up medication, approval is from December 24, 2022-April 26, 2023. Patient was sent a Mychart message with this information.

## 2023-01-06 ENCOUNTER — Other Ambulatory Visit (HOSPITAL_COMMUNITY): Payer: Self-pay

## 2023-01-26 ENCOUNTER — Encounter (INDEPENDENT_AMBULATORY_CARE_PROVIDER_SITE_OTHER): Payer: Self-pay

## 2023-02-01 ENCOUNTER — Other Ambulatory Visit: Payer: Self-pay | Admitting: Physician Assistant

## 2023-02-02 ENCOUNTER — Other Ambulatory Visit (HOSPITAL_COMMUNITY): Payer: Self-pay

## 2023-02-02 ENCOUNTER — Encounter (INDEPENDENT_AMBULATORY_CARE_PROVIDER_SITE_OTHER): Payer: Self-pay | Admitting: Internal Medicine

## 2023-02-02 ENCOUNTER — Ambulatory Visit (INDEPENDENT_AMBULATORY_CARE_PROVIDER_SITE_OTHER): Payer: No Typology Code available for payment source | Admitting: Internal Medicine

## 2023-02-02 VITALS — BP 128/84 | HR 83 | Temp 97.7°F | Ht 63.0 in | Wt 212.0 lb

## 2023-02-02 DIAGNOSIS — E669 Obesity, unspecified: Secondary | ICD-10-CM

## 2023-02-02 DIAGNOSIS — E88819 Insulin resistance, unspecified: Secondary | ICD-10-CM | POA: Diagnosis not present

## 2023-02-02 DIAGNOSIS — Z6837 Body mass index (BMI) 37.0-37.9, adult: Secondary | ICD-10-CM

## 2023-02-02 DIAGNOSIS — R638 Other symptoms and signs concerning food and fluid intake: Secondary | ICD-10-CM | POA: Diagnosis not present

## 2023-02-02 MED ORDER — WEGOVY 0.25 MG/0.5ML ~~LOC~~ SOAJ
0.2500 mg | SUBCUTANEOUS | 0 refills | Status: DC
Start: 2023-02-02 — End: 2023-02-21
  Filled 2023-02-02 – 2023-02-03 (×2): qty 2, 28d supply, fill #0

## 2023-02-02 MED ORDER — THERA VITAL M PO TABS: 1.0000 | ORAL_TABLET | Freq: Every day | ORAL | Status: AC

## 2023-02-02 NOTE — Assessment & Plan Note (Signed)
She has now realized she had been experiencing amplified orixegenic signaling, impaired satiety and inhibitory control. This is secondary to an abnormal energy regulation system and pathological neurohormonal pathways characteristic of excess adiposity.  She has had a tremendous response to incretin therapy and will continue.

## 2023-02-02 NOTE — Assessment & Plan Note (Signed)
Patient has been on [facility] for under 4 weeks she has lost 14 pounds.  BIA shows a reduction in body fat percentage but she is also lost muscle mass approximately 1:5 ratio which is what is expected.  I do feel that her weight loss rate is too rapid and she may be a hyper responder.  I recommend that she increase her calories to about 1300-1400 by adding healthy fats.  She will continue with protein intake at about 100 g/day.  She will also start taking a multivitamin with minerals.  We also discussed the benefits of strengthening exercises to prevent muscle loss and help with skin turgor.

## 2023-02-02 NOTE — Assessment & Plan Note (Signed)
Her HOMA-IR is 8 which is significant insulin resistance. Optimal level < 1.9. This is complex condition associated with genetics, ectopic fat and lifestyle factors. Insulin resistance may result in weight gain, abnormal cravings (particularly for carbs) and fatigue. This may result in additional weight gain and lead to pre-diabetes and diabetes if untreated.   Lab Results  Component Value Date   HGBA1C 5.3 06/29/2022   Lab Results  Component Value Date   INSULIN 32.5 (H) 06/29/2022   Lab Results  Component Value Date   GLUCOSE 92 06/29/2022   GLUCOSE 101 (H) 03/22/2016   Patient is having unremarkable response to low-dose Wegovy and may be a hyper responder.  We will discontinue metformin to avoid amplification of effect at present time since weight loss rate is to rapid.  Semaglutide will also function for pharmacoprophylaxis.

## 2023-02-02 NOTE — Progress Notes (Signed)
Office: [phone]  /  Fax: [phone]  WEIGHT SUMMARY AND BIOMETRICS  Vitals Temp: 97.7 F (36.5 C) BP: 128/84 Pulse Rate: 83 SpO2: 97 %   Anthropometric Measurements Height: 5\' 3"  (1.6 m) Weight: 212 lb (96.2 kg) BMI (Calculated): 37.56 Weight at Last Visit: 226lb Weight Lost Since Last Visit: 14lb Weight Gained Since Last Visit: 0 Starting Weight: 224lb Total Weight Loss (lbs): 12 lb (5.443 kg) Peak Weight: 230lb   Body Composition  Body Fat %: 48.8 % Fat Mass (lbs): 103.8 lbs Muscle Mass (lbs): 103.2 lbs Total Body Water (lbs): 74.4 lbs Visceral Fat Rating : 16    No data recorded Today's Visit #: 10  Starting Date: 06/29/22   HPI  Chief Complaint: OBESITY  Shannon Reyes is here to discuss her progress with her obesity treatment plan. She is on the following a lower carbohydrate, vegetable and lean protein rich diet plan and states she is following her eating plan approximately 100 % of the time. She states she is walking 1/2 mile a day. 5 times per week.  Interval History:  Since last office visit she has 14 pounds.  She was recently started on Wegovy 0.25 mg once a week, and has had a remarkable response to low-dose.  Her BIA suggest reduction in body fat but she is also lost some muscle approximately 20%.  She denies skipping meals and has been getting protein with each of her meals.  She has noticed a decrease in hunger signals, cravings and increased satiety.  She now realizes how much food noise she was having and is able to focus more on healthy eating.  She is been tracking and journaling and is averaging about 1200 to 1300 cal.  She is also on metformin which may be enhancing medication effects.  Orixegenic Control: Denies problems with appetite and hunger signals.  Denies problems with satiety and satiation.  Denies problems with eating patterns and portion control.  Denies abnormal cravings. Denies feeling deprived or restricted.   Barriers  identified: lack of time for self-care and multiple competing priorities.   Pharmacotherapy for weight loss: She is currently taking Wegovy with adequate clinical response  and without side effects..    ASSESSMENT AND PLAN  TREATMENT PLAN FOR OBESITY:  Recommended Dietary Goals  Shannon Reyes is currently in the action stage of change. As such, her goal is to continue weight management plan. She has agreed to: switch to 1400 cal and 90 to 100 g protein daily.  She will also be introducing healthy fats back in her diet.  We want to slow down weight loss rate to about 2 pounds a week.  This will help with preservation of muscle mass  Behavioral Intervention  We discussed the following Behavioral Modification Strategies today: increasing lean protein intake, decreasing simple carbohydrates , increasing vegetables, increasing lower glycemic fruits, increasing fiber rich foods, increasing water intake, work on tracking and journaling calories using tracking application, and add healthy fats .  Additional resources provided today: None  Recommended Physical Activity Goals  Shannon Reyes has been advised to work up to 150 minutes of moderate intensity aerobic activity a week and strengthening exercises 2-3 times per week for cardiovascular health, weight loss maintenance and preservation of muscle mass.   She has agreed to :  Think about ways to increase strengthening exercises and overcoming barriers.  Pharmacotherapy We discussed various medication options to help Shannon Reyes with her weight loss efforts and we both agreed to : continue current anti-obesity medication regimen  ASSOCIATED CONDITIONS ADDRESSED TODAY  Class 3 severe obesity with serious comorbidity and body mass index (BMI) of 40.0 to 44.9 in adult, unspecified obesity type [facility]) Assessment & Plan: Patient has been on [facility] for under 4 weeks she has lost 14 pounds.  BIA shows a reduction in body fat percentage but she is also lost muscle mass  approximately 1:5 ratio which is what is expected.  I do feel that her weight loss rate is too rapid and she may be a hyper responder.  I recommend that she increase her calories to about 1300-1400 by adding healthy fats.  She will continue with protein intake at about 100 g/day.  She will also start taking a multivitamin with minerals.  We also discussed the benefits of strengthening exercises to prevent muscle loss and help with skin turgor.  Orders: -     Wegovy; Inject 0.25 mg into the skin once a week.  Dispense: 2 mL; Refill: 0  Insulin resistance Assessment & Plan: Her HOMA-IR is 8 which is significant insulin resistance. Optimal level < 1.9. This is complex condition associated with genetics, ectopic fat and lifestyle factors. Insulin resistance may result in weight gain, abnormal cravings (particularly for carbs) and fatigue. This may result in additional weight gain and lead to pre-diabetes and diabetes if untreated.   Lab Results  Component Value Date   HGBA1C 5.3 06/29/2022   Lab Results  Component Value Date   INSULIN 32.5 (H) 06/29/2022   Lab Results  Component Value Date   GLUCOSE 92 06/29/2022   GLUCOSE 101 (H) 03/22/2016   Patient is having unremarkable response to low-dose Wegovy and may be a hyper responder.  We will discontinue metformin to avoid amplification of effect at present time since weight loss rate is to rapid.  Semaglutide will also function for pharmacoprophylaxis.   Orders: -     Wegovy; Inject 0.25 mg into the skin once a week.  Dispense: 2 mL; Refill: 0  Abnormal food appetite Assessment & Plan: She has now realized she had been experiencing amplified orixegenic signaling, impaired satiety and inhibitory control. This is secondary to an abnormal energy regulation system and pathological neurohormonal pathways characteristic of excess adiposity.  She has had a tremendous response to incretin therapy and will continue.   Other orders -     Thera  Vital M; Take 1 tablet by mouth daily.    PHYSICAL EXAM:  Blood pressure 128/84, pulse 83, temperature 97.7 F (36.5 C), height 5\' 3"  (1.6 m), weight 212 lb (96.2 kg), SpO2 97%. Body mass index is 37.55 kg/m.  General: She is overweight, cooperative, alert, well developed, and in no acute distress. PSYCH: Has normal mood, affect and thought process.   HEENT: EOMI, sclerae are anicteric. Lungs: Normal breathing effort, no conversational dyspnea. Extremities: No edema.  Neurologic: No gross sensory or motor deficits. No tremors or fasciculations noted.    DIAGNOSTIC DATA REVIEWED:  BMET    Component Value Date/Time   NA 138 06/29/2022 1050   K 3.8 06/29/2022 1050   CL 98 06/29/2022 1050   CO2 20 06/29/2022 1050   GLUCOSE 92 06/29/2022 1050   GLUCOSE 90 11/24/2021 1330   BUN 23 06/29/2022 1050   CREATININE 0.71 06/29/2022 1050   CREATININE 0.66 04/06/2016 0931   CALCIUM 10.3 06/29/2022 1050   GFRNONAA >60 06/20/2018 0419   GFRAA >60 06/20/2018 0419   Lab Results  Component Value Date   HGBA1C 5.3 06/29/2022   HGBA1C 4.9 06/15/2018  Lab Results  Component Value Date   INSULIN 32.5 (H) 06/29/2022   Lab Results  Component Value Date   TSH 1.25 11/21/2022   CBC    Component Value Date/Time   WBC 7.1 06/29/2022 1050   WBC 6.7 06/22/2018 0434   RBC 4.46 06/29/2022 1050   RBC 3.64 (L) 06/22/2018 0434   HGB 14.4 06/29/2022 1050   HCT 42.4 06/29/2022 1050   PLT 254 06/29/2022 1050   MCV 95 06/29/2022 1050   MCH 32.3 06/29/2022 1050   MCH 31.3 06/22/2018 0434   MCHC 34.0 06/29/2022 1050   MCHC 31.1 06/22/2018 0434   RDW 12.0 06/29/2022 1050   Iron Studies No results found for: "IRON", "TIBC", "FERRITIN", "IRONPCTSAT" Lipid Panel     Component Value Date/Time   CHOL 179 06/29/2022 1050   TRIG 129 06/29/2022 1050   HDL 57 06/29/2022 1050   LDLCALC 99 06/29/2022 1050   Hepatic Function Panel     Component Value Date/Time   PROT 7.2 06/29/2022 1050    ALBUMIN 4.7 06/29/2022 1050   AST 17 06/29/2022 1050   ALT 19 06/29/2022 1050   ALKPHOS 56 06/29/2022 1050   BILITOT 0.3 06/29/2022 1050      Component Value Date/Time   TSH 1.25 11/21/2022 1550   Nutritional Lab Results  Component Value Date   VD25OH 42.1 06/29/2022     No follow-ups on file.Marland Kitchen She was informed of the importance of frequent follow up visits to maximize her success with intensive lifestyle modifications for her multiple health conditions.   ATTESTASTION STATEMENTS:  Reviewed by clinician on day of visit: allergies, medications, problem list, medical history, surgical history, family history, social history, and previous encounter notes.     Worthy Rancher, MD

## 2023-02-03 ENCOUNTER — Other Ambulatory Visit (HOSPITAL_COMMUNITY): Payer: Self-pay

## 2023-02-08 ENCOUNTER — Other Ambulatory Visit: Payer: Self-pay | Admitting: Physician Assistant

## 2023-02-16 DIAGNOSIS — N393 Stress incontinence (female) (male): Secondary | ICD-10-CM | POA: Insufficient documentation

## 2023-02-20 ENCOUNTER — Encounter: Payer: Self-pay | Admitting: Family Medicine

## 2023-02-20 ENCOUNTER — Ambulatory Visit (INDEPENDENT_AMBULATORY_CARE_PROVIDER_SITE_OTHER): Payer: No Typology Code available for payment source | Admitting: Family Medicine

## 2023-02-20 VITALS — BP 124/76 | HR 81 | Temp 97.8°F | Ht 63.0 in | Wt 212.2 lb

## 2023-02-20 DIAGNOSIS — E039 Hypothyroidism, unspecified: Secondary | ICD-10-CM

## 2023-02-20 DIAGNOSIS — L219 Seborrheic dermatitis, unspecified: Secondary | ICD-10-CM | POA: Insufficient documentation

## 2023-02-20 DIAGNOSIS — Z6837 Body mass index (BMI) 37.0-37.9, adult: Secondary | ICD-10-CM | POA: Diagnosis not present

## 2023-02-20 MED ORDER — TRIAMCINOLONE ACETONIDE 0.1 % EX CREA
1.0000 | TOPICAL_CREAM | Freq: Every day | CUTANEOUS | 0 refills | Status: AC | PRN
Start: 2023-02-20 — End: ?

## 2023-02-20 NOTE — Progress Notes (Signed)
[facility] [address] [place] [address] Dept: [phone] Dept Fax: [phone]  Chronic Care Office Visit  Subjective:    Patient ID: Shannon Reyes, female    DOB: [birth year], 72 y.o..   MRN: [id number]  Chief Complaint  Patient presents with   Hypertension    3 month f/u. C/o having itchy ears.    History of Present Illness:  Patient is in today for reassessment of chronic medical issues.  Ms. Shannon Reyes has a history of essential hypertension. She was being managed on chlorthalidone 25 mg daily and lisinopril 40 mg twice daily.    Ms. Shannon Reyes has a history of hypothyroidism. She is on Armour Thyroid (thyroid extract) 135 mg daily.     Ms. Shannon Reyes is working with Dr. Rikki Reyes on weight management. She is currently on metformin XR 500 mg daily and semaglutide (Wegovy) 0.25 mg weekly. She anticipates an increase in her dose next week.  Ms. Shannon Reyes notes an itchiness in her ears. This has been an ongoing issue. She wears hearing aides and is worried about a potential reaction to the rubber/plastic.  Past Medical History: Patient Active Problem List   Diagnosis Date Noted   Abnormal food appetite 02/02/2023   Stress 01/04/2023   Sleep difficulties 01/04/2023   Vasomotor symptoms due to menopause 08/15/2022   Class 3 severe obesity with serious comorbidity and body mass index (BMI) of 40.0 to 44.9 in adult [facility]) 07/27/2022   Insulin resistance 07/13/2022   Anxiety with depression 10/27/2021   ADD (attention deficit disorder) 10/27/2021   Hypothyroidism 07/12/2021   S/P hysterectomy with oophorectomy 07/12/2021   Diverticulitis of sigmoid colon s/p lap colectomy 06/19/2018 06/23/2018   S/P partial colectomy 06/19/2018   Degeneration of lumbar intervertebral disc 03/30/2018   Perennial allergic rhinitis 03/15/2016   Sensorineural hearing loss (SNHL), bilateral 03/15/2016   Tinnitus, bilateral 03/15/2016   GERD (gastroesophageal  reflux disease) 01/25/2013   Systolic murmur 12/31/2012   Coronary artery fistula 12/31/2012   Past Surgical History:  Procedure Laterality Date   ABDOMINAL HYSTERECTOMY  1981   COMPLETE   ANKLE SURGERY Right 2004/2014   BLEPHAROPLASTY Left    CATARACT EXTRACTION Bilateral    LEFT OCT 2018, RIGHT NOV 2019   CESAREAN SECTION  1977 AND 1980   COLON RESECTION Left 06/19/2018   Procedure: LAPAROSCOPIC ASSISTED  LEFT COLON RESECTION;  Surgeon: Luretha Murphy, MD;  Location: [facility];  Service: General;  Laterality: Left;  ERAS PATHWAY   CYSTOSCOPY WITH STENT PLACEMENT Bilateral 06/19/2018   Procedure: CYSTOSCOPY WITH BILATERAL URETERAL CATHETER PLACEMENT;  Surgeon: Bjorn Pippin, MD;  Location: [facility];  Service: Urology;  Laterality: Bilateral;   OVARIAN CYST REMOVAL     Family History  Problem Relation Age of Onset   Heart disease Mother    Thyroid disease Mother    Depression Mother    Anxiety disorder Mother    Obesity Mother    Stroke Father    Hypertension Father    Heart disease Father    High Cholesterol Father    Parkinson's disease Maternal Grandmother    Cancer Paternal Grandmother        Pancreatic   Diabetes Paternal Grandfather    Diabetes Paternal Uncle    Breast cancer Neg Hx    Outpatient Medications Prior to Visit  Medication Sig Dispense Refill   buPROPion (WELLBUTRIN XL) 300 MG 24 hr tablet Take 300 mg by mouth daily.     chlorthalidone (  HYGROTON) 25 MG tablet TAKE 1 TABLET BY MOUTH DAILY 90 tablet 2   dicyclomine (BENTYL) 10 MG capsule Take 1 capsule (10 mg total) by mouth 4 (four) times daily -  before meals and at bedtime. Patient needs follow up appointment for future refills. Please call [phone] to schedule an appointment. 120 capsule 2   estradiol (ESTRACE) 1 MG tablet Take 1 tablet (1 mg total) by mouth daily. 90 tablet 3   EVEKEO 10 MG TABS Take 10 mg by mouth daily as needed (ADHD).   0   lisinopril (ZESTRIL) 40 MG tablet Take 1 tablet (40 mg  total) by mouth 2 (two) times daily. 180 tablet 3   metFORMIN (GLUCOPHAGE-XR) 500 MG 24 hr tablet Take 1 tablet (500 mg total) by mouth 2 (two) times daily with a meal. 60 tablet 0   mirabegron ER (MYRBETRIQ) 50 MG TB24 tablet Take 50 mg by mouth daily.     Multiple Vitamins-Minerals (MULTIVITAMIN) tablet Take 1 tablet by mouth daily.     omeprazole (PRILOSEC) 20 MG capsule TAKE 1 CAPSULE BY MOUTH DAILY - TAKE 30 TO 60 MINUTES BEFORE BREAKFAST 90 capsule 1   Semaglutide-Weight Management (WEGOVY) 0.25 MG/0.5ML SOAJ Inject 0.25 mg into the skin once a week. 2 mL 0   thyroid (ARMOUR THYROID) 120 MG tablet TAKE 1 TABLET BY MOUTH DAILY BEFORE BREAKFAST 30 tablet 11   thyroid (ARMOUR THYROID) 15 MG tablet Take 1 tablet (15 mg total) by mouth daily before breakfast. Needs appointment before next refill. 90 tablet 3   traZODone (DESYREL) 50 MG tablet Take 50 mg by mouth at bedtime.     No facility-administered medications prior to visit.   Allergies  Allergen Reactions   Erythromycin Base Itching   Codeine Itching   Objective:   Today's Vitals   02/20/23 1524  BP: 124/76  Pulse: 81  Temp: 97.8 F (36.6 C)  TempSrc: Temporal  SpO2: 98%  Weight: 212 lb 3.2 oz (96.3 kg)  Height: 5\' 3"  (1.6 m)   Body mass index is 37.59 kg/m.   General: Well developed, well nourished. No acute distress. HEENT: Normocephalic, non-traumatic. External ears normal. There is some scaliness at the   external auditory meatus on the left. EAC and TMs normal bilaterally. Psych: Alert and oriented. Normal mood and affect.  Health Maintenance Due  Topic Date Due   Hepatitis C Screening  Never done      Assessment & Plan:   Problem List Items Addressed This Visit       Endocrine   Hypothyroidism - Primary    TSH at goal. Continue Armour Thyroid 135 mg daily.        Musculoskeletal and Integument   Seborrhea    Recommend she use TAC cream on a cotton swab, applied just inside the external auditory  meatus daily.      Relevant Medications   triamcinolone cream (KENALOG) 0.1 %     Other   Class 2 obesity due to excess calories with body mass index (BMI) of 37.0 to 37.9 in adult    Maximum weight: 232 lbs (08/2022) Current weight: 212 lbs Weight change since last visit: - 10 lbs Total weight loss: 20 lbs (8.6%)  Continue metformin 500 mg daily and semaglutide 0.25 mg weekly. Continue efforts at improved diet and regular exercise. Follow-up with Dr. Rikki Reyes.       Return in about 4 months (around 06/22/2023) for Reassessment.   Loyola Mast, MD

## 2023-02-20 NOTE — Assessment & Plan Note (Addendum)
Maximum weight: 232 lbs (08/2022) Current weight: 212 lbs Weight change since last visit: - 10 lbs Total weight loss: 20 lbs (8.6%)  Continue metformin 500 mg daily and semaglutide 0.25 mg weekly. Continue efforts at improved diet and regular exercise. Follow-up with Dr. Rikki Spearing.

## 2023-02-20 NOTE — Assessment & Plan Note (Signed)
Recommend she use TAC cream on a cotton swab, applied just inside the external auditory meatus daily.

## 2023-02-20 NOTE — Assessment & Plan Note (Signed)
TSH at goal. Continue Armour Thyroid 135 mg daily.

## 2023-02-21 ENCOUNTER — Other Ambulatory Visit (HOSPITAL_COMMUNITY): Payer: Self-pay

## 2023-02-21 ENCOUNTER — Ambulatory Visit (INDEPENDENT_AMBULATORY_CARE_PROVIDER_SITE_OTHER): Payer: No Typology Code available for payment source | Admitting: Internal Medicine

## 2023-02-21 ENCOUNTER — Ambulatory Visit: Payer: No Typology Code available for payment source | Admitting: Family Medicine

## 2023-02-21 ENCOUNTER — Encounter (INDEPENDENT_AMBULATORY_CARE_PROVIDER_SITE_OTHER): Payer: Self-pay | Admitting: Internal Medicine

## 2023-02-21 VITALS — BP 130/82 | HR 76 | Temp 97.7°F | Ht 63.0 in | Wt 210.0 lb

## 2023-02-21 DIAGNOSIS — E88819 Insulin resistance, unspecified: Secondary | ICD-10-CM

## 2023-02-21 DIAGNOSIS — Z6837 Body mass index (BMI) 37.0-37.9, adult: Secondary | ICD-10-CM

## 2023-02-21 MED ORDER — SEMAGLUTIDE-WEIGHT MANAGEMENT 0.5 MG/0.5ML ~~LOC~~ SOAJ
0.5000 mg | SUBCUTANEOUS | 0 refills | Status: AC
Start: 2023-02-21 — End: 2023-03-24
  Filled 2023-02-21 – 2023-02-24 (×2): qty 2, 28d supply, fill #0

## 2023-02-21 MED ORDER — METFORMIN HCL ER 500 MG PO TB24
500.0000 mg | ORAL_TABLET | Freq: Two times a day (BID) | ORAL | 0 refills | Status: AC
Start: 2023-02-21 — End: ?
  Filled 2023-02-21 – 2023-02-24 (×2): qty 60, 30d supply, fill #0

## 2023-02-21 NOTE — Assessment & Plan Note (Signed)
Her HOMA-IR is 8 which is significant insulin resistance. Optimal level < 1.9. This is complex condition associated with genetics, ectopic fat and lifestyle factors. Insulin resistance may result in weight gain, abnormal cravings (particularly for carbs) and fatigue. This may result in additional weight gain and lead to pre-diabetes and diabetes if untreated.   Lab Results  Component Value Date   HGBA1C 5.3 06/29/2022   Lab Results  Component Value Date   INSULIN 32.5 (H) 06/29/2022   Lab Results  Component Value Date   GLUCOSE 92 06/29/2022   GLUCOSE 101 (H) 03/22/2016   Patient would like to continue to take metformin we agreed on 1 tablet once a day.  We were increasing semaglutide to 0.5 mg once a week.

## 2023-02-21 NOTE — Assessment & Plan Note (Signed)
We increased consumption of healthy fats to reduce weight loss rate.  This time around she did not lose muscle.  She is noticing a waning effect of Wegovy.  We will increase medication to 0.5 mg once a week.

## 2023-02-21 NOTE — Progress Notes (Signed)
Office: [phone]  /  Fax: [phone]  WEIGHT SUMMARY AND BIOMETRICS  Vitals Temp: 97.7 F (36.5 C) BP: 130/82 Pulse Rate: 76 SpO2: 98 %   Anthropometric Measurements Height: 5\' 3"  (1.6 m) Weight: 210 lb (95.3 kg) BMI (Calculated): 37.21 Weight at Last Visit: 212 lb Weight Lost Since Last Visit: 2 lb Weight Gained Since Last Visit: 0 lb Starting Weight: 224 lb Total Weight Loss (lbs): 14 lb (6.35 kg) Peak Weight: 230 lb   Body Composition  Body Fat %: 47.6 % Fat Mass (lbs): 100.2 lbs Muscle Mass (lbs): 104.6 lbs Total Body Water (lbs): 78.8 lbs Visceral Fat Rating : 15    No data recorded Today's Visit #: 11  Starting Date: 06/29/22   HPI  Chief Complaint: OBESITY  Anifer is here to discuss her progress with her obesity treatment plan. She is on the the Category 2 Plan and states she is following her eating plan approximately 100 % of the time. She states she is exercising 15 minutes 4-5 times per week.  Interval History:  Since last office visit she has lost 2 pounds. She reports good adherence to reduced calorie nutritional plan. She has been working on not skipping meals, increasing protein intake at every meal, avoiding and / or reducing liquid calories, and avoiding or reducing simple and processed carbohydrates  Orexigenic Control: Reports problems with appetite and hunger signals.  Denies problems with satiety and satiation.  Denies problems with eating patterns and portion control.  Reports abnormal cravings. Denies feeling deprived or restricted.   Barriers identified: strong hunger signals and appetite.   Pharmacotherapy for weight loss: She is currently taking Wegovy with adequate clinical response  and without side effects..    ASSESSMENT AND PLAN  TREATMENT PLAN FOR OBESITY:  Recommended Dietary Goals  Eunita is currently in the action stage of change. As such, her goal is to continue weight management plan. She has agreed to:  continue current plan  Behavioral Intervention  We discussed the following Behavioral Modification Strategies today: increasing lean protein intake, decreasing simple carbohydrates , increasing vegetables, increasing water intake, continue to practice mindfulness when eating, and planning for success.  Additional resources provided today: None  Recommended Physical Activity Goals  Abbi has been advised to work up to 150 minutes of moderate intensity aerobic activity a week and strengthening exercises 2-3 times per week for cardiovascular health, weight loss maintenance and preservation of muscle mass.   She has agreed to :  Think about ways to increase daily physical activity and overcoming barriers to exercise  Pharmacotherapy We discussed various medication options to help Yamani with her weight loss efforts and we both agreed to : increase wegovy .5 mg a week.  ASSOCIATED CONDITIONS ADDRESSED TODAY  Class 2 severe obesity due to excess calories with serious comorbidity and body mass index (BMI) of 37.0 to 37.9 in adult [facility]) Assessment & Plan: We increased consumption of healthy fats to reduce weight loss rate.  This time around she did not lose muscle.  She is noticing a waning effect of Wegovy.  We will increase medication to 0.5 mg once a week.  Orders: -     Semaglutide-Weight Management; Inject 0.5 mg into the skin once a week for 28 days.  Dispense: 2 mL; Refill: 0  Insulin resistance Assessment & Plan: Her HOMA-IR is 8 which is significant insulin resistance. Optimal level < 1.9. This is complex condition associated with genetics, ectopic fat and lifestyle factors. Insulin resistance may  result in weight gain, abnormal cravings (particularly for carbs) and fatigue. This may result in additional weight gain and lead to pre-diabetes and diabetes if untreated.   Lab Results  Component Value Date   HGBA1C 5.3 06/29/2022   Lab Results  Component Value Date   INSULIN 32.5 (H)  06/29/2022   Lab Results  Component Value Date   GLUCOSE 92 06/29/2022   GLUCOSE 101 (H) 03/22/2016   Patient would like to continue to take metformin we agreed on 1 tablet once a day.  We were increasing semaglutide to 0.5 mg once a week.   Orders: -     metFORMIN HCl ER; Take 1 tablet (500 mg total) by mouth 2 (two) times daily with a meal.  Dispense: 60 tablet; Refill: 0    PHYSICAL EXAM:  Blood pressure 130/82, pulse 76, temperature 97.7 F (36.5 C), height 5\' 3"  (1.6 m), weight 210 lb (95.3 kg), SpO2 98%. Body mass index is 37.2 kg/m.  General: She is overweight, cooperative, alert, well developed, and in no acute distress. PSYCH: Has normal mood, affect and thought process.   HEENT: EOMI, sclerae are anicteric. Lungs: Normal breathing effort, no conversational dyspnea. Extremities: No edema.  Neurologic: No gross sensory or motor deficits. No tremors or fasciculations noted.    DIAGNOSTIC DATA REVIEWED:  BMET    Component Value Date/Time   NA 138 06/29/2022 1050   K 3.8 06/29/2022 1050   CL 98 06/29/2022 1050   CO2 20 06/29/2022 1050   GLUCOSE 92 06/29/2022 1050   GLUCOSE 90 11/24/2021 1330   BUN 23 06/29/2022 1050   CREATININE 0.71 06/29/2022 1050   CREATININE 0.66 04/06/2016 0931   CALCIUM 10.3 06/29/2022 1050   GFRNONAA >60 06/20/2018 0419   GFRAA >60 06/20/2018 0419   Lab Results  Component Value Date   HGBA1C 5.3 06/29/2022   HGBA1C 4.9 06/15/2018   Lab Results  Component Value Date   INSULIN 32.5 (H) 06/29/2022   Lab Results  Component Value Date   TSH 1.25 11/21/2022   CBC    Component Value Date/Time   WBC 7.1 06/29/2022 1050   WBC 6.7 06/22/2018 0434   RBC 4.46 06/29/2022 1050   RBC 3.64 (L) 06/22/2018 0434   HGB 14.4 06/29/2022 1050   HCT 42.4 06/29/2022 1050   PLT 254 06/29/2022 1050   MCV 95 06/29/2022 1050   MCH 32.3 06/29/2022 1050   MCH 31.3 06/22/2018 0434   MCHC 34.0 06/29/2022 1050   MCHC 31.1 06/22/2018 0434   RDW  12.0 06/29/2022 1050   Iron Studies No results found for: "IRON", "TIBC", "FERRITIN", "IRONPCTSAT" Lipid Panel     Component Value Date/Time   CHOL 179 06/29/2022 1050   TRIG 129 06/29/2022 1050   HDL 57 06/29/2022 1050   LDLCALC 99 06/29/2022 1050   Hepatic Function Panel     Component Value Date/Time   PROT 7.2 06/29/2022 1050   ALBUMIN 4.7 06/29/2022 1050   AST 17 06/29/2022 1050   ALT 19 06/29/2022 1050   ALKPHOS 56 06/29/2022 1050   BILITOT 0.3 06/29/2022 1050      Component Value Date/Time   TSH 1.25 11/21/2022 1550   Nutritional Lab Results  Component Value Date   VD25OH 42.1 06/29/2022     Return in about 3 weeks (around 03/14/2023) for For Weight Mangement with Dr. Rikki Spearing.Marland Kitchen She was informed of the importance of frequent follow up visits to maximize her success with intensive lifestyle modifications for her  multiple health conditions.   ATTESTASTION STATEMENTS:  Reviewed by clinician on day of visit: allergies, medications, problem list, medical history, surgical history, family history, social history, and previous encounter notes.     Worthy Rancher, MD

## 2023-02-24 ENCOUNTER — Other Ambulatory Visit (HOSPITAL_COMMUNITY): Payer: Self-pay

## 2023-03-15 ENCOUNTER — Other Ambulatory Visit (HOSPITAL_COMMUNITY): Payer: Self-pay

## 2023-03-15 ENCOUNTER — Encounter (INDEPENDENT_AMBULATORY_CARE_PROVIDER_SITE_OTHER): Payer: Self-pay | Admitting: Internal Medicine

## 2023-03-15 ENCOUNTER — Ambulatory Visit (INDEPENDENT_AMBULATORY_CARE_PROVIDER_SITE_OTHER): Payer: No Typology Code available for payment source | Admitting: Internal Medicine

## 2023-03-15 VITALS — BP 133/83 | HR 73 | Temp 97.8°F | Ht 63.0 in | Wt 208.0 lb

## 2023-03-15 DIAGNOSIS — E66812 Obesity, class 2: Secondary | ICD-10-CM | POA: Diagnosis not present

## 2023-03-15 DIAGNOSIS — E88819 Insulin resistance, unspecified: Secondary | ICD-10-CM | POA: Diagnosis not present

## 2023-03-15 DIAGNOSIS — Z6837 Body mass index (BMI) 37.0-37.9, adult: Secondary | ICD-10-CM

## 2023-03-15 DIAGNOSIS — R110 Nausea: Secondary | ICD-10-CM | POA: Diagnosis not present

## 2023-03-15 MED ORDER — METFORMIN HCL ER 500 MG PO TB24
500.0000 mg | ORAL_TABLET | Freq: Two times a day (BID) | ORAL | 0 refills | Status: DC
  Filled 2023-03-15 – 2023-03-31 (×2): qty 60, 30d supply, fill #0

## 2023-03-15 MED ORDER — SEMAGLUTIDE-WEIGHT MANAGEMENT 1 MG/0.5ML ~~LOC~~ SOAJ
1.0000 mg | SUBCUTANEOUS | 0 refills | Status: DC
Start: 2023-03-15 — End: 2023-04-10
  Filled 2023-03-15: qty 2, 28d supply, fill #0

## 2023-03-15 NOTE — Assessment & Plan Note (Signed)
Her HOMA-IR is 8 which is significant insulin resistance. Optimal level < 1.9. This is complex condition associated with genetics, ectopic fat and lifestyle factors. Insulin resistance may result in weight gain, abnormal cravings (particularly for carbs) and fatigue. This may result in additional weight gain and lead to pre-diabetes and diabetes if untreated.   Lab Results  Component Value Date   HGBA1C 5.3 06/29/2022   Lab Results  Component Value Date   INSULIN 32.5 (H) 06/29/2022   Lab Results  Component Value Date   GLUCOSE 92 06/29/2022   GLUCOSE 101 (H) 03/22/2016   Patient would like to continue to take metformin we agreed on 1 tablet once a day.  We were increasing semaglutide to 1 mg once a week.

## 2023-03-15 NOTE — Assessment & Plan Note (Signed)
 See obesity treatment plan

## 2023-03-15 NOTE — Progress Notes (Signed)
Office: [phone]  /  Fax: [phone]  WEIGHT SUMMARY AND BIOMETRICS  Vitals Temp: 97.8 F (36.6 C) BP: 133/83 Pulse Rate: 73 SpO2: 97 %   Anthropometric Measurements Height: 5\' 3"  (1.6 m) Weight: 208 lb (94.3 kg) BMI (Calculated): 36.85 Weight at Last Visit: 210 lb Weight Lost Since Last Visit: 2 lb Weight Gained Since Last Visit: 0 lb Starting Weight: 224 lb Total Weight Loss (lbs): 16 lb (7.258 kg) Peak Weight: 230 lb   Body Composition  Body Fat %: 49.4 % Fat Mass (lbs): 102.8 lbs Muscle Mass (lbs): 99.8 lbs Total Body Water (lbs): 75 lbs Visceral Fat Rating : 16    No data recorded Today's Visit #: 12  Starting Date: 06/29/22    Chief Complaint: OBESITY  HPI  Discussed the use of AI scribe software for clinical note transcription with the patient, who gave verbal consent to proceed.  History of Present Illness   The patient, with a history of obesity, presented for a follow-up consultation. She has been adhering to the category two plan about 95% of the time and has been engaging in regular exercise, specifically walking for 15 minutes four times a week. Since the last visit, she has lost two pounds. At the previous visit, her [facility] dosage was increased to 0.5 milligrams once a week.  The patient reported experiencing intermittent waves of nausea, which she attributed to the increased dosage of Wegovy. The nausea was not linked to any specific food intake and was not consistently occurring in the days following the medication administration. She also reported an increased craving for chocolate.  In terms of bowel movements, the patient reported minimal constipation, which she managed by increasing water intake. She noted a decrease in clothing size, from a size 16 to a size 14, and reported a good energy level. Sleep quality was reported as slightly improved, but the patient still experienced frequent awakenings during the night.  The patient's diet  consisted of avocado bread with an egg for breakfast, cheese, nuts, and fruit for lunch, and a dinner of protein, a small amount of rice, and vegetables. She expressed a desire to increase her protein intake and was open to adding a protein shake to her lunch.  The patient expressed a willingness to increase her Wegovy dosage to 1 milligram once a week, despite the potential for increased nausea. She reported no symptoms of low blood sugar. The patient also reported engaging in exercises to strengthen her shoulder muscles due to a pre-existing shoulder issue.       Barriers identified: inadequate sleep and moderate to high levels of stress.   Pharmacotherapy for weight loss: She is currently taking Wegovy with adequate clinical response  and experiencing the following side effects: Occasional self-limiting nausea..    ASSESSMENT AND PLAN  TREATMENT PLAN FOR OBESITY:  Recommended Dietary Goals  Shannon Reyes is currently in the action stage of change. As such, her goal is to continue weight management plan. She has agreed to: continue current plan  Behavioral Intervention  We discussed the following Behavioral Modification Strategies today: increasing lean protein intake, decreasing simple carbohydrates , increasing vegetables, increasing lower glycemic fruits, increasing fiber rich foods, increasing water intake, continue to practice mindfulness when eating, and planning for success.  Additional resources provided today: None  Recommended Physical Activity Goals  Shannon Reyes has been advised to work up to 150 minutes of moderate intensity aerobic activity a week and strengthening exercises 2-3 times per week for cardiovascular health, weight  loss maintenance and preservation of muscle mass.   She has agreed to :  Continue to progress physical activity levels  Pharmacotherapy We discussed various medication options to help Shannon Reyes with her weight loss efforts and we both agreed to : increase Wegovy  to 1 mg once a week  ASSOCIATED CONDITIONS ADDRESSED TODAY  Nausea Assessment & Plan: Mild, intermittent and associated with GLP-1 therapy.  We discussed nonpharmacological ways to deal with nausea.  We may also consider Zofran but she does not feel she needs a medication at this time.  Monitor for worsening symptoms while titrating medication up.   Class 2 severe obesity due to excess calories with serious comorbidity and body mass index (BMI) of 37.0 to 37.9 in adult [facility]) Assessment & Plan: See obesity treatment plan   Insulin resistance Assessment & Plan: Her HOMA-IR is 8 which is significant insulin resistance. Optimal level < 1.9. This is complex condition associated with genetics, ectopic fat and lifestyle factors. Insulin resistance may result in weight gain, abnormal cravings (particularly for carbs) and fatigue. This may result in additional weight gain and lead to pre-diabetes and diabetes if untreated.   Lab Results  Component Value Date   HGBA1C 5.3 06/29/2022   Lab Results  Component Value Date   INSULIN 32.5 (H) 06/29/2022   Lab Results  Component Value Date   GLUCOSE 92 06/29/2022   GLUCOSE 101 (H) 03/22/2016   Patient would like to continue to take metformin we agreed on 1 tablet once a day.  We were increasing semaglutide to 1 mg once a week.   Orders: -     metFORMIN HCl ER; Take 1 tablet (500 mg total) by mouth 2 (two) times daily with a meal.  Dispense: 60 tablet; Refill: 0  Other orders -     Semaglutide-Weight Management; Inject 1 mg into the skin once a week.  Dispense: 2 mL; Refill: 0    PHYSICAL EXAM:  Blood pressure 133/83, pulse 73, temperature 97.8 F (36.6 C), height 5\' 3"  (1.6 m), weight 208 lb (94.3 kg), SpO2 97%. Body mass index is 36.85 kg/m.  General: She is overweight, cooperative, alert, well developed, and in no acute distress. PSYCH: Has normal mood, affect and thought process.   HEENT: EOMI, sclerae are anicteric. Lungs:  Normal breathing effort, no conversational dyspnea. Extremities: No edema.  Neurologic: No gross sensory or motor deficits. No tremors or fasciculations noted.    DIAGNOSTIC DATA REVIEWED:  BMET    Component Value Date/Time   NA 138 06/29/2022 1050   K 3.8 06/29/2022 1050   CL 98 06/29/2022 1050   CO2 20 06/29/2022 1050   GLUCOSE 92 06/29/2022 1050   GLUCOSE 90 11/24/2021 1330   BUN 23 06/29/2022 1050   CREATININE 0.71 06/29/2022 1050   CREATININE 0.66 04/06/2016 0931   CALCIUM 10.3 06/29/2022 1050   GFRNONAA >60 06/20/2018 0419   GFRAA >60 06/20/2018 0419   Lab Results  Component Value Date   HGBA1C 5.3 06/29/2022   HGBA1C 4.9 06/15/2018   Lab Results  Component Value Date   INSULIN 32.5 (H) 06/29/2022   Lab Results  Component Value Date   TSH 1.25 11/21/2022   CBC    Component Value Date/Time   WBC 7.1 06/29/2022 1050   WBC 6.7 06/22/2018 0434   RBC 4.46 06/29/2022 1050   RBC 3.64 (L) 06/22/2018 0434   HGB 14.4 06/29/2022 1050   HCT 42.4 06/29/2022 1050   PLT 254 06/29/2022 1050  MCV 95 06/29/2022 1050   MCH 32.3 06/29/2022 1050   MCH 31.3 06/22/2018 0434   MCHC 34.0 06/29/2022 1050   MCHC 31.1 06/22/2018 0434   RDW 12.0 06/29/2022 1050   Iron Studies No results found for: "IRON", "TIBC", "FERRITIN", "IRONPCTSAT" Lipid Panel     Component Value Date/Time   CHOL 179 06/29/2022 1050   TRIG 129 06/29/2022 1050   HDL 57 06/29/2022 1050   LDLCALC 99 06/29/2022 1050   Hepatic Function Panel     Component Value Date/Time   PROT 7.2 06/29/2022 1050   ALBUMIN 4.7 06/29/2022 1050   AST 17 06/29/2022 1050   ALT 19 06/29/2022 1050   ALKPHOS 56 06/29/2022 1050   BILITOT 0.3 06/29/2022 1050      Component Value Date/Time   TSH 1.25 11/21/2022 1550   Nutritional Lab Results  Component Value Date   VD25OH 42.1 06/29/2022     Return in about 5 weeks (around 04/19/2023) for For Weight Mangement with Dr. Rikki Spearing.Marland Kitchen She was informed of the importance  of frequent follow up visits to maximize her success with intensive lifestyle modifications for her multiple health conditions.   ATTESTASTION STATEMENTS:  Reviewed by clinician on day of visit: allergies, medications, problem list, medical history, surgical history, family history, social history, and previous encounter notes.     Worthy Rancher, MD

## 2023-03-15 NOTE — Assessment & Plan Note (Signed)
Mild, intermittent and associated with GLP-1 therapy.  We discussed nonpharmacological ways to deal with nausea.  We may also consider Zofran but she does not feel she needs a medication at this time.  Monitor for worsening symptoms while titrating medication up.

## 2023-03-16 ENCOUNTER — Other Ambulatory Visit (HOSPITAL_COMMUNITY): Payer: Self-pay

## 2023-03-27 ENCOUNTER — Ambulatory Visit (INDEPENDENT_AMBULATORY_CARE_PROVIDER_SITE_OTHER): Payer: Medicare Other | Admitting: Internal Medicine

## 2023-03-28 ENCOUNTER — Other Ambulatory Visit: Payer: Self-pay | Admitting: Physician Assistant

## 2023-03-31 ENCOUNTER — Other Ambulatory Visit (INDEPENDENT_AMBULATORY_CARE_PROVIDER_SITE_OTHER): Payer: Self-pay | Admitting: Internal Medicine

## 2023-03-31 ENCOUNTER — Other Ambulatory Visit (HOSPITAL_COMMUNITY): Payer: Self-pay

## 2023-03-31 DIAGNOSIS — E88819 Insulin resistance, unspecified: Secondary | ICD-10-CM

## 2023-04-04 ENCOUNTER — Telehealth: Payer: Self-pay | Admitting: Physician Assistant

## 2023-04-04 MED ORDER — OMEPRAZOLE 20 MG PO CPDR: 20.0000 mg | DELAYED_RELEASE_CAPSULE | Freq: Every day | ORAL | 0 refills | Status: DC

## 2023-04-04 NOTE — Telephone Encounter (Signed)
Omeprazole sent to [facility]

## 2023-04-04 NOTE — Telephone Encounter (Signed)
Inbound call from patient, requesting refills for medications sent to [facility].

## 2023-04-10 ENCOUNTER — Ambulatory Visit (INDEPENDENT_AMBULATORY_CARE_PROVIDER_SITE_OTHER): Payer: No Typology Code available for payment source | Admitting: Internal Medicine

## 2023-04-10 ENCOUNTER — Encounter (INDEPENDENT_AMBULATORY_CARE_PROVIDER_SITE_OTHER): Payer: Self-pay | Admitting: Internal Medicine

## 2023-04-10 ENCOUNTER — Other Ambulatory Visit (HOSPITAL_COMMUNITY): Payer: Self-pay

## 2023-04-10 VITALS — BP 138/84 | HR 87 | Temp 97.7°F | Ht 63.0 in | Wt 198.0 lb

## 2023-04-10 DIAGNOSIS — Z6837 Body mass index (BMI) 37.0-37.9, adult: Secondary | ICD-10-CM | POA: Diagnosis not present

## 2023-04-10 DIAGNOSIS — E669 Obesity, unspecified: Secondary | ICD-10-CM | POA: Diagnosis not present

## 2023-04-10 DIAGNOSIS — E88819 Insulin resistance, unspecified: Secondary | ICD-10-CM | POA: Diagnosis not present

## 2023-04-10 MED ORDER — METFORMIN HCL ER 500 MG PO TB24
500.0000 mg | ORAL_TABLET | Freq: Two times a day (BID) | ORAL | 0 refills | Status: DC
Start: 2023-04-10 — End: 2023-05-15
  Filled 2023-04-10 – 2023-05-10 (×2): qty 60, 30d supply, fill #0

## 2023-04-10 MED ORDER — SEMAGLUTIDE-WEIGHT MANAGEMENT 1 MG/0.5ML ~~LOC~~ SOAJ
1.0000 mg | SUBCUTANEOUS | 0 refills | Status: DC
  Filled 2023-04-10 – 2023-04-14 (×2): qty 2, 28d supply, fill #0

## 2023-04-10 NOTE — Progress Notes (Unsigned)
Office: [phone]  /  Fax: [phone]  WEIGHT SUMMARY AND BIOMETRICS  Vitals Temp: 97.7 F (36.5 C) BP: 138/84 Pulse Rate: 87 SpO2: 98 %   Anthropometric Measurements Height: 5\' 3"  (1.6 m) Weight: 198 lb (89.8 kg) BMI (Calculated): 35.08 Weight at Last Visit: 208 lb Weight Lost Since Last Visit: 10 lb Weight Gained Since Last Visit: 0 lb Starting Weight: 224 lb Total Weight Loss (lbs): 26 lb (11.8 kg) Peak Weight: 230 lb   Body Composition  Body Fat %: 46.9 % Fat Mass (lbs): 93.2 lbs Muscle Mass (lbs): 100 lbs Total Body Water (lbs): 68.8 lbs Visceral Fat Rating : 15    No data recorded Today's Visit #: 13  Starting Date: 06/29/22   HPI  Chief Complaint: OBESITY  Sheretha is here to discuss her progress with her obesity treatment plan. She is on the following a lower carbohydrate, vegetable and lean protein rich diet plan and states she is following her eating plan approximately 95 % of the time. She states she is exercising 20 minutes 3-4 times per week.  Interval History:  Since last office visit she has lost 10 pounds. She reports good adherence to reduced calorie nutritional plan. She has been working on reading food labels, not skipping meals, increasing protein intake at every meal, drinking more water, making healthier choices, reducing portion sizes, and incorporating more whole foods  Orexigenic Control: Denies problems with appetite and hunger signals.  Denies problems with satiety and satiation.  Denies problems with eating patterns and portion control.  Denies abnormal cravings. Denies feeling deprived or restricted.   Barriers identified: orthopedic problems, medical conditions or chronic pain affecting mobility.   Pharmacotherapy for weight loss: She is currently taking Wegovy {emsideeffects:29751::"with adequate clinical response ","without side effects."}.    ASSESSMENT AND PLAN  TREATMENT PLAN FOR OBESITY:  Recommended Dietary  Goals  Willemina is currently in the action stage of change. As such, her goal is to continue weight management plan. She has agreed to: {EMWTLOSSPLAN:29297::"continue current plan"}  Behavioral Intervention  We discussed the following Behavioral Modification Strategies today: {EMWMwtlossstrategies:28914::"continue to work on maintaining a reduced calorie state, getting the recommended amount of protein, incorporating whole foods, making healthy choices, staying well hydrated and practicing mindfulness when eating."}.  Additional resources provided today: {EMadditionalresources:29169::"None"}  Recommended Physical Activity Goals  Latrece has been advised to work up to 150 minutes of moderate intensity aerobic activity a week and strengthening exercises 2-3 times per week for cardiovascular health, weight loss maintenance and preservation of muscle mass.   She has agreed to :  {EMEXERCISE:28847::"Think about enjoyable ways to increase daily physical activity and overcoming barriers to exercise","Increase physical activity in their day and reduce sedentary time (increase NEAT)."}  Pharmacotherapy We discussed various medication options to help Akire with her weight loss efforts and we both agreed to : {EMagreedrx:29170::"continue with nutritional and behavioral strategies"}  ASSOCIATED CONDITIONS ADDRESSED TODAY  Insulin resistance    PHYSICAL EXAM:  Blood pressure 138/84, pulse 87, temperature 97.7 F (36.5 C), height 5\' 3"  (1.6 m), weight 198 lb (89.8 kg), SpO2 98%. Body mass index is 35.07 kg/m.  General: She is overweight, cooperative, alert, well developed, and in no acute distress. PSYCH: Has normal mood, affect and thought process.   HEENT: EOMI, sclerae are anicteric. Lungs: Normal breathing effort, no conversational dyspnea. Extremities: No edema.  Neurologic: No gross sensory or motor deficits. No tremors or fasciculations noted.    DIAGNOSTIC DATA REVIEWED:  BMET  Component Value Date/Time   NA 138 06/29/2022 1050   K 3.8 06/29/2022 1050   CL 98 06/29/2022 1050   CO2 20 06/29/2022 1050   GLUCOSE 92 06/29/2022 1050   GLUCOSE 90 11/24/2021 1330   BUN 23 06/29/2022 1050   CREATININE 0.71 06/29/2022 1050   CREATININE 0.66 04/06/2016 0931   CALCIUM 10.3 06/29/2022 1050   GFRNONAA >60 06/20/2018 0419   GFRAA >60 06/20/2018 0419   Lab Results  Component Value Date   HGBA1C 5.3 06/29/2022   HGBA1C 4.9 06/15/2018   Lab Results  Component Value Date   INSULIN 32.5 (H) 06/29/2022   Lab Results  Component Value Date   TSH 1.25 11/21/2022   CBC    Component Value Date/Time   WBC 7.1 06/29/2022 1050   WBC 6.7 06/22/2018 0434   RBC 4.46 06/29/2022 1050   RBC 3.64 (L) 06/22/2018 0434   HGB 14.4 06/29/2022 1050   HCT 42.4 06/29/2022 1050   PLT 254 06/29/2022 1050   MCV 95 06/29/2022 1050   MCH 32.3 06/29/2022 1050   MCH 31.3 06/22/2018 0434   MCHC 34.0 06/29/2022 1050   MCHC 31.1 06/22/2018 0434   RDW 12.0 06/29/2022 1050   Iron Studies No results found for: "IRON", "TIBC", "FERRITIN", "IRONPCTSAT" Lipid Panel     Component Value Date/Time   CHOL 179 06/29/2022 1050   TRIG 129 06/29/2022 1050   HDL 57 06/29/2022 1050   LDLCALC 99 06/29/2022 1050   Hepatic Function Panel     Component Value Date/Time   PROT 7.2 06/29/2022 1050   ALBUMIN 4.7 06/29/2022 1050   AST 17 06/29/2022 1050   ALT 19 06/29/2022 1050   ALKPHOS 56 06/29/2022 1050   BILITOT 0.3 06/29/2022 1050      Component Value Date/Time   TSH 1.25 11/21/2022 1550   Nutritional Lab Results  Component Value Date   VD25OH 42.1 06/29/2022     No follow-ups on file.Marland Kitchen She was informed of the importance of frequent follow up visits to maximize her success with intensive lifestyle modifications for her multiple health conditions.   ATTESTASTION STATEMENTS:  Reviewed by clinician on day of visit: allergies, medications, problem list, medical history, surgical  history, family history, social history, and previous encounter notes.     Worthy Rancher, MD

## 2023-04-11 ENCOUNTER — Other Ambulatory Visit (HOSPITAL_COMMUNITY): Payer: Self-pay

## 2023-04-11 NOTE — Assessment & Plan Note (Signed)
 See obesity treatment plan

## 2023-04-11 NOTE — Assessment & Plan Note (Signed)
Her HOMA-IR is 8 which is significant insulin resistance. Optimal level < 1.9. This is complex condition associated with genetics, ectopic fat and lifestyle factors. Insulin resistance may result in weight gain, abnormal cravings (particularly for carbs) and fatigue. This may result in additional weight gain and lead to pre-diabetes and diabetes if untreated.   Lab Results  Component Value Date   HGBA1C 5.3 06/29/2022   Lab Results  Component Value Date   INSULIN 32.5 (H) 06/29/2022   Lab Results  Component Value Date   GLUCOSE 92 06/29/2022   GLUCOSE 101 (H) 03/22/2016   She will continue on metformin and Wegovy for pharmacoprophylaxis.  She will continue to work on reducing simple and added sugars in her diet.

## 2023-04-14 ENCOUNTER — Other Ambulatory Visit (HOSPITAL_COMMUNITY): Payer: Self-pay

## 2023-04-17 ENCOUNTER — Encounter (INDEPENDENT_AMBULATORY_CARE_PROVIDER_SITE_OTHER): Payer: Self-pay | Admitting: Internal Medicine

## 2023-04-17 ENCOUNTER — Ambulatory Visit (INDEPENDENT_AMBULATORY_CARE_PROVIDER_SITE_OTHER): Payer: Medicare Other | Admitting: Internal Medicine

## 2023-04-17 DIAGNOSIS — R110 Nausea: Secondary | ICD-10-CM

## 2023-04-17 MED ORDER — ONDANSETRON 4 MG PO TBDP
4.0000 mg | ORAL_TABLET | Freq: Three times a day (TID) | ORAL | 0 refills | Status: DC | PRN
Start: 2023-04-17 — End: 2023-05-15

## 2023-04-20 ENCOUNTER — Telehealth (INDEPENDENT_AMBULATORY_CARE_PROVIDER_SITE_OTHER): Payer: Self-pay

## 2023-04-20 NOTE — Telephone Encounter (Signed)
Pt reports to be much better, no nausea.

## 2023-05-08 ENCOUNTER — Ambulatory Visit (INDEPENDENT_AMBULATORY_CARE_PROVIDER_SITE_OTHER): Payer: Medicare Other | Admitting: Internal Medicine

## 2023-05-10 ENCOUNTER — Other Ambulatory Visit: Payer: Self-pay

## 2023-05-10 ENCOUNTER — Other Ambulatory Visit (HOSPITAL_COMMUNITY): Payer: Self-pay

## 2023-05-15 ENCOUNTER — Encounter (INDEPENDENT_AMBULATORY_CARE_PROVIDER_SITE_OTHER): Payer: Self-pay | Admitting: Internal Medicine

## 2023-05-15 ENCOUNTER — Other Ambulatory Visit (HOSPITAL_COMMUNITY): Payer: Self-pay

## 2023-05-15 ENCOUNTER — Ambulatory Visit (INDEPENDENT_AMBULATORY_CARE_PROVIDER_SITE_OTHER): Payer: Medicare Other | Admitting: Internal Medicine

## 2023-05-15 VITALS — BP 136/86 | HR 80 | Temp 97.8°F | Ht 63.0 in | Wt 195.0 lb

## 2023-05-15 DIAGNOSIS — R638 Other symptoms and signs concerning food and fluid intake: Secondary | ICD-10-CM | POA: Diagnosis not present

## 2023-05-15 DIAGNOSIS — R110 Nausea: Secondary | ICD-10-CM

## 2023-05-15 DIAGNOSIS — Z6837 Body mass index (BMI) 37.0-37.9, adult: Secondary | ICD-10-CM

## 2023-05-15 DIAGNOSIS — E66812 Obesity, class 2: Secondary | ICD-10-CM

## 2023-05-15 DIAGNOSIS — E88819 Insulin resistance, unspecified: Secondary | ICD-10-CM | POA: Diagnosis not present

## 2023-05-15 MED ORDER — SEMAGLUTIDE-WEIGHT MANAGEMENT 1.7 MG/0.75ML ~~LOC~~ SOAJ
1.7000 mg | SUBCUTANEOUS | 0 refills | Status: DC
  Filled 2023-05-15: qty 3, 28d supply, fill #0

## 2023-05-15 MED ORDER — METFORMIN HCL ER 500 MG PO TB24
500.0000 mg | ORAL_TABLET | Freq: Two times a day (BID) | ORAL | 0 refills | Status: DC
  Filled 2023-05-15: qty 60, 30d supply, fill #0

## 2023-05-15 MED ORDER — ONDANSETRON 4 MG PO TBDP
4.0000 mg | ORAL_TABLET | Freq: Three times a day (TID) | ORAL | 0 refills | Status: DC | PRN
  Filled 2023-05-15: qty 30, 10d supply, fill #0

## 2023-05-15 MED ORDER — METFORMIN HCL ER 500 MG PO TB24
500.0000 mg | ORAL_TABLET | Freq: Two times a day (BID) | ORAL | 0 refills | Status: DC
  Filled 2023-05-15: qty 180, 90d supply, fill #0

## 2023-05-15 NOTE — Progress Notes (Signed)
Office: [phone]  /  Fax: [phone]  Weight Summary And Biometrics  Vitals Temp: 97.8 F (36.6 C) BP: 136/86 Pulse Rate: 80 SpO2: 97 %   Anthropometric Measurements Height: 5\' 3"  (1.6 m) Weight: 195 lb (88.5 kg) BMI (Calculated): 34.55 Weight at Last Visit: 198 lb Weight Lost Since Last Visit: 3 lb Weight Gained Since Last Visit: 0 lb Starting Weight: 224 lb Total Weight Loss (lbs): 29 lb (13.2 kg) Peak Weight: 230 lb   Body Composition  Body Fat %: 47.7 % Fat Mass (lbs): 93.2 lbs Muscle Mass (lbs): 96.8 lbs Total Body Water (lbs): 73 lbs Visceral Fat Rating : 15    No data recorded Today's Visit #: 14  Starting Date: 06/29/22   Subjective   Chief Complaint: Obesity  Shannon Reyes is here to discuss her progress with her obesity treatment plan. She is on the following a lower carbohydrate, vegetable and lean protein rich diet plan and states she is following her eating plan approximately 95 % of the time. She states she is exercising 20 minutes 4-5 times per week.  Interval History:   Since last office visit she has lost 3 pounds. She reports good adherence to reduced calorie nutritional plan. She has been working on reading food labels, not skipping meals, increasing protein intake at every meal, drinking more water, making healthier choices, reducing portion sizes, and incorporating more whole foods.  She recently had to stop medication for bladder procedure.  Orexigenic Control:  Reports problems with appetite and hunger signals. Has noticed waning effect end of the week Denies problems with satiety and satiation.  Denies problems with eating patterns and portion control.  Denies abnormal cravings. Denies feeling deprived or restricted.   Barriers identified: none.   Pharmacotherapy for weight loss: She is currently taking Wegovy with adequate clinical response  and experiencing the following side effects: Mild nausea and constipation nausea resolved  with the use of ondansetron..   Assessment and Plan   Treatment Plan For Obesity:  Recommended Dietary Goals  Shannon Reyes is currently in the action stage of change. As such, her goal is to continue weight management plan. She has agreed to: continue current plan  Behavioral Intervention  We discussed the following Behavioral Modification Strategies today: continue to work on maintaining a reduced calorie state, getting the recommended amount of protein, incorporating whole foods, making healthy choices, staying well hydrated and practicing mindfulness when eating..  Additional resources provided today: None  Recommended Physical Activity Goals  Shannon Reyes has been advised to work up to 150 minutes of moderate intensity aerobic activity a week and strengthening exercises 2-3 times per week for cardiovascular health, weight loss maintenance and preservation of muscle mass.   She has agreed to :  continue to gradually increase the amount and intensity of exercise   Pharmacotherapy  We discussed various medication options to help Shannon Reyes with her weight loss efforts and we both agreed to : increase Wegovy to 1.7 mg once a week  Associated Conditions Addressed Today  Abnormal food appetite Assessment & Plan: Improved.  She has amplified orixegenic signaling, impaired satiety and inhibitory control. This is secondary to an abnormal energy regulation system and pathological neurohormonal pathways characteristic of excess adiposity.  We will increase Wegovy to 1.7 mg once a week  Orders: -     Semaglutide-Weight Management; Inject 1.7 mg into the skin once a week for 28 days.  Dispense: 3 mL; Refill: 0  Insulin resistance Assessment & Plan: Her HOMA-IR is  8 which is significant insulin resistance. Optimal level < 1.9.   Lab Results  Component Value Date   HGBA1C 5.3 06/29/2022   Lab Results  Component Value Date   INSULIN 32.5 (H) 06/29/2022   Lab Results  Component Value Date    GLUCOSE 92 06/29/2022   GLUCOSE 101 (H) 03/22/2016   She will continue on metformin and Wegovy for pharmacoprophylaxis.  We will repeat glycemic serologies at the beginning of next year.  Continue diet and low in simple and added sugars.   Orders: -     metFORMIN HCl ER; Take 1 tablet (500 mg total) by mouth 2 (two) times daily with a meal.  Dispense: 180 tablet; Refill: 0  Class 2 severe obesity due to excess calories with serious comorbidity and body mass index (BMI) of 37.0 to 37.9 in adult [facility]) Assessment & Plan: See obesity treatment plan  Patient has to switch to a different weight management model in January to be able to obtain GLP-1 therapy through her employer.  She will be obtaining additional information and will let us know how we could help her.  Orders: -     Semaglutide-Weight Management; Inject 1.7 mg into the skin once a week for 28 days.  Dispense: 3 mL; Refill: 0  Nausea -     Ondansetron; Take 1 tablet (4 mg total) by mouth every 8 (eight) hours as needed for nausea or vomiting.  Dispense: 30 tablet; Refill: 0     Objective   Physical Exam:  Blood pressure 136/86, pulse 80, temperature 97.8 F (36.6 C), height 5\' 3"  (1.6 m), weight 195 lb (88.5 kg), SpO2 97%. Body mass index is 34.54 kg/m.  General: She is overweight, cooperative, alert, well developed, and in no acute distress. PSYCH: Has normal mood, affect and thought process.   HEENT: EOMI, sclerae are anicteric. Lungs: Normal breathing effort, no conversational dyspnea. Extremities: No edema.  Neurologic: No gross sensory or motor deficits. No tremors or fasciculations noted.    Diagnostic Data Reviewed:  BMET    Component Value Date/Time   NA 138 06/29/2022 1050   K 3.8 06/29/2022 1050   CL 98 06/29/2022 1050   CO2 20 06/29/2022 1050   GLUCOSE 92 06/29/2022 1050   GLUCOSE 90 11/24/2021 1330   BUN 23 06/29/2022 1050   CREATININE 0.71 06/29/2022 1050   CREATININE 0.66 04/06/2016 0931    CALCIUM 10.3 06/29/2022 1050   GFRNONAA >60 06/20/2018 0419   GFRAA >60 06/20/2018 0419   Lab Results  Component Value Date   HGBA1C 5.3 06/29/2022   HGBA1C 4.9 06/15/2018   Lab Results  Component Value Date   INSULIN 32.5 (H) 06/29/2022   Lab Results  Component Value Date   TSH 1.25 11/21/2022   CBC    Component Value Date/Time   WBC 7.1 06/29/2022 1050   WBC 6.7 06/22/2018 0434   RBC 4.46 06/29/2022 1050   RBC 3.64 (L) 06/22/2018 0434   HGB 14.4 06/29/2022 1050   HCT 42.4 06/29/2022 1050   PLT 254 06/29/2022 1050   MCV 95 06/29/2022 1050   MCH 32.3 06/29/2022 1050   MCH 31.3 06/22/2018 0434   MCHC 34.0 06/29/2022 1050   MCHC 31.1 06/22/2018 0434   RDW 12.0 06/29/2022 1050   Iron Studies No results found for: "IRON", "TIBC", "FERRITIN", "IRONPCTSAT" Lipid Panel     Component Value Date/Time   CHOL 179 06/29/2022 1050   TRIG 129 06/29/2022 1050   HDL 57 06/29/2022  1050   LDLCALC 99 06/29/2022 1050   Hepatic Function Panel     Component Value Date/Time   PROT 7.2 06/29/2022 1050   ALBUMIN 4.7 06/29/2022 1050   AST 17 06/29/2022 1050   ALT 19 06/29/2022 1050   ALKPHOS 56 06/29/2022 1050   BILITOT 0.3 06/29/2022 1050      Component Value Date/Time   TSH 1.25 11/21/2022 1550   Nutritional Lab Results  Component Value Date   VD25OH 42.1 06/29/2022    Follow-Up   Return in about 4 weeks (around 06/12/2023) for For Weight Mangement with Dr. Rikki Spearing.Marland Kitchen She was informed of the importance of frequent follow up visits to maximize her success with intensive lifestyle modifications for her multiple health conditions.  Attestation Statement   Reviewed by clinician on day of visit: allergies, medications, problem list, medical history, surgical history, family history, social history, and previous encounter notes.     Worthy Rancher, MD

## 2023-05-15 NOTE — Assessment & Plan Note (Signed)
See obesity treatment plan  Patient has to switch to a different weight management model in January to be able to obtain GLP-1 therapy through her employer.  She will be obtaining additional information and will let us know how we could help her.

## 2023-05-15 NOTE — Assessment & Plan Note (Signed)
Improved.  She has amplified orixegenic signaling, impaired satiety and inhibitory control. This is secondary to an abnormal energy regulation system and pathological neurohormonal pathways characteristic of excess adiposity.  We will increase Wegovy to 1.7 mg once a week

## 2023-05-15 NOTE — Assessment & Plan Note (Signed)
Her HOMA-IR is 8 which is significant insulin resistance. Optimal level < 1.9.   Lab Results  Component Value Date   HGBA1C 5.3 06/29/2022   Lab Results  Component Value Date   INSULIN 32.5 (H) 06/29/2022   Lab Results  Component Value Date   GLUCOSE 92 06/29/2022   GLUCOSE 101 (H) 03/22/2016   She will continue on metformin and Wegovy for pharmacoprophylaxis.  We will repeat glycemic serologies at the beginning of next year.  Continue diet and low in simple and added sugars.

## 2023-05-16 ENCOUNTER — Other Ambulatory Visit (HOSPITAL_COMMUNITY): Payer: Self-pay

## 2023-05-17 ENCOUNTER — Other Ambulatory Visit (HOSPITAL_COMMUNITY): Payer: Self-pay

## 2023-05-17 ENCOUNTER — Telehealth (INDEPENDENT_AMBULATORY_CARE_PROVIDER_SITE_OTHER): Payer: Self-pay

## 2023-05-17 NOTE — Telephone Encounter (Signed)
RE: Melina Fiddler approved your request for coverage of Wegovy 1.7MG /0.75ML [place] [employer]. Dear Lupita Leash Maurice March: Verlon Au pleased to let you know that we've approved your or your doctor's request for coverage for Wegovy 1.7MG /0.75ML [place] [employer]. You can now fill your prescription, and it will be covered according to your plan. As long as you remain covered by your prescription drug plan and there are no changes to your plan benefits, this request is approved from 05/17/2023 to 05/16/2024.  Patient notified.

## 2023-05-17 NOTE — Telephone Encounter (Signed)
Prior auth started for [facility]. Awaiting determination.

## 2023-05-19 ENCOUNTER — Other Ambulatory Visit (HOSPITAL_COMMUNITY): Payer: Self-pay

## 2023-05-25 ENCOUNTER — Other Ambulatory Visit: Payer: Self-pay | Admitting: Family Medicine

## 2023-05-25 DIAGNOSIS — E039 Hypothyroidism, unspecified: Secondary | ICD-10-CM

## 2023-05-27 ENCOUNTER — Other Ambulatory Visit: Payer: Self-pay | Admitting: Family Medicine

## 2023-05-27 DIAGNOSIS — I10 Essential (primary) hypertension: Secondary | ICD-10-CM

## 2023-06-01 ENCOUNTER — Ambulatory Visit (INDEPENDENT_AMBULATORY_CARE_PROVIDER_SITE_OTHER): Payer: Medicare Other | Admitting: Internal Medicine

## 2023-06-01 ENCOUNTER — Encounter (INDEPENDENT_AMBULATORY_CARE_PROVIDER_SITE_OTHER): Payer: Self-pay | Admitting: Internal Medicine

## 2023-06-01 ENCOUNTER — Other Ambulatory Visit (HOSPITAL_COMMUNITY): Payer: Self-pay

## 2023-06-01 DIAGNOSIS — R110 Nausea: Secondary | ICD-10-CM | POA: Diagnosis not present

## 2023-06-01 DIAGNOSIS — R638 Other symptoms and signs concerning food and fluid intake: Secondary | ICD-10-CM

## 2023-06-01 DIAGNOSIS — E88819 Insulin resistance, unspecified: Secondary | ICD-10-CM

## 2023-06-01 DIAGNOSIS — E66812 Obesity, class 2: Secondary | ICD-10-CM

## 2023-06-01 DIAGNOSIS — Z6834 Body mass index (BMI) 34.0-34.9, adult: Secondary | ICD-10-CM

## 2023-06-01 MED ORDER — ONDANSETRON 4 MG PO TBDP: 4.0000 mg | ORAL_TABLET | Freq: Three times a day (TID) | ORAL | 0 refills | Status: DC | PRN

## 2023-06-01 MED ORDER — SEMAGLUTIDE-WEIGHT MANAGEMENT 1.7 MG/0.75ML ~~LOC~~ SOAJ
1.7000 mg | SUBCUTANEOUS | 1 refills | Status: DC
  Filled 2023-06-01 – 2023-06-12 (×2): qty 3, 28d supply, fill #0

## 2023-06-01 MED ORDER — METFORMIN HCL ER 500 MG PO TB24: 500.0000 mg | ORAL_TABLET | Freq: Two times a day (BID) | ORAL | 0 refills | Status: DC

## 2023-06-01 NOTE — Progress Notes (Signed)
Office: [phone]  /  Fax: [phone]  Weight Summary And Biometrics  Vitals Temp: (!) 97.5 F (36.4 C) BP: 129/80 Pulse Rate: 82 SpO2: 100 %   Anthropometric Measurements Height: 5\' 3"  (1.6 m) Weight: 192 lb (87.1 kg) BMI (Calculated): 34.02 Weight at Last Visit: 195 lb Weight Lost Since Last Visit: 3 lb Weight Gained Since Last Visit: 0 Starting Weight: 224 lb Total Weight Loss (lbs): 32 lb (14.5 kg) Peak Weight: 230 lb   Body Composition  Body Fat %: 47.5 % Fat Mass (lbs): 91.4 lbs Muscle Mass (lbs): 96.6 lbs Total Body Water (lbs): 71.6 lbs Visceral Fat Rating : 14    No data recorded Today's Visit #: 15  Starting Date: 06/29/22   Subjective   Chief Complaint: Obesity  Shannon Reyes is here to discuss her progress with her obesity treatment plan. She is on the the Category 2 Plan and states she is following her eating plan approximately 100 % of the time. She states she is exercising, walking her dogs 15-20 minutes 4 times per week.  Interval History:   Discussed the use of AI scribe software for clinical note transcription with the patient, who gave verbal consent to proceed.  History of Present Illness   The patient, a 72 year old individual with a history of obesity and insulin resistance, presents today for medical weight management. She is currently on Wegovy 1.7mg  once a week and Metformin XR 500mg  twice a day for insulin resistance and weight management. The patient reports a significant weight loss of 34 pounds since July, from 226 pounds to 192 pounds. She attributes this weight loss to a change in dietary habits, including increased protein intake and reduced fast food consumption. She has also noticed an improvement in physical activity, including increased walking distances with her dogs.  The patient reports a decrease in appetite and a sense of fullness, which she attributes to the [facility] medication. She notes a significant improvement with the  1.7mg  dose, with fewer side effects such as nausea compared to previous doses. She also reports that the medication has helped control her hunger signals.  The patient has been focusing on maintaining muscle mass during her weight loss journey, with a particular emphasis on protein intake. She has been diligent in consuming protein-rich foods and has found new dietary options to keep her protein intake high. She has noticed a change in body composition, with a decrease in body fat percentage and no loss of muscle mass based on BIA information.  The patient also reports an improvement in physical activity, with increased walking distances and a noticeable difference in her ability to climb stairs without becoming out of breath. She expresses a desire to increase her physical activity further and is considering joining a fitness center.  The patient is satisfied with her progress and is looking forward to continuing her weight loss journey in the coming year. She expresses a positive outlook and a strong motivation to maintain her new dietary and exercise habits.       Stress levels are reported as low and manageable.   Orexigenic Control:  Denies problems with appetite and hunger signals.  Denies problems with satiety and satiation.  Denies problems with eating patterns and portion control.  Denies abnormal cravings. Denies feeling deprived or restricted.   Barriers identified: none.   Pharmacotherapy for weight loss: She is currently taking Wegovy with adequate clinical response  and without side effects..   Assessment and Plan   Treatment Plan  For Obesity:  Recommended Dietary Goals  Shannon Reyes is currently in the action stage of change. As such, her goal is to continue weight management plan. She has agreed to: continue current plan  Behavioral Intervention  We discussed the following Behavioral Modification Strategies today: continue to work on maintaining a reduced calorie state,  getting the recommended amount of protein, incorporating whole foods, making healthy choices, staying well hydrated and practicing mindfulness when eating..  Additional resources provided today: None  Recommended Physical Activity Goals  Shannon Reyes has been advised to work up to 150 minutes of moderate intensity aerobic activity a week and strengthening exercises 2-3 times per week for cardiovascular health, weight loss maintenance and preservation of muscle mass.   She has agreed to :  Think about enjoyable ways to increase daily physical activity and overcoming barriers to exercise and Increase physical activity in their day and reduce sedentary time (increase NEAT).  Pharmacotherapy  We discussed various medication options to help Shannon Reyes with her weight loss efforts and we both agreed to : continue current anti-obesity medication regimen  Associated Conditions Addressed and Impacted by Obesity Treatment  Nausea Assessment & Plan: Stable, secondary to GLP-1 therapy has not needed to use ondansetron often.  Medication will be refilled.  We have reviewed nutritional ways to avoid common gastrointestinal side effects.  Orders: -     Ondansetron; Take 1 tablet (4 mg total) by mouth every 8 (eight) hours as needed for nausea or vomiting.  Dispense: 30 tablet; Refill: 0  Class 2 severe obesity due to excess calories with serious comorbidity and body mass index (BMI) of 37.0 to 37.9 in adult [facility]) Assessment & Plan: Shannon Reyes has been coming to our program for 11 months she has lost 31 pounds with most of her weight loss starting after initiation of GLP-1 in July.  She was initially losing weight quickly and was losing muscle so we had advised on increasing caloric intake and also engaging in strengthening exercises.  Over the last 2 months she has not lost muscle mass and BIA scale continues to show reductions in body fat percentage.  I will like for her to maintain a weight loss of 1 pound per week to  reduce the risk of sarcopenia.  I do not recommend increasing Wegovy further she is having an adequate clinical response at current dose.  She will be transitioning to an employer based model possibly in March.  Orders: -     Semaglutide-Weight Management; Inject 1.7 mg into the skin once a week.  Dispense: 3 mL; Refill: 1  Abnormal food appetite Assessment & Plan: Improved on GLP-1.  She has amplified orixegenic signaling, impaired satiety and inhibitory control. This is secondary to an abnormal energy regulation system and pathological neurohormonal pathways characteristic of excess adiposity.  She also has hyperinsulinemia which may drive cravings for sweets.  She will continue Wegovy at 1.7 mg once a week and maintain a diet with a low glycemic load.  Orders: -     Semaglutide-Weight Management; Inject 1.7 mg into the skin once a week.  Dispense: 3 mL; Refill: 1  Insulin resistance Assessment & Plan: Her HOMA-IR is 8 which is significant insulin resistance. Optimal level < 1.9.   Lab Results  Component Value Date   HGBA1C 5.3 06/29/2022   Lab Results  Component Value Date   INSULIN 32.5 (H) 06/29/2022   Lab Results  Component Value Date   GLUCOSE 92 06/29/2022   GLUCOSE 101 (H) 03/22/2016  She will continue on metformin and Wegovy for pharmacoprophylaxis.  She has been educated on the carb insulin model and the importance of maintaining a diet with a low glycemic load.    Orders: -     metFORMIN HCl ER; Take 1 tablet (500 mg total) by mouth 2 (two) times daily with a meal.  Dispense: 180 tablet; Refill: 0     Objective   Physical Exam:  Blood pressure 129/80, pulse 82, temperature (!) 97.5 F (36.4 C), height 5\' 3"  (1.6 m), weight 192 lb (87.1 kg), SpO2 100%. Body mass index is 34.01 kg/m.  General: She is overweight, cooperative, alert, well developed, and in no acute distress. PSYCH: Has normal mood, affect and thought process.   HEENT: EOMI, sclerae are  anicteric. Lungs: Normal breathing effort, no conversational dyspnea. Extremities: No edema.  Neurologic: No gross sensory or motor deficits. No tremors or fasciculations noted.    Diagnostic Data Reviewed:  BMET    Component Value Date/Time   NA 138 06/29/2022 1050   K 3.8 06/29/2022 1050   CL 98 06/29/2022 1050   CO2 20 06/29/2022 1050   GLUCOSE 92 06/29/2022 1050   GLUCOSE 90 11/24/2021 1330   BUN 23 06/29/2022 1050   CREATININE 0.71 06/29/2022 1050   CREATININE 0.66 04/06/2016 0931   CALCIUM 10.3 06/29/2022 1050   GFRNONAA >60 06/20/2018 0419   GFRAA >60 06/20/2018 0419   Lab Results  Component Value Date   HGBA1C 5.3 06/29/2022   HGBA1C 4.9 06/15/2018   Lab Results  Component Value Date   INSULIN 32.5 (H) 06/29/2022   Lab Results  Component Value Date   TSH 1.25 11/21/2022   CBC    Component Value Date/Time   WBC 7.1 06/29/2022 1050   WBC 6.7 06/22/2018 0434   RBC 4.46 06/29/2022 1050   RBC 3.64 (L) 06/22/2018 0434   HGB 14.4 06/29/2022 1050   HCT 42.4 06/29/2022 1050   PLT 254 06/29/2022 1050   MCV 95 06/29/2022 1050   MCH 32.3 06/29/2022 1050   MCH 31.3 06/22/2018 0434   MCHC 34.0 06/29/2022 1050   MCHC 31.1 06/22/2018 0434   RDW 12.0 06/29/2022 1050   Iron Studies No results found for: "IRON", "TIBC", "FERRITIN", "IRONPCTSAT" Lipid Panel     Component Value Date/Time   CHOL 179 06/29/2022 1050   TRIG 129 06/29/2022 1050   HDL 57 06/29/2022 1050   LDLCALC 99 06/29/2022 1050   Hepatic Function Panel     Component Value Date/Time   PROT 7.2 06/29/2022 1050   ALBUMIN 4.7 06/29/2022 1050   AST 17 06/29/2022 1050   ALT 19 06/29/2022 1050   ALKPHOS 56 06/29/2022 1050   BILITOT 0.3 06/29/2022 1050      Component Value Date/Time   TSH 1.25 11/21/2022 1550   Nutritional Lab Results  Component Value Date   VD25OH 42.1 06/29/2022    Follow-Up   No follow-ups on file.Marland Kitchen She was informed of the importance of frequent follow up visits to  maximize her success with intensive lifestyle modifications for her multiple health conditions.  Attestation Statement   Reviewed by clinician on day of visit: allergies, medications, problem list, medical history, surgical history, family history, social history, and previous encounter notes.     Worthy Rancher, MD

## 2023-06-02 NOTE — Assessment & Plan Note (Signed)
Shannon Reyes has been coming to our program for 11 months she has lost 31 pounds with most of her weight loss starting after initiation of GLP-1 in July.  She was initially losing weight quickly and was losing muscle so we had advised on increasing caloric intake and also engaging in strengthening exercises.  Over the last 2 months she has not lost muscle mass and BIA scale continues to show reductions in body fat percentage.  I will like for her to maintain a weight loss of 1 pound per week to reduce the risk of sarcopenia.  I do not recommend increasing Wegovy further she is having an adequate clinical response at current dose.  She will be transitioning to an employer based model possibly in March.

## 2023-06-02 NOTE — Assessment & Plan Note (Signed)
Improved on GLP-1.  She has amplified orixegenic signaling, impaired satiety and inhibitory control. This is secondary to an abnormal energy regulation system and pathological neurohormonal pathways characteristic of excess adiposity.  She also has hyperinsulinemia which may drive cravings for sweets.  She will continue Wegovy at 1.7 mg once a week and maintain a diet with a low glycemic load.

## 2023-06-02 NOTE — Assessment & Plan Note (Signed)
Her HOMA-IR is 8 which is significant insulin resistance. Optimal level < 1.9.   Lab Results  Component Value Date   HGBA1C 5.3 06/29/2022   Lab Results  Component Value Date   INSULIN 32.5 (H) 06/29/2022   Lab Results  Component Value Date   GLUCOSE 92 06/29/2022   GLUCOSE 101 (H) 03/22/2016   She will continue on metformin and Wegovy for pharmacoprophylaxis.  She has been educated on the carb insulin model and the importance of maintaining a diet with a low glycemic load.

## 2023-06-02 NOTE — Assessment & Plan Note (Signed)
Stable, secondary to GLP-1 therapy has not needed to use ondansetron often.  Medication will be refilled.  We have reviewed nutritional ways to avoid common gastrointestinal side effects.

## 2023-06-12 ENCOUNTER — Other Ambulatory Visit (HOSPITAL_COMMUNITY): Payer: Self-pay

## 2023-06-12 ENCOUNTER — Other Ambulatory Visit (INDEPENDENT_AMBULATORY_CARE_PROVIDER_SITE_OTHER): Payer: Self-pay | Admitting: Family Medicine

## 2023-06-12 DIAGNOSIS — E88819 Insulin resistance, unspecified: Secondary | ICD-10-CM

## 2023-06-13 ENCOUNTER — Other Ambulatory Visit (HOSPITAL_COMMUNITY): Payer: Self-pay

## 2023-06-13 ENCOUNTER — Other Ambulatory Visit (INDEPENDENT_AMBULATORY_CARE_PROVIDER_SITE_OTHER): Payer: Self-pay | Admitting: Family Medicine

## 2023-06-13 DIAGNOSIS — E88819 Insulin resistance, unspecified: Secondary | ICD-10-CM

## 2023-06-27 ENCOUNTER — Ambulatory Visit (INDEPENDENT_AMBULATORY_CARE_PROVIDER_SITE_OTHER): Payer: Medicare Other | Admitting: Internal Medicine

## 2023-07-11 ENCOUNTER — Other Ambulatory Visit (HOSPITAL_COMMUNITY): Payer: Self-pay

## 2023-07-11 ENCOUNTER — Ambulatory Visit (INDEPENDENT_AMBULATORY_CARE_PROVIDER_SITE_OTHER): Payer: Medicare Other | Admitting: Internal Medicine

## 2023-07-11 ENCOUNTER — Encounter: Payer: Self-pay | Admitting: Physician Assistant

## 2023-07-11 ENCOUNTER — Ambulatory Visit: Payer: No Typology Code available for payment source | Admitting: Physician Assistant

## 2023-07-11 ENCOUNTER — Encounter (INDEPENDENT_AMBULATORY_CARE_PROVIDER_SITE_OTHER): Payer: Self-pay | Admitting: Internal Medicine

## 2023-07-11 VITALS — BP 132/78 | HR 84 | Temp 98.2°F | Ht 63.0 in | Wt 184.0 lb

## 2023-07-11 VITALS — BP 138/76 | HR 90 | Ht 63.0 in | Wt 187.0 lb

## 2023-07-11 DIAGNOSIS — Z1212 Encounter for screening for malignant neoplasm of rectum: Secondary | ICD-10-CM

## 2023-07-11 DIAGNOSIS — Z9049 Acquired absence of other specified parts of digestive tract: Secondary | ICD-10-CM

## 2023-07-11 DIAGNOSIS — R638 Other symptoms and signs concerning food and fluid intake: Secondary | ICD-10-CM | POA: Diagnosis not present

## 2023-07-11 DIAGNOSIS — Z6837 Body mass index (BMI) 37.0-37.9, adult: Secondary | ICD-10-CM | POA: Diagnosis not present

## 2023-07-11 DIAGNOSIS — E66812 Obesity, class 2: Secondary | ICD-10-CM | POA: Diagnosis not present

## 2023-07-11 DIAGNOSIS — R1084 Generalized abdominal pain: Secondary | ICD-10-CM

## 2023-07-11 DIAGNOSIS — K219 Gastro-esophageal reflux disease without esophagitis: Secondary | ICD-10-CM | POA: Diagnosis not present

## 2023-07-11 DIAGNOSIS — E88819 Insulin resistance, unspecified: Secondary | ICD-10-CM | POA: Diagnosis not present

## 2023-07-11 MED ORDER — DICYCLOMINE HCL 10 MG PO CAPS: 10.0000 mg | ORAL_CAPSULE | Freq: Three times a day (TID) | ORAL | 3 refills | Status: DC

## 2023-07-11 MED ORDER — OMEPRAZOLE 20 MG PO CPDR: 20.0000 mg | DELAYED_RELEASE_CAPSULE | Freq: Every day | ORAL | 3 refills | Status: DC

## 2023-07-11 MED ORDER — SEMAGLUTIDE-WEIGHT MANAGEMENT 1.7 MG/0.75ML ~~LOC~~ SOAJ
1.7000 mg | SUBCUTANEOUS | 1 refills | Status: DC
  Filled 2023-07-11: qty 3, 28d supply, fill #0

## 2023-07-11 NOTE — Progress Notes (Signed)
Chief Complaint: Follow-up GERD  HPI:    Shannon Reyes is a 73 year old female with a past medical history as listed below including anxiety, depression and GERD, established with Dr. Adela Lank, who presents to clinic today for follow-up of her GERD.    05/03/2018 CT of the abdomen pelvis with contrast for abdominal pain and nausea with acute diverticulitis of the splenic flexure, improved diverticulitis of the sigmoid colon, urinary tract unremarkable, aortic atherosclerosis, hysterectomy and small fat-containing periumbilical hernia.     04/22/2022 patient seen in clinic by me, discussed history of partial colectomy in 2020 due to recurrent diverticulitis that time describing some reflux symptoms as well as abdominal pain she was scheduled for a CT and discussed possible EGD colonoscopy.  Recommend she stop her magnesium which could cause frequent bowel movements.  Added Omeprazole 20 mg daily and Dicyclomine 10 mg 4 times daily.    Today, patient tells me she is doing really well as long as she uses her Dicyclomine 10 mg 4 times daily and Omeprazole 20 mg daily.  She would like a 90-day refill of these medications.  Does tell me she was recently started on Wegovy and has lost 40 pounds.  She is feeling good.  Does have occasional constipation with the [facility] but the Dicyclomine seems to help with this.    Denies fever, chills or blood in her stool.  Previous GI workup: 08/18/2014 colonoscopy with Dr. Matthias Hughs showed one 6 mm polyp at the hepatic flexure, melanotic mucosa in the entire colon and diverticulosis.   08/18/2014 biopsies show hyperplastic colon polyp and melanosis coli-repeat colonoscopy recommended in 10 years   02/13/2013 EGD with Dr. Matthias Hughs for dysphagia and chest pain was normal.   No change in plans.  Repeat colonoscopy due in 2026 for screening.    Past Medical History:  Diagnosis Date   ADD (attention deficit disorder)    Anxiety    Depression    Diverticulitis     Diverticulosis    Edema of both lower extremities    GERD (gastroesophageal reflux disease) YRS AGO   Hypertension    Hypothyroidism    IBS (irritable bowel syndrome)    Obesity    Thyroid disease     Past Surgical History:  Procedure Laterality Date   ABDOMINAL HYSTERECTOMY  1981   COMPLETE   ANKLE SURGERY Right 2004/2014   BLEPHAROPLASTY Left    CATARACT EXTRACTION Bilateral    LEFT OCT 2018, RIGHT NOV 2019   CESAREAN SECTION  1977 AND 1980   COLON RESECTION Left 06/19/2018   Procedure: LAPAROSCOPIC ASSISTED  LEFT COLON RESECTION;  Surgeon: Luretha Murphy, MD;  Location: [facility];  Service: General;  Laterality: Left;  ERAS PATHWAY   CYSTOSCOPY WITH STENT PLACEMENT Bilateral 06/19/2018   Procedure: CYSTOSCOPY WITH BILATERAL URETERAL CATHETER PLACEMENT;  Surgeon: Bjorn Pippin, MD;  Location: [facility];  Service: Urology;  Laterality: Bilateral;   OVARIAN CYST REMOVAL      Current Outpatient Medications  Medication Sig Dispense Refill   ARMOUR THYROID 15 MG tablet TAKE 1 TABLET BY MOUTH DAILY BEFORE BREAKFAST 90 tablet 3   buPROPion (WELLBUTRIN XL) 300 MG 24 hr tablet Take 300 mg by mouth daily.     chlorthalidone (HYGROTON) 25 MG tablet TAKE 1 TABLET BY MOUTH DAILY 90 tablet 3   dicyclomine (BENTYL) 10 MG capsule Take 1 capsule (10 mg total) by mouth 4 (four) times daily -  before meals and at bedtime. Patient needs follow up appointment for  future refills. Please call [phone] to schedule an appointment. 120 capsule 2   estradiol (ESTRACE) 1 MG tablet Take 1 tablet (1 mg total) by mouth daily. 90 tablet 3   EVEKEO 10 MG TABS Take 10 mg by mouth daily as needed (ADHD).   0   lisinopril (ZESTRIL) 40 MG tablet Take 1 tablet (40 mg total) by mouth 2 (two) times daily. 180 tablet 3   metFORMIN (GLUCOPHAGE-XR) 500 MG 24 hr tablet Take 1 tablet (500 mg total) by mouth 2 (two) times daily with a meal. 180 tablet 0   mirabegron ER (MYRBETRIQ) 50 MG TB24 tablet Take 50 mg by mouth daily.      Multiple Vitamins-Minerals (MULTIVITAMIN) tablet Take 1 tablet by mouth daily.     omeprazole (PRILOSEC) 20 MG capsule Take 1 capsule (20 mg total) by mouth daily. 90 capsule 0   ondansetron (ZOFRAN-ODT) 4 MG disintegrating tablet Take 1 tablet (4 mg total) by mouth every 8 (eight) hours as needed for nausea or vomiting. 30 tablet 0   Semaglutide-Weight Management 1.7 MG/0.75ML SOAJ Inject 1.7 mg into the skin once a week. 3 mL 1   thyroid (ARMOUR THYROID) 120 MG tablet TAKE 1 TABLET BY MOUTH DAILY BEFORE BREAKFAST 30 tablet 11   traZODone (DESYREL) 50 MG tablet Take 50 mg by mouth at bedtime.     triamcinolone cream (KENALOG) 0.1 % Apply 1 Application topically daily as needed. 30 g 0   No current facility-administered medications for this visit.    Allergies as of 07/11/2023 - Review Complete 07/11/2023  Allergen Reaction Noted   Erythromycin base Itching 07/12/2021   Codeine Itching 12/31/2012    Family History  Problem Relation Age of Onset   Heart disease Mother    Thyroid disease Mother    Depression Mother    Anxiety disorder Mother    Obesity Mother    Stroke Father    Hypertension Father    Heart disease Father    High Cholesterol Father    Parkinson's disease Maternal Grandmother    Cancer Paternal Grandmother        Pancreatic   Diabetes Paternal Grandfather    Diabetes Paternal Uncle    Breast cancer Neg Hx     Social History   Socioeconomic History   Marital status: Married    Spouse name: Fayrene Fearing   Number of children: 2   Years of education: Not on file   Highest education level: Some college, no degree  Occupational History   Occupation: Photographer    Comment: [employer]  Tobacco Use   Smoking status: Never   Smokeless tobacco: Never  Vaping Use   Vaping status: Never Used  Substance and Sexual Activity   Alcohol use: Yes    Alcohol/week: 3.0 standard drinks of alcohol    Types: 3 Glasses of wine per week   Drug use: No   Sexual activity:  Yes    Birth control/protection: Post-menopausal  Other Topics Concern   Not on file  Social History Narrative   Not on file   Social Drivers of Health   Financial Resource Strain: Low Risk  (11/18/2022)   Overall Financial Resource Strain (CARDIA)    Difficulty of Paying Living Expenses: Not hard at all  Food Insecurity: No Food Insecurity (11/18/2022)   Hunger Vital Sign    Worried About Running Out of Food in the Last Year: Never true    Ran Out of Food in the Last Year: Never  true  Transportation Needs: No Transportation Needs (11/18/2022)   PRAPARE - Administrator, Civil Service (Medical): No    Lack of Transportation (Non-Medical): No  Physical Activity: Insufficiently Active (11/18/2022)   Exercise Vital Sign    Days of Exercise per Week: 3 days    Minutes of Exercise per Session: 20 min  Stress: Stress Concern Present (11/18/2022)   [employer] of Occupational Health - Occupational Stress Questionnaire    Feeling of Stress : To some extent  Social Connections: Moderately Isolated (11/18/2022)   Social Connection and Isolation Panel [NHANES]    Frequency of Communication with Friends and Family: Once a week    Frequency of Social Gatherings with Friends and Family: Once a week    Attends Religious Services: 1 to 4 times per year    Active Member of [employer] or Organizations: No    Attends Engineer, structural: Not on file    Marital Status: Married  Intimate Partner Violence: Unknown (02/18/2022)   Received from [employer], [facility]   HITS    Physically Hurt: Not on file    Insult or Talk Down To: Not on file    Threaten Physical Harm: Not on file    Scream or Curse: Not on file    Review of Systems:    Constitutional: No weight loss, fever, chills, weakness or fatigue HEENT: Eyes: No change in vision               Ears, Nose, Throat:  No change in hearing or congestion Skin: No rash or itching Cardiovascular: No chest pain, chest pressure or  palpitations   Respiratory: No SOB or cough Gastrointestinal: See HPI and otherwise negative Genitourinary: No dysuria or change in urinary frequency Neurological: No headache, dizziness or syncope Musculoskeletal: No new muscle or joint pain Hematologic: No bleeding or bruising Psychiatric: No history of depression or anxiety    Physical Exam:  Vital signs: There were no vitals taken for this visit.  Constitutional:   Pleasant Caucasian female appears to be in NAD, Well developed, Well nourished, alert and cooperative Head:  Normocephalic and atraumatic. Eyes:   PEERL, EOMI. No icterus. Conjunctiva pink. Ears:  Normal auditory acuity. Neck:  Supple Throat: Oral cavity and pharynx without inflammation, swelling or lesion.  Respiratory: Respirations even and unlabored. Lungs clear to auscultation bilaterally.   No wheezes, crackles, or rhonchi.  Cardiovascular: Normal S1, S2. No MRG. Regular rate and rhythm. No peripheral edema, cyanosis or pallor.  Gastrointestinal:  Soft, nondistended, nontender. No rebound or guarding. Normal bowel sounds. No appreciable masses or hepatomegaly. Rectal:  Not performed.  Msk:  Symmetrical without gross deformities. Without edema, no deformity or joint abnormality.  Neurologic:  Alert and  oriented x4;  grossly normal neurologically.  Skin:   Dry and intact without significant lesions or rashes. Psychiatric: Oriented to person, place and time. Demonstrates good judgement and reason without abnormal affect or behaviors.  RELEVANT LABS AND IMAGING: CBC    Component Value Date/Time   WBC 7.1 06/29/2022 1050   WBC 6.7 06/22/2018 0434   RBC 4.46 06/29/2022 1050   RBC 3.64 (L) 06/22/2018 0434   HGB 14.4 06/29/2022 1050   HCT 42.4 06/29/2022 1050   PLT 254 06/29/2022 1050   MCV 95 06/29/2022 1050   MCH 32.3 06/29/2022 1050   MCH 31.3 06/22/2018 0434   MCHC 34.0 06/29/2022 1050   MCHC 31.1 06/22/2018 0434   RDW 12.0 06/29/2022 1050  LYMPHSABS  2.0 06/29/2022 1050   MONOABS 0.8 06/16/2017 1010   EOSABS 0.2 06/29/2022 1050   BASOSABS 0.1 06/29/2022 1050    CMP     Component Value Date/Time   NA 138 06/29/2022 1050   K 3.8 06/29/2022 1050   CL 98 06/29/2022 1050   CO2 20 06/29/2022 1050   GLUCOSE 92 06/29/2022 1050   GLUCOSE 90 11/24/2021 1330   BUN 23 06/29/2022 1050   CREATININE 0.71 06/29/2022 1050   CREATININE 0.66 04/06/2016 0931   CALCIUM 10.3 06/29/2022 1050   PROT 7.2 06/29/2022 1050   ALBUMIN 4.7 06/29/2022 1050   AST 17 06/29/2022 1050   ALT 19 06/29/2022 1050   ALKPHOS 56 06/29/2022 1050   BILITOT 0.3 06/29/2022 1050   GFRNONAA >60 06/20/2018 0419   GFRAA >60 06/20/2018 0419    Assessment: 1.  GERD: Controlled on Meprazole 20 mg daily 2.  Generalized abdominal pain: Better with Dicyclomine 10 mg 4 times daily; likely related to IBS 3.  Screening for colorectal cancer: Patient be due for repeat colonoscopy in March 2026 4.  History of partial colectomy after recurrent diverticulitis in 2020  Plan: 1.  Next colonoscopy due in March 2026.  This can be scheduled at her next visit. 2.  Refill Dicyclomine 10 mg 4 times daily #360 with 3 refills 3.  Refill Omeprazole 20 mg daily #90 with 3 refills 4.  Patient to follow in clinic in a year for further refills and to schedule her screening colonoscopy.  Hyacinth Meeker, PA-C [facility] 07/11/2023, 2:10 PM  Cc: Loyola Mast, MD

## 2023-07-11 NOTE — Patient Instructions (Addendum)
We have sent the following medications to your pharmacy for you to pick up at your convenience: Omeprazole 20 mg daily.  Dicyclomine 10 mg four times daily

## 2023-07-11 NOTE — Progress Notes (Signed)
Agree with assessment and plan as outlined.

## 2023-07-11 NOTE — Progress Notes (Signed)
Office: [phone]  /  Fax: [phone]  Weight Summary And Biometrics  Vitals Temp: 98.2 F (36.8 C) BP: 138/76 Pulse Rate: 90 SpO2: 99 %   Anthropometric Measurements Height: 5\' 3"  (1.6 m) Weight: 187 lb (84.8 kg) BMI (Calculated): 33.13 Weight at Last Visit: 192 lb Weight Lost Since Last Visit: 8 lb Weight Gained Since Last Visit: 0 lb Starting Weight: 224 lb Total Weight Loss (lbs): 40 lb (18.1 kg) Peak Weight: 230 lb   Body Composition  Body Fat %: 46.8 % Fat Mass (lbs): 86.4 lbs Muscle Mass (lbs): 93.2 lbs Total Body Water (lbs): 68.4 lbs Visceral Fat Rating : 14    No data recorded Today's Visit #: 16  Starting Date: 06/29/22   Subjective   Chief Complaint: Obesity  Shannon Reyes is here to discuss her progress with her obesity treatment plan. She is on the the Category 2 Plan and states she is following her eating plan approximately 100 % of the time. She states she is exercising 15 minutes 3-4 times per week.  Weight Progress Since Last Visit:  Discussed the use of AI scribe software for clinical note transcription with the patient, who gave verbal consent to proceed.  History of Present Illness   The patient presents for medical weight management.  She has lost eight pounds since her last office visit, averaging a weight loss of about one and a half pounds per week. This is attributed to increased exercise and dietary changes.  Her current dietary regimen includes consuming [place] yogurt with 20 grams of protein and no sugar, and making her own protein smoothies due to intolerance to whey and soy protein powders. She uses a paleo protein formula that does not upset her stomach. She ensures she gets 25 to 30 grams of protein at breakfast and dinner, focusing on a high-protein diet. She also takes multivitamins twice a day.  Her exercise routine includes resistance training with bands and walking on a treadmill for 15 minutes every other day. She has  increased her exercise recently, which she believes is contributing to her weight loss.  She does not feel hungry often, which she attributes to being busy and focused on work. She sometimes skips meals, particularly lunch, and tends to eat more at dinner. She is concerned about maintaining muscle mass and has been focusing on protein intake to prevent muscle loss.  She experiences occasional constipation, which she manages with milk of magnesia approximately once every other month. Constipation contributes to nausea and bloating when it occurs. No issues with hair loss.       Orexigenic Control: Denies problems with appetite and hunger signals.  Denies problems with satiety and satiation.  Denies problems with eating patterns and portion control.  Denies abnormal cravings. Denies feeling deprived or restricted.   Pharmacotherapy for weight management: She is currently taking Wegovy with adequate clinical response  and without side effects..   Assessment and Plan   Treatment Plan For Obesity:  Recommended Dietary Goals  Caroleena is currently in the action stage of change. As such, her goal is to continue weight management plan. She has agreed to: continue current plan  Behavioral Health and Counseling  We discussed the following behavioral modification strategies today: continue to work on maintaining a reduced calorie state, getting the recommended amount of protein, incorporating whole foods, making healthy choices, staying well hydrated and practicing mindfulness when eating..  Additional education and resources provided today: None  Recommended Physical Activity Goals  Kenisha has  been advised to work up to 150 minutes of moderate intensity aerobic activity a week and strengthening exercises 2-3 times per week for cardiovascular health, weight loss maintenance and preservation of muscle mass.   She has agreed to :  Think about enjoyable ways to increase daily physical activity  and overcoming barriers to exercise and Increase physical activity in their day and reduce sedentary time (increase NEAT).  Pharmacotherapy  We discussed various medication options to help Anihya with her weight loss efforts and we both agreed to : do not recommend further increases in GLP-1 due to loss of muscle  Associated Conditions Impacted by Obesity Treatment  Insulin resistance  Class 2 severe obesity due to excess calories with serious comorbidity and body mass index (BMI) of 37.0 to 37.9 in adult [facility]) -     Semaglutide-Weight Management; Inject 1.7 mg into the skin once a week.  Dispense: 3 mL; Refill: 1  Abnormal food appetite -     Semaglutide-Weight Management; Inject 1.7 mg into the skin once a week.  Dispense: 3 mL; Refill: 1    Patient continues to make steady progress on medically supervised weight management plan inclusive of GLP-1 therapy.  Her BIA information continues to show reductions in muscle mass.  She has been increasing physical activity and attempting to get about 90 g of protein per day but is not getting enough calories.  We discussed that she needs to be intentional about her nutritional intake and I will like for her to maintain at least 1200 cal/day to avoid further loss of muscle mass.  Overall there is been significant improvement in orexigenic signaling on GLP-1.  She will also continue medication for the management of her insulin resistance.    Objective   Physical Exam:  Blood pressure 132/78, pulse 84, temperature 98.2 F (36.8 C), height 5\' 3"  (1.6 m), weight 184 lb (83.5 kg), SpO2 99%. Body mass index is 32.59 kg/m.  General: She is overweight, cooperative, alert, well developed, and in no acute distress. PSYCH: Has normal mood, affect and thought process.   HEENT: EOMI, sclerae are anicteric. Lungs: Normal breathing effort, no conversational dyspnea. Extremities: No edema.  Neurologic: No gross sensory or motor deficits. No tremors or  fasciculations noted.    Diagnostic Data Reviewed:  BMET    Component Value Date/Time   NA 138 06/29/2022 1050   K 3.8 06/29/2022 1050   CL 98 06/29/2022 1050   CO2 20 06/29/2022 1050   GLUCOSE 92 06/29/2022 1050   GLUCOSE 90 11/24/2021 1330   BUN 23 06/29/2022 1050   CREATININE 0.71 06/29/2022 1050   CREATININE 0.66 04/06/2016 0931   CALCIUM 10.3 06/29/2022 1050   GFRNONAA >60 06/20/2018 0419   GFRAA >60 06/20/2018 0419   Lab Results  Component Value Date   HGBA1C 5.3 06/29/2022   HGBA1C 4.9 06/15/2018   Lab Results  Component Value Date   INSULIN 32.5 (H) 06/29/2022   Lab Results  Component Value Date   TSH 1.25 11/21/2022   CBC    Component Value Date/Time   WBC 7.1 06/29/2022 1050   WBC 6.7 06/22/2018 0434   RBC 4.46 06/29/2022 1050   RBC 3.64 (L) 06/22/2018 0434   HGB 14.4 06/29/2022 1050   HCT 42.4 06/29/2022 1050   PLT 254 06/29/2022 1050   MCV 95 06/29/2022 1050   MCH 32.3 06/29/2022 1050   MCH 31.3 06/22/2018 0434   MCHC 34.0 06/29/2022 1050   MCHC 31.1 06/22/2018 0434  RDW 12.0 06/29/2022 1050   Iron Studies No results found for: "IRON", "TIBC", "FERRITIN", "IRONPCTSAT" Lipid Panel     Component Value Date/Time   CHOL 179 06/29/2022 1050   TRIG 129 06/29/2022 1050   HDL 57 06/29/2022 1050   LDLCALC 99 06/29/2022 1050   Hepatic Function Panel     Component Value Date/Time   PROT 7.2 06/29/2022 1050   ALBUMIN 4.7 06/29/2022 1050   AST 17 06/29/2022 1050   ALT 19 06/29/2022 1050   ALKPHOS 56 06/29/2022 1050   BILITOT 0.3 06/29/2022 1050      Component Value Date/Time   TSH 1.25 11/21/2022 1550   Nutritional Lab Results  Component Value Date   VD25OH 42.1 06/29/2022    Follow-Up   Return in about 4 weeks (around 08/08/2023).Marland Kitchen She was informed of the importance of frequent follow up visits to maximize her success with intensive lifestyle modifications for her multiple health conditions.  Attestation Statement   Reviewed by  clinician on day of visit: allergies, medications, problem list, medical history, surgical history, family history, social history, and previous encounter notes.     Worthy Rancher, MD

## 2023-08-01 ENCOUNTER — Other Ambulatory Visit: Payer: Self-pay | Admitting: Family Medicine

## 2023-08-01 DIAGNOSIS — E039 Hypothyroidism, unspecified: Secondary | ICD-10-CM

## 2023-08-02 ENCOUNTER — Other Ambulatory Visit: Payer: Self-pay | Admitting: Physician Assistant

## 2023-08-08 ENCOUNTER — Ambulatory Visit (INDEPENDENT_AMBULATORY_CARE_PROVIDER_SITE_OTHER): Payer: Medicare Other | Admitting: Internal Medicine

## 2023-08-08 ENCOUNTER — Other Ambulatory Visit (HOSPITAL_COMMUNITY): Payer: Self-pay

## 2023-08-08 ENCOUNTER — Other Ambulatory Visit: Payer: Self-pay

## 2023-08-08 ENCOUNTER — Encounter (INDEPENDENT_AMBULATORY_CARE_PROVIDER_SITE_OTHER): Payer: Self-pay | Admitting: Internal Medicine

## 2023-08-08 ENCOUNTER — Other Ambulatory Visit: Payer: Self-pay | Admitting: Family Medicine

## 2023-08-08 DIAGNOSIS — R638 Other symptoms and signs concerning food and fluid intake: Secondary | ICD-10-CM | POA: Diagnosis not present

## 2023-08-08 DIAGNOSIS — Z6837 Body mass index (BMI) 37.0-37.9, adult: Secondary | ICD-10-CM

## 2023-08-08 DIAGNOSIS — E66812 Obesity, class 2: Secondary | ICD-10-CM

## 2023-08-08 DIAGNOSIS — E88819 Insulin resistance, unspecified: Secondary | ICD-10-CM

## 2023-08-08 DIAGNOSIS — R110 Nausea: Secondary | ICD-10-CM | POA: Diagnosis not present

## 2023-08-08 DIAGNOSIS — N951 Menopausal and female climacteric states: Secondary | ICD-10-CM

## 2023-08-08 MED ORDER — ONDANSETRON 4 MG PO TBDP
4.0000 mg | ORAL_TABLET | Freq: Three times a day (TID) | ORAL | 0 refills | Status: DC | PRN
  Filled 2023-08-08: qty 18, 6d supply, fill #0

## 2023-08-08 MED ORDER — METFORMIN HCL ER 500 MG PO TB24
500.0000 mg | ORAL_TABLET | Freq: Two times a day (BID) | ORAL | 0 refills | Status: DC
  Filled 2023-08-08: qty 180, 90d supply, fill #0

## 2023-08-08 MED ORDER — SEMAGLUTIDE-WEIGHT MANAGEMENT 1.7 MG/0.75ML ~~LOC~~ SOAJ
1.7000 mg | SUBCUTANEOUS | 1 refills | Status: DC
  Filled 2023-08-08: qty 3, 28d supply, fill #0

## 2023-08-08 NOTE — Progress Notes (Signed)
 Office: [phone]  /  Fax: [phone]  Weight Summary And Biometrics  Vitals Temp: 97.8 F (36.6 C) BP: 123/85 Pulse Rate: 86 SpO2: 100 %   Anthropometric Measurements Height: 5\' 3"  (1.6 m) Weight: 182 lb (82.6 kg) BMI (Calculated): 32.25 Weight at Last Visit: 184 lb Weight Lost Since Last Visit: 2 lb Weight Gained Since Last Visit: 0 lb Starting Weight: 224 lb Total Weight Loss (lbs): 42 lb (19.1 kg) Peak Weight: 230 lb   Body Composition  Body Fat %: 46.9 % Fat Mass (lbs): 85.4 lbs Muscle Mass (lbs): 91.8 lbs Total Body Water (lbs): 68.8 lbs Visceral Fat Rating : 17    No data recorded Today's Visit #: 17  Starting Date: 06/29/22   Subjective   Chief Complaint: Obesity  Discussed the use of AI scribe software for clinical note transcription with the patient, who gave verbal consent to proceed.  History of Present Illness   Shannon Reyes is a 73 year old female who presents for medical weight management.  She has lost two pounds since her last visit and adheres to a reduced calorie nutrition plan 90% of the time. She tracks her caloric intake, consumes more whole foods, ensures adequate protein intake, and maintains proper hydration. Since March 2024, she has lost approximately 42 pounds from a peak weight of 232 pounds and is currently three pounds away from her target weight of 175 pounds. Despite perceived minimal changes on the scale, she notes a reduction in waist size and visceral fat.  She experiences nausea for two to three days after taking Wegovy, which resolves thereafter. Ondansetron is used as needed for nausea and is highly effective. No vomiting or dry heaves are reported. She denies symptoms of hypoglycemia such as shaking or sweating and has no issues with metformin, which she feels helps with cravings for sweets. Attempts to reduce the metformin dose negatively impacted her well-being.  She feels satisfied when eating, reports feeling  full quickly, and avoids overeating to prevent discomfort and nausea. She takes a multivitamin daily and has increased her calorie intake to about 1200 calories per day, incorporating high-protein foods like [employer] yogurt and a three-egg omelet.  She has not been physically active due to cold weather and a busy schedule but plans to increase activity as the weather warms. She is considering using an electric bike for exercise and is preparing for a cruise in August, aiming to tone up by then.         Assessment and Plan   Treatment Plan For Obesity:  Recommended Dietary Goals  Shannon Reyes is currently in the action stage of change. As such, her goal is to continue weight management plan. She has agreed to: continue current plan  Behavioral Health and Counseling  We discussed the following behavioral modification strategies today: continue to work on maintaining a reduced calorie state, getting the recommended amount of protein, incorporating whole foods, making healthy choices, staying well hydrated and practicing mindfulness when eating..  Additional education and resources provided today: None  Recommended Physical Activity Goals  Shannon Reyes has been advised to work up to 150 minutes of moderate intensity aerobic activity a week and strengthening exercises 2-3 times per week for cardiovascular health, weight loss maintenance and preservation of muscle mass.   She has agreed to :  Think about enjoyable ways to increase daily physical activity and overcoming barriers to exercise and Increase physical activity in their day and reduce sedentary time (increase NEAT).  Pharmacotherapy  We discussed various medication options to help Shannon Reyes with her weight loss efforts and we both agreed to : adequate clinical response to current dose, continue current regimen and do not recommend further increases in GLP-1 due to loss of muscle  Associated Conditions Impacted by Obesity Treatment  Assessment &  Plan Insulin resistance  Class 2 severe obesity due to excess calories with serious comorbidity and body mass index (BMI) of 37.0 to 37.9 in adult [facility])  Abnormal food appetite  Nausea     Assessment and Plan    Obesity Management Following a reduced calorie nutrition plan 90% of the time, tracking calories, eating more whole foods, getting the recommended amount of protein, and drinking enough water. Lost two pounds since the last visit. Not exercising currently but plans to increase physical activity as the weather warms. Experiences nausea for two to three days after taking Wegovy, managed effectively with ondansetron. No symptoms of hypoglycemia reported. Satisfied with current regimen of Wegovy and metformin, noting improved insulin levels and reduced cravings for sweets. Lost a significant amount of weight (42-50 pounds) and noticing a reduction in visceral fat, particularly around the waist. Muscle mass has decreased slightly, likely due to reduced physical activity and menopause-related muscle loss. Discussed the importance of gradual weight loss and resistance exercises to preserve muscle mass. Prefers to continue metformin due to its benefits in managing insulin levels and cravings. - Continue current reduced calorie nutrition plan - Increase physical activity, including resistance exercises and walking - Consider beginner full body dumbbell workouts at home - Continue Wegovy until March 15, then transition to [place] program for prescription management - Continue metformin - Refill ondansetron - Follow up in six weeks for weight and progress check  Experiences nausea for two to three days after taking Wegovy, effectively managed with ondansetron. No vomiting or dry heaves reported. - Continue ondansetron as needed for nausea - Refill ondansetron  General Health Maintenance Taking a multivitamin daily and reports getting 7-9 hours of sleep per night with low levels of stress. Not  skipping meals and consuming high-protein snacks between meals. - Continue taking a daily multivitamin - Maintain current sleep and stress management routines  Follow-up - Schedule follow-up appointment in six weeks.          Objective   Physical Exam:  Blood pressure 123/85, pulse 86, temperature 97.8 F (36.6 C), height 5\' 3"  (1.6 m), weight 182 lb (82.6 kg), SpO2 100%. Body mass index is 32.24 kg/m.  General: She is overweight, cooperative, alert, well developed, and in no acute distress. PSYCH: Has normal mood, affect and thought process.   HEENT: EOMI, sclerae are anicteric. Lungs: Normal breathing effort, no conversational dyspnea. Extremities: No edema.  Neurologic: No gross sensory or motor deficits. No tremors or fasciculations noted.    Diagnostic Data Reviewed:  BMET    Component Value Date/Time   NA 138 06/29/2022 1050   K 3.8 06/29/2022 1050   CL 98 06/29/2022 1050   CO2 20 06/29/2022 1050   GLUCOSE 92 06/29/2022 1050   GLUCOSE 90 11/24/2021 1330   BUN 23 06/29/2022 1050   CREATININE 0.71 06/29/2022 1050   CREATININE 0.66 04/06/2016 0931   CALCIUM 10.3 06/29/2022 1050   GFRNONAA >60 06/20/2018 0419   GFRAA >60 06/20/2018 0419   Lab Results  Component Value Date   HGBA1C 5.3 06/29/2022   HGBA1C 4.9 06/15/2018   Lab Results  Component Value Date   INSULIN 32.5 (H) 06/29/2022   Lab Results  Component Value Date   TSH 1.25 11/21/2022   CBC    Component Value Date/Time   WBC 7.1 06/29/2022 1050   WBC 6.7 06/22/2018 0434   RBC 4.46 06/29/2022 1050   RBC 3.64 (L) 06/22/2018 0434   HGB 14.4 06/29/2022 1050   HCT 42.4 06/29/2022 1050   PLT 254 06/29/2022 1050   MCV 95 06/29/2022 1050   MCH 32.3 06/29/2022 1050   MCH 31.3 06/22/2018 0434   MCHC 34.0 06/29/2022 1050   MCHC 31.1 06/22/2018 0434   RDW 12.0 06/29/2022 1050   Iron Studies No results found for: "IRON", "TIBC", "FERRITIN", "IRONPCTSAT" Lipid Panel     Component Value  Date/Time   CHOL 179 06/29/2022 1050   TRIG 129 06/29/2022 1050   HDL 57 06/29/2022 1050   LDLCALC 99 06/29/2022 1050   Hepatic Function Panel     Component Value Date/Time   PROT 7.2 06/29/2022 1050   ALBUMIN 4.7 06/29/2022 1050   AST 17 06/29/2022 1050   ALT 19 06/29/2022 1050   ALKPHOS 56 06/29/2022 1050   BILITOT 0.3 06/29/2022 1050      Component Value Date/Time   TSH 1.25 11/21/2022 1550   Nutritional Lab Results  Component Value Date   VD25OH 42.1 06/29/2022    Follow-Up   Return in about 6 weeks (around 09/19/2023) for For Weight Mangement with Dr. Rikki Spearing.Marland Kitchen She was informed of the importance of frequent follow up visits to maximize her success with intensive lifestyle modifications for her multiple health conditions.  Attestation Statement   Reviewed by clinician on day of visit: allergies, medications, problem list, medical history, surgical history, family history, social history, and previous encounter notes.     Worthy Rancher, MD

## 2023-08-21 ENCOUNTER — Ambulatory Visit: Payer: No Typology Code available for payment source | Admitting: Family Medicine

## 2023-08-21 VITALS — BP 126/78 | HR 82 | Temp 97.4°F | Ht 63.0 in | Wt 184.8 lb

## 2023-08-21 DIAGNOSIS — I10 Essential (primary) hypertension: Secondary | ICD-10-CM

## 2023-08-21 DIAGNOSIS — E66811 Obesity, class 1: Secondary | ICD-10-CM | POA: Diagnosis not present

## 2023-08-21 DIAGNOSIS — E039 Hypothyroidism, unspecified: Secondary | ICD-10-CM

## 2023-08-21 LAB — COMPREHENSIVE METABOLIC PANEL
ALT: 20 U/L (ref 0–35)
AST: 15 U/L (ref 0–37)
Albumin: 4.2 g/dL (ref 3.5–5.2)
Alkaline Phosphatase: 40 U/L (ref 39–117)
BUN: 16 mg/dL (ref 6–23)
CO2: 27 meq/L (ref 19–32)
Calcium: 10.2 mg/dL (ref 8.4–10.5)
Chloride: 94 meq/L — ABNORMAL LOW (ref 96–112)
Creatinine, Ser: 0.62 mg/dL (ref 0.40–1.20)
GFR: 89.7 mL/min (ref >60.00)
Glucose, Bld: 91 mg/dL (ref 70–99)
Potassium: 3.4 meq/L — ABNORMAL LOW (ref 3.5–5.1)
Sodium: 130 meq/L — ABNORMAL LOW (ref 135–145)
Total Bilirubin: 0.3 mg/dL (ref 0.2–1.2)
Total Protein: 6.9 g/dL (ref 6.0–8.3)

## 2023-08-21 LAB — LIPASE: Lipase: 26 U/L (ref 11.0–59.0)

## 2023-08-21 LAB — TSH: TSH: 0.92 u[IU]/mL (ref 0.35–5.50)

## 2023-08-21 NOTE — Progress Notes (Signed)
 [facility] [address] [place] [address] Dept: [phone] Dept Fax: [phone]  Chronic Care Office Visit  Subjective:    Patient ID: Shannon Reyes, female    DOB: [birth year], 73 y.o..   MRN: [id number]  Chief Complaint  Patient presents with   Hypertension    6 month f/u.     History of Present Illness:  Patient is in today for reassessment of chronic medical issues.  Ms. Sisler has a history of essential hypertension. She was being managed on chlorthalidone 25 mg daily and lisinopril 40 mg twice daily. She wonders about whether she will be able to stop her blood pressure medicines at some point, in light of her weight loss.   Ms. Peron has a history of hypothyroidism. She is on Armour Thyroid (thyroid extract) 135 mg daily.     Ms. Preyer is working with Dr. Rikki Spearing on weight management. She is currently on metformin XR 500 mg daily and semaglutide [facility]) 1.7 mg weekly. She notes her medical insurance is requiring she work with Kathyrn Lass in order to continue her semaglutide. She was told she needs to have a CMP to be able to continue this.  Past Medical History: Patient Active Problem List   Diagnosis Date Noted   Nausea 03/15/2023   Seborrhea 02/20/2023   Stress incontinence 02/16/2023   Abnormal food appetite 02/02/2023   Stress 01/04/2023   Sleep difficulties 01/04/2023   Vasomotor symptoms due to menopause 08/15/2022   Class 1 obesity 07/27/2022   Insulin resistance 07/13/2022   Anxiety with depression 10/27/2021   ADD (attention deficit disorder) 10/27/2021   Hypothyroidism 07/12/2021   Diverticulitis of sigmoid colon s/p lap colectomy 06/19/2018 06/23/2018   S/P partial colectomy 06/19/2018   Degeneration of lumbar intervertebral disc 03/30/2018   Perennial allergic rhinitis 03/15/2016   Sensorineural hearing loss (SNHL), bilateral 03/15/2016   Tinnitus, bilateral 03/15/2016   GERD (gastroesophageal reflux  disease) 01/25/2013   Essential hypertension 12/31/2012   Systolic murmur 12/31/2012   Coronary artery fistula 12/31/2012   Past Surgical History:  Procedure Laterality Date   ABDOMINAL HYSTERECTOMY  1981   COMPLETE   ANKLE SURGERY Right 2004/2014   BLEPHAROPLASTY Left    CATARACT EXTRACTION Bilateral    LEFT OCT 2018, RIGHT NOV 2019   CESAREAN SECTION  1977 AND 1980   COLON RESECTION Left 06/19/2018   Procedure: LAPAROSCOPIC ASSISTED  LEFT COLON RESECTION;  Surgeon: Luretha Murphy, MD;  Location: [facility];  Service: General;  Laterality: Left;  ERAS PATHWAY   CYSTOSCOPY WITH STENT PLACEMENT Bilateral 06/19/2018   Procedure: CYSTOSCOPY WITH BILATERAL URETERAL CATHETER PLACEMENT;  Surgeon: Bjorn Pippin, MD;  Location: [facility];  Service: Urology;  Laterality: Bilateral;   OVARIAN CYST REMOVAL     Family History  Problem Relation Age of Onset   Heart disease Mother    Thyroid disease Mother    Depression Mother    Anxiety disorder Mother    Obesity Mother    Stroke Father    Hypertension Father    Heart disease Father    High Cholesterol Father    Parkinson's disease Maternal Grandmother    Cancer Paternal Grandmother        Pancreatic   Diabetes Paternal Grandfather    Diabetes Paternal Uncle    Breast cancer Neg Hx    Outpatient Medications Prior to Visit  Medication Sig Dispense Refill   ARMOUR THYROID 120 MG tablet TAKE 1 TABLET BY MOUTH DAILY  BEFORE BREAKFAST 30 tablet 11   ARMOUR THYROID 15 MG tablet TAKE 1 TABLET BY MOUTH DAILY BEFORE BREAKFAST 90 tablet 3   buPROPion (WELLBUTRIN XL) 300 MG 24 hr tablet Take 300 mg by mouth daily.     chlorthalidone (HYGROTON) 25 MG tablet TAKE 1 TABLET BY MOUTH DAILY 90 tablet 3   dicyclomine (BENTYL) 10 MG capsule Take 1 capsule (10 mg total) by mouth 4 (four) times daily -  before meals and at bedtime. 120 capsule 1   estradiol (ESTRACE) 1 MG tablet TAKE 1 TABLET BY MOUTH DAILY 90 tablet 3   EVEKEO 10 MG TABS Take 10 mg by mouth  daily as needed (ADHD).   0   lisinopril (ZESTRIL) 40 MG tablet Take 1 tablet (40 mg total) by mouth 2 (two) times daily. 180 tablet 3   metFORMIN (GLUCOPHAGE-XR) 500 MG 24 hr tablet Take 1 tablet (500 mg total) by mouth 2 (two) times daily with a meal. 180 tablet 0   mirabegron ER (MYRBETRIQ) 50 MG TB24 tablet Take 50 mg by mouth daily.     Multiple Vitamins-Minerals (MULTIVITAMIN) tablet Take 1 tablet by mouth daily.     omeprazole (PRILOSEC) 20 MG capsule Take 1 capsule (20 mg total) by mouth daily. 90 capsule 3   ondansetron (ZOFRAN-ODT) 4 MG disintegrating tablet Take 1 tablet (4 mg total) by mouth every 8 (eight) hours as needed for nausea or vomiting. 30 tablet 0   Semaglutide-Weight Management 1.7 MG/0.75ML SOAJ Inject 1.7 mg into the skin once a week. 3 mL 1   traZODone (DESYREL) 50 MG tablet Take 50 mg by mouth at bedtime.     triamcinolone cream (KENALOG) 0.1 % Apply 1 Application topically daily as needed. 30 g 0   No facility-administered medications prior to visit.   Allergies  Allergen Reactions   Erythromycin Base Itching   Codeine Itching   Objective:   Today's Vitals   08/21/23 1253  BP: 126/78  Pulse: 82  Temp: (!) 97.4 F (36.3 C)  TempSrc: Temporal  SpO2: 99%  Weight: 184 lb 12.8 oz (83.8 kg)  Height: 5\' 3"  (1.6 m)   Body mass index is 32.74 kg/m.   General: Well developed, well nourished. No acute distress. Psych: Alert and oriented. Normal mood and affect.  Health Maintenance Due  Topic Date Due   Hepatitis C Screening  Never done   Zoster Vaccines- Shingrix (1 of 2) Never done     Assessment & Plan:   Problem List Items Addressed This Visit       Cardiovascular and Mediastinum   Essential hypertension    Blood pressure is in good control. Continue lisinopril 40 mg bid and chlorthalidone 25 mg daily. We could consider decreasing her lisinopril once her BMI is under 30. I iwll check her annual renal labs today.       Relevant Orders    Comprehensive metabolic panel     Endocrine   Hypothyroidism - Primary   I will check a TSH today. Continue Armour Thyroid 135 mg daily.      Relevant Orders   TSH     Other   Class 1 obesity   Maximum weight: 232 lbs (08/2022) Current weight: 184 lbs Weight change since last visit: - 28 lbs Total weight loss: 48 lbs (20.7%)  Continue metformin 500 mg daily and semaglutide 1.7 mg weekly. Continue efforts at improved diet and regular exercise. Follow-up with Dr. Rikki Spearing. I will order a CMP as required  by her insurance.      Relevant Orders   Lipase    Return in about 6 months (around 02/21/2024) for Reassessment.   Loyola Mast, MD

## 2023-08-21 NOTE — Assessment & Plan Note (Addendum)
 Blood pressure is in good control. Continue lisinopril 40 mg bid and chlorthalidone 25 mg daily. We could consider decreasing her lisinopril once her BMI is under 30. I iwll check her annual renal labs today.

## 2023-08-21 NOTE — Assessment & Plan Note (Signed)
 I will check a TSH today. Continue Armour Thyroid 135 mg daily.

## 2023-08-21 NOTE — Assessment & Plan Note (Signed)
 Maximum weight: 232 lbs (08/2022) Current weight: 184 lbs Weight change since last visit: - 28 lbs Total weight loss: 48 lbs (20.7%)  Continue metformin 500 mg daily and semaglutide 1.7 mg weekly. Continue efforts at improved diet and regular exercise. Follow-up with Dr. Rikki Spearing. I will order a CMP as required by her insurance.

## 2023-08-22 ENCOUNTER — Other Ambulatory Visit (HOSPITAL_COMMUNITY): Payer: Self-pay

## 2023-08-22 ENCOUNTER — Encounter: Payer: Self-pay | Admitting: Family Medicine

## 2023-08-24 ENCOUNTER — Other Ambulatory Visit: Payer: Self-pay | Admitting: Family Medicine

## 2023-08-24 DIAGNOSIS — Z1231 Encounter for screening mammogram for malignant neoplasm of breast: Secondary | ICD-10-CM

## 2023-08-28 ENCOUNTER — Ambulatory Visit
Admission: RE | Admit: 2023-08-28 | Discharge: 2023-08-28 | Disposition: A | Discharge status: Home / Self Care | Source: Ambulatory Visit

## 2023-08-28 DIAGNOSIS — Z1231 Encounter for screening mammogram for malignant neoplasm of breast: Secondary | ICD-10-CM

## 2023-08-31 ENCOUNTER — Other Ambulatory Visit: Payer: Self-pay | Admitting: Family Medicine

## 2023-08-31 DIAGNOSIS — R928 Other abnormal and inconclusive findings on diagnostic imaging of breast: Secondary | ICD-10-CM

## 2023-09-01 ENCOUNTER — Other Ambulatory Visit (HOSPITAL_COMMUNITY): Payer: Self-pay

## 2023-09-01 MED ORDER — WEGOVY 2.4 MG/0.75ML ~~LOC~~ SOAJ
2.4000 mg | SUBCUTANEOUS | 0 refills | Status: DC
  Filled 2023-09-01: qty 3, 28d supply, fill #0

## 2023-09-11 ENCOUNTER — Other Ambulatory Visit (INDEPENDENT_AMBULATORY_CARE_PROVIDER_SITE_OTHER): Payer: Self-pay | Admitting: Internal Medicine

## 2023-09-11 ENCOUNTER — Ambulatory Visit
Admission: RE | Admit: 2023-09-11 | Discharge: 2023-09-11 | Disposition: A | Discharge status: Home / Self Care | Source: Ambulatory Visit | Attending: Family Medicine

## 2023-09-11 DIAGNOSIS — R928 Other abnormal and inconclusive findings on diagnostic imaging of breast: Secondary | ICD-10-CM

## 2023-09-11 DIAGNOSIS — E88819 Insulin resistance, unspecified: Secondary | ICD-10-CM

## 2023-09-12 ENCOUNTER — Other Ambulatory Visit (INDEPENDENT_AMBULATORY_CARE_PROVIDER_SITE_OTHER): Payer: Self-pay | Admitting: Internal Medicine

## 2023-09-12 DIAGNOSIS — E88819 Insulin resistance, unspecified: Secondary | ICD-10-CM

## 2023-09-13 ENCOUNTER — Other Ambulatory Visit: Payer: Self-pay | Admitting: Family Medicine

## 2023-09-13 DIAGNOSIS — R921 Mammographic calcification found on diagnostic imaging of breast: Secondary | ICD-10-CM

## 2023-09-14 ENCOUNTER — Ambulatory Visit
Admission: RE | Admit: 2023-09-14 | Discharge: 2023-09-14 | Disposition: A | Discharge status: Home / Self Care | Source: Ambulatory Visit | Attending: Family Medicine

## 2023-09-14 ENCOUNTER — Ambulatory Visit
Admission: RE | Admit: 2023-09-14 | Discharge: 2023-09-14 | Discharge status: Home / Self Care | Source: Ambulatory Visit | Attending: Family Medicine

## 2023-09-14 DIAGNOSIS — R921 Mammographic calcification found on diagnostic imaging of breast: Secondary | ICD-10-CM

## 2023-09-14 HISTORY — PX: BREAST BIOPSY: SHX20

## 2023-09-15 LAB — SURGICAL PATHOLOGY

## 2023-09-19 ENCOUNTER — Other Ambulatory Visit (HOSPITAL_COMMUNITY): Payer: Self-pay

## 2023-09-19 ENCOUNTER — Encounter (INDEPENDENT_AMBULATORY_CARE_PROVIDER_SITE_OTHER): Payer: Self-pay | Admitting: Internal Medicine

## 2023-09-19 ENCOUNTER — Ambulatory Visit (INDEPENDENT_AMBULATORY_CARE_PROVIDER_SITE_OTHER): Payer: Medicare Other | Admitting: Internal Medicine

## 2023-09-19 VITALS — BP 143/89 | HR 82 | Temp 97.9°F | Ht 63.0 in | Wt 176.0 lb

## 2023-09-19 DIAGNOSIS — I10 Essential (primary) hypertension: Secondary | ICD-10-CM

## 2023-09-19 DIAGNOSIS — D369 Benign neoplasm, unspecified site: Secondary | ICD-10-CM | POA: Insufficient documentation

## 2023-09-19 DIAGNOSIS — Z6831 Body mass index (BMI) 31.0-31.9, adult: Secondary | ICD-10-CM

## 2023-09-19 DIAGNOSIS — D242 Benign neoplasm of left breast: Secondary | ICD-10-CM

## 2023-09-19 DIAGNOSIS — E876 Hypokalemia: Secondary | ICD-10-CM | POA: Insufficient documentation

## 2023-09-19 DIAGNOSIS — E66811 Obesity, class 1: Secondary | ICD-10-CM

## 2023-09-19 DIAGNOSIS — E88819 Insulin resistance, unspecified: Secondary | ICD-10-CM

## 2023-09-19 MED ORDER — WEGOVY 2.4 MG/0.75ML ~~LOC~~ SOAJ
0.7500 mL | SUBCUTANEOUS | 0 refills | Status: DC
Start: 2023-09-19 — End: 2023-10-12
  Filled 2023-09-19 – 2023-09-29 (×2): qty 3, 28d supply, fill #0

## 2023-09-19 NOTE — Progress Notes (Signed)
 Office: [phone]  /  Fax: [phone]  Weight Summary And Biometrics  Vitals Temp: 97.9 F (36.6 C) BP: (!) 143/89 Pulse Rate: 82 SpO2: 100 %   Anthropometric Measurements Height: 5\' 3"  (1.6 m) Weight: 176 lb (79.8 kg) BMI (Calculated): 31.18 Weight at Last Visit: 182 lb Weight Lost Since Last Visit: 6 lb Weight Gained Since Last Visit: 0 lb Starting Weight: 224 lb Total Weight Loss (lbs): 48 lb (21.8 kg) Peak Weight: 230 lb   Body Composition  Body Fat %: 45.7 % Fat Mass (lbs): 80.8 lbs Muscle Mass (lbs): 91.2 lbs Total Body Water (lbs): 67 lbs Visceral Fat Rating : 13    No data recorded Today's Visit #: 18  Starting Date: 06/29/22   Subjective   Chief Complaint: Obesity  Interval History Discussed the use of AI scribe software for clinical note transcription with the patient, who gave verbal consent to proceed.  History of Present Illness Shannon Reyes is a 73 year old female who presents for follow-up on medical weight management.  She is currently on Wegovy for insulin resistance and medical weight management, with her dose recently increased to 2.4 mg three weeks ago. She participates in a virtual program that supports diabetes and weight management, providing tools like a glucose machine, weight scale, and food scale, all connected to an app. She appreciates the support and monitoring, including reminders for injection refills and regular nurse check-ins.  She experiences nausea and occasional vomiting, particularly on an empty stomach in the morning, typically after her weekly Wegovy injection on Saturdays, subsiding by Tuesday. She manages the nausea with an over-the-counter chewable medication and peppermint oil, which she finds effective. Her dietary intake has been inconsistent due to nausea and recent stress related to an abnormal mammogram. She has been skipping meals but tries to maintain protein intake with foods like protein balls and [place]  burgers. She also uses an electrolyte additive to help with hydration, though she notes a craving for salty foods.  She had a recent abnormal mammogram, leading to a biopsy that revealed an intraductal papilloma without atypia or malignancy. This has caused significant stress and impacted her sleep, though she notes improvement in sleep quality recently.  Her physical activity includes using a walking pad and walking outside, which has helped reduce her body fat percentage and visceral fat. She has lost six pounds since February, though she notes fluctuations in weight loss. She is considering rejoining a local gym to focus on strength training, as she has noticed a decrease in muscle strength and soreness after gardening.  She is currently taking metformin, a multivitamin twice daily, and has reduced her Adderall intake to 20 mg in the morning, sometimes skipping the afternoon dose. She has also stopped taking trazodone due to restless legs. She is aware of her blood pressure being higher than usual and is monitoring it at home.    Challenges affecting patient progress: low volume of physical activity at present .    Pharmacotherapy for weight management: She is currently taking Wegovy with adequate clinical response  and experiencing the following side effects: nausea.. Medication increased by workplace weight management program   Assessment and Plan   Treatment Plan For Obesity:  Recommended Dietary Goals  Shannon Reyes is currently in the action stage of change. As such, her goal is to continue weight management plan. She has agreed to: follow the Category 2 plan - 1200 kcal per day  Behavioral Health and Counseling  We discussed  the following behavioral modification strategies today: increasing lean protein intake to established goals, avoiding skipping meals, increasing water intake , and continue to work on maintaining a reduced calorie state, getting the recommended amount of protein,  incorporating whole foods, making healthy choices, staying well hydrated and practicing mindfulness when eating..  Additional education and resources provided today: Handout guide to GLP-1 therapy and management of SE  Recommended Physical Activity Goals  Shannon Reyes has been advised to work up to 150 minutes of moderate intensity aerobic activity a week and strengthening exercises 2-3 times per week for cardiovascular health, weight loss maintenance and preservation of muscle mass.   She has agreed to :  Start strengthening exercises with a goal of 2-3 sessions a week   Pharmacotherapy  We discussed various medication options to help Shannon Reyes with her weight loss efforts and we both agreed to : adequate clinical response to current dose, continue current regimen  Associated Conditions Impacted by Obesity Treatment  Hypokalemia Assessment & Plan: I reviewed most recent labs she has a potassium of 3.4 with hypochloremia and hyponatremia likely secondary to chlorthalidone.  She is also had nausea and vomiting associated with the increase of Wegovy.  Her medication was increased by workplace weight management provider.  I will like to check her electrolytes today I will let her PCP know if there is still some electrolyte derangements for supplementation or change of thiazide diuretic for other blood pressure medication.  Orders: -     Basic metabolic panel with GFR -     Magnesium  Insulin resistance Assessment & Plan: Improved  Lab Results  Component Value Date   HGBA1C 5.3 06/29/2022   Lab Results  Component Value Date   INSULIN 32.5 (H) 06/29/2022   Lab Results  Component Value Date   GLUCOSE 91 08/21/2023   GLUCOSE 101 (H) 03/22/2016   She will continue on metformin and Wegovy for pharmacoprophylaxis.       Essential hypertension Assessment & Plan: She is on ARB and chlorthalidone. There is an upward trend in her blood pressure, potentially due to stress, sodium intake  from IV hydration supplements, and stimulating medications. Her blood pressure today was 143/89 mmHg, higher than usual. She is advised to monitor her blood pressure at home and reduce sodium intake from supplements. - Continue ARB and chlorthalidone - Monitor blood pressure at home - Reduce sodium intake from supplements   Class 1 obesity Assessment & Plan: She has lost 6 pounds in the last 2 months which brings her to a total of 48 pounds 22% of total body weight since starting her program back in January of last year.  Her Shannon Reyes was recently increased by employer based weight management strategy that started about a month ago.  Unfortunately she is experiencing increasing nausea which seems to be improving.  I had held off increasing medication previously.  She will continue to use ondansetron I also provided her with a handout on the management of nausea on GLP-1 - Continue Wegovy at 2.4 mg - Maintain protein intake and hydration - Incorporate strengthening exercises - Monitor weight and body composition - Use over-the-counter nausea medication and peppermint oil as needed   Intraductal papilloma Assessment & Plan: She underwent a biopsy for an abnormal mammogram, revealing an intraductal papilloma in the left breast, negative for atypia or malignancy. This finding has caused anxiety, but the result is considered positive as there is no malignant transformation. She is advised to continue with regular mammograms to monitor  for any changes. - Continue annual mammograms - Monitor for any changes in breast tissue           Objective   Physical Exam:  Blood pressure (!) 143/89, pulse 82, temperature 97.9 F (36.6 C), height 5\' 3"  (1.6 m), weight 176 lb (79.8 kg), SpO2 100%. Body mass index is 31.18 kg/m.  General: She is overweight, cooperative, alert, well developed, and in no acute distress. PSYCH: Has normal mood, affect and thought process.   HEENT: EOMI, sclerae are  anicteric. Lungs: Normal breathing effort, no conversational dyspnea. Extremities: No edema.  Neurologic: No gross sensory or motor deficits. No tremors or fasciculations noted.    Diagnostic Data Reviewed:  BMET    Component Value Date/Time   NA 130 (L) 08/21/2023 1329   NA 138 06/29/2022 1050   K 3.4 (L) 08/21/2023 1329   CL 94 (L) 08/21/2023 1329   CO2 27 08/21/2023 1329   GLUCOSE 91 08/21/2023 1329   BUN 16 08/21/2023 1329   BUN 23 06/29/2022 1050   CREATININE 0.62 08/21/2023 1329   CREATININE 0.66 04/06/2016 0931   CALCIUM 10.2 08/21/2023 1329   GFRNONAA >60 06/20/2018 0419   GFRAA >60 06/20/2018 0419   Lab Results  Component Value Date   HGBA1C 5.3 06/29/2022   HGBA1C 4.9 06/15/2018   Lab Results  Component Value Date   INSULIN 32.5 (H) 06/29/2022   Lab Results  Component Value Date   TSH 0.92 08/21/2023   CBC    Component Value Date/Time   WBC 7.1 06/29/2022 1050   WBC 6.7 06/22/2018 0434   RBC 4.46 06/29/2022 1050   RBC 3.64 (L) 06/22/2018 0434   HGB 14.4 06/29/2022 1050   HCT 42.4 06/29/2022 1050   PLT 254 06/29/2022 1050   MCV 95 06/29/2022 1050   MCH 32.3 06/29/2022 1050   MCH 31.3 06/22/2018 0434   MCHC 34.0 06/29/2022 1050   MCHC 31.1 06/22/2018 0434   RDW 12.0 06/29/2022 1050   Iron Studies No results found for: "IRON", "TIBC", "FERRITIN", "IRONPCTSAT" Lipid Panel     Component Value Date/Time   CHOL 179 06/29/2022 1050   TRIG 129 06/29/2022 1050   HDL 57 06/29/2022 1050   LDLCALC 99 06/29/2022 1050   Hepatic Function Panel     Component Value Date/Time   PROT 6.9 08/21/2023 1329   PROT 7.2 06/29/2022 1050   ALBUMIN 4.2 08/21/2023 1329   ALBUMIN 4.7 06/29/2022 1050   AST 15 08/21/2023 1329   ALT 20 08/21/2023 1329   ALKPHOS 40 08/21/2023 1329   BILITOT 0.3 08/21/2023 1329   BILITOT 0.3 06/29/2022 1050      Component Value Date/Time   TSH 0.92 08/21/2023 1329   Nutritional Lab Results  Component Value Date   VD25OH 42.1  06/29/2022    Medications: Outpatient Encounter Medications as of 09/19/2023  Medication Sig   ARMOUR THYROID 120 MG tablet TAKE 1 TABLET BY MOUTH DAILY BEFORE BREAKFAST   ARMOUR THYROID 15 MG tablet TAKE 1 TABLET BY MOUTH DAILY BEFORE BREAKFAST   buPROPion (WELLBUTRIN XL) 300 MG 24 hr tablet Take 300 mg by mouth daily.   chlorthalidone (HYGROTON) 25 MG tablet TAKE 1 TABLET BY MOUTH DAILY   dicyclomine (BENTYL) 10 MG capsule Take 1 capsule (10 mg total) by mouth 4 (four) times daily -  before meals and at bedtime.   estradiol (ESTRACE) 1 MG tablet TAKE 1 TABLET BY MOUTH DAILY   EVEKEO 10 MG TABS Take 10  mg by mouth daily as needed (ADHD).    lisinopril (ZESTRIL) 40 MG tablet Take 1 tablet (40 mg total) by mouth 2 (two) times daily.   metFORMIN (GLUCOPHAGE-XR) 500 MG 24 hr tablet TAKE 1 TABLET BY MOUTH TWICE A DAY WITH A MEAL   mirabegron ER (MYRBETRIQ) 50 MG TB24 tablet Take 50 mg by mouth daily.   Multiple Vitamins-Minerals (MULTIVITAMIN) tablet Take 1 tablet by mouth daily.   omeprazole (PRILOSEC) 20 MG capsule Take 1 capsule (20 mg total) by mouth daily.   ondansetron (ZOFRAN-ODT) 4 MG disintegrating tablet Take 1 tablet (4 mg total) by mouth every 8 (eight) hours as needed for nausea or vomiting.   traZODone (DESYREL) 50 MG tablet Take 50 mg by mouth at bedtime.   triamcinolone cream (KENALOG) 0.1 % Apply 1 Application topically daily as needed.   [DISCONTINUED] Semaglutide-Weight Management (WEGOVY) 2.4 MG/0.75ML SOAJ Inject 2.4 mg into the skin once a week.   [DISCONTINUED] Semaglutide-Weight Management 1.7 MG/0.75ML SOAJ Inject 1.7 mg into the skin once a week.   No facility-administered encounter medications on file as of 09/19/2023.     Follow-Up   Return in about 2 months (around 11/19/2023) for For Weight Mangement with Dr. Rikki Spearing.Marland Kitchen She was informed of the importance of frequent follow up visits to maximize her success with intensive lifestyle modifications for her multiple  health conditions.  Attestation Statement   Reviewed by clinician on day of visit: allergies, medications, problem list, medical history, surgical history, family history, social history, and previous encounter notes.   I have spent 40 minutes in the care of the patient today including: 2 minutes before the visit reviewing the chart 34 minutes face-to-face assessing and reviewing listed medical problems as outlined in obesity care plan, providing nutritional and behavioral counseling as outlined in obesity care plan, independently interpreting results and goals of care, see listed medical problems, discussing biometric information and progress, and reviewing mammogram as well as discussing biopsy results, addressing electrolyte abnormalities on labs ordered by another provider. 7 minutes after the visit on documentation    Worthy Rancher, MD

## 2023-09-19 NOTE — Assessment & Plan Note (Signed)
 She is on ARB and chlorthalidone. There is an upward trend in her blood pressure, potentially due to stress, sodium intake from IV hydration supplements, and stimulating medications. Her blood pressure today was 143/89 mmHg, higher than usual. She is advised to monitor her blood pressure at home and reduce sodium intake from supplements. - Continue ARB and chlorthalidone - Monitor blood pressure at home - Reduce sodium intake from supplements

## 2023-09-19 NOTE — Assessment & Plan Note (Signed)
 I reviewed most recent labs she has a potassium of 3.4 with hypochloremia and hyponatremia likely secondary to chlorthalidone.  She is also had nausea and vomiting associated with the increase of Wegovy.  Her medication was increased by workplace weight management provider.  I will like to check her electrolytes today I will let her PCP know if there is still some electrolyte derangements for supplementation or change of thiazide diuretic for other blood pressure medication.

## 2023-09-19 NOTE — Assessment & Plan Note (Signed)
 Improved  Lab Results  Component Value Date   HGBA1C 5.3 06/29/2022   Lab Results  Component Value Date   INSULIN 32.5 (H) 06/29/2022   Lab Results  Component Value Date   GLUCOSE 91 08/21/2023   GLUCOSE 101 (H) 03/22/2016   She will continue on metformin and Wegovy for pharmacoprophylaxis.

## 2023-09-19 NOTE — Assessment & Plan Note (Signed)
 She underwent a biopsy for an abnormal mammogram, revealing an intraductal papilloma in the left breast, negative for atypia or malignancy. This finding has caused anxiety, but the result is considered positive as there is no malignant transformation. She is advised to continue with regular mammograms to monitor for any changes. - Continue annual mammograms - Monitor for any changes in breast tissue

## 2023-09-19 NOTE — Assessment & Plan Note (Signed)
 She has lost 6 pounds in the last 2 months which brings her to a total of 48 pounds 22% of total body weight since starting her program back in January of last year.  Her Reginal Lutes was recently increased by employer based weight management strategy that started about a month ago.  Unfortunately she is experiencing increasing nausea which seems to be improving.  I had held off increasing medication previously.  She will continue to use ondansetron I also provided her with a handout on the management of nausea on GLP-1 - Continue Wegovy at 2.4 mg - Maintain protein intake and hydration - Incorporate strengthening exercises - Monitor weight and body composition - Use over-the-counter nausea medication and peppermint oil as needed

## 2023-09-20 LAB — BASIC METABOLIC PANEL WITH GFR
BUN/Creatinine Ratio: 31 — ABNORMAL HIGH (ref 12–28)
BUN: 18 mg/dL (ref 8–27)
CO2: 23 mmol/L (ref 20–29)
Calcium: 9.9 mg/dL (ref 8.7–10.3)
Chloride: 95 mmol/L — ABNORMAL LOW (ref 96–106)
Creatinine, Ser: 0.58 mg/dL (ref 0.57–1.00)
Glucose: 84 mg/dL (ref 70–99)
Potassium: 3.8 mmol/L (ref 3.5–5.2)
Sodium: 134 mmol/L (ref 134–144)
eGFR: 97 mL/min/{1.73_m2} (ref >59)

## 2023-09-20 LAB — MAGNESIUM: Magnesium: 1.7 mg/dL (ref 1.6–2.3)

## 2023-09-21 ENCOUNTER — Encounter (INDEPENDENT_AMBULATORY_CARE_PROVIDER_SITE_OTHER): Payer: Self-pay | Admitting: Internal Medicine

## 2023-09-29 ENCOUNTER — Other Ambulatory Visit (HOSPITAL_COMMUNITY): Payer: Self-pay

## 2023-10-03 ENCOUNTER — Encounter (INDEPENDENT_AMBULATORY_CARE_PROVIDER_SITE_OTHER): Payer: Self-pay | Admitting: Internal Medicine

## 2023-10-12 ENCOUNTER — Other Ambulatory Visit (HOSPITAL_COMMUNITY): Payer: Self-pay

## 2023-10-12 ENCOUNTER — Telehealth (INDEPENDENT_AMBULATORY_CARE_PROVIDER_SITE_OTHER): Payer: Self-pay

## 2023-10-12 MED ORDER — WEGOVY 2.4 MG/0.75ML ~~LOC~~ SOAJ
2.4000 mg | SUBCUTANEOUS | 0 refills | Status: DC
  Filled 2023-10-12 – 2023-10-21 (×2): qty 3, 28d supply, fill #0

## 2023-10-12 NOTE — Telephone Encounter (Signed)
 Called pt and she reports rash was gone as of this past Sunday and is fine.

## 2023-10-19 ENCOUNTER — Other Ambulatory Visit (HOSPITAL_COMMUNITY): Payer: Self-pay

## 2023-10-19 MED ORDER — WEGOVY 2.4 MG/0.75ML ~~LOC~~ SOAJ
2.4000 mg | SUBCUTANEOUS | 0 refills | Status: DC
Start: 2023-10-19 — End: 2024-02-21
  Filled 2023-10-19 – 2023-11-21 (×2): qty 3, 28d supply, fill #0

## 2023-10-21 ENCOUNTER — Other Ambulatory Visit (HOSPITAL_COMMUNITY): Payer: Self-pay

## 2023-11-08 ENCOUNTER — Other Ambulatory Visit (HOSPITAL_COMMUNITY): Payer: Self-pay

## 2023-11-08 MED ORDER — WEGOVY 2.4 MG/0.75ML ~~LOC~~ SOAJ
2.4000 mg | SUBCUTANEOUS | 0 refills | Status: DC
  Filled 2023-11-08: qty 3, 28d supply, fill #0

## 2023-11-09 ENCOUNTER — Other Ambulatory Visit (HOSPITAL_COMMUNITY): Payer: Self-pay

## 2023-11-16 ENCOUNTER — Ambulatory Visit (INDEPENDENT_AMBULATORY_CARE_PROVIDER_SITE_OTHER): Admitting: Internal Medicine

## 2023-11-21 ENCOUNTER — Other Ambulatory Visit (HOSPITAL_COMMUNITY): Payer: Self-pay

## 2023-11-21 ENCOUNTER — Other Ambulatory Visit: Payer: Self-pay

## 2023-11-27 ENCOUNTER — Ambulatory Visit (INDEPENDENT_AMBULATORY_CARE_PROVIDER_SITE_OTHER): Admitting: Internal Medicine

## 2023-11-27 ENCOUNTER — Encounter (INDEPENDENT_AMBULATORY_CARE_PROVIDER_SITE_OTHER): Payer: Self-pay | Admitting: Internal Medicine

## 2023-11-27 VITALS — BP 145/89 | HR 80 | Temp 98.0°F | Ht 63.0 in | Wt 169.0 lb

## 2023-11-27 DIAGNOSIS — E88819 Insulin resistance, unspecified: Secondary | ICD-10-CM

## 2023-11-27 DIAGNOSIS — Z6829 Body mass index (BMI) 29.0-29.9, adult: Secondary | ICD-10-CM

## 2023-11-27 DIAGNOSIS — E66811 Obesity, class 1: Secondary | ICD-10-CM | POA: Diagnosis not present

## 2023-11-27 MED ORDER — METFORMIN HCL ER 500 MG PO TB24
500.0000 mg | ORAL_TABLET | Freq: Two times a day (BID) | ORAL | 0 refills | Status: DC
Start: 2023-11-27 — End: 2024-03-28

## 2023-11-27 NOTE — Progress Notes (Signed)
 Office: [phone]  /  Fax: [phone]  Weight Summary And Biometrics  Vitals Temp: 98 F (36.7 C) BP: (!) 145/89 Pulse Rate: 80 SpO2: 97 %   Anthropometric Measurements Height: 5' 3 (1.6 m) Weight: 169 lb (76.7 kg) BMI (Calculated): 29.94 Weight at Last Visit: 176 lb Weight Lost Since Last Visit: 7 lb Weight Gained Since Last Visit: 0 lb Starting Weight: 224 lb Total Weight Loss (lbs): 55 lb (24.9 kg) Peak Weight: 230 lb   Body Composition  Body Fat %: 43.2 % Fat Mass (lbs): 73.2 lbs Muscle Mass (lbs): 91.4 lbs Total Body Water (lbs): 67.6 lbs Visceral Fat Rating : 12    RMR: 2477  Today's Visit #: 19  Starting Date: 06/29/22   Subjective   Chief Complaint: Obesity  Interval History Discussed the use of AI scribe software for clinical note transcription with the patient, who gave verbal consent to proceed.  History of Present Illness   Shannon Reyes is a 73 year old female who presents for medical weight management.  Since her last visit, she has lost seven pounds and adheres to a 1200 calorie meal plan 90 to 100% of the time. She tracks her calories, consumes more whole foods, ensures adequate protein intake, maintains hydration, and does not skip meals. She exercises once a week for 20 to 30 minutes.  She experiences 'food noise,' characterized by a desire to snack despite not feeling hungry. She craves fresh fruit and has increased her intake of fresh fruits and vegetables. She is concerned about the efficacy of her current medication, Wegovy , and has encountered issues with prescription management.  Her body fat percentage has decreased from 49% to 43%, and she has maintained her muscle mass since February. Her visceral fat has improved, now at 12.   She consumes three meals a day and supplements with a protein shake to ensure adequate protein intake. She takes a high-potency multivitamin daily. She engages in weight lifting at home with a Pilates  bar and walking as forms of exercise.  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 She has not experienced weight regain over the past year and feels her overall health has improved.  She has been on Wegovy  and reports a decrease in side effects, with only occasional nausea. She anticipates needing a refill of metformin  soon.     She gets Wegovy  through employer based virtual program  Challenges affecting patient progress: Weight centric mindset.    Pharmacotherapy for weight management: She is currently taking Wegovy  with adequate clinical response  and without side effects..   Assessment and Plan   Treatment Plan For Obesity:  Recommended Dietary Goals  [name] is currently in the action stage of change. As such, her goal is to continue weight management plan. She has agreed to: follow the Category 2 plan - 1200 kcal per day  Behavioral Health and Counseling  We discussed the following behavioral modification strategies today: continue to work on maintaining a reduced calorie state, getting the recommended amount of protein, incorporating whole foods, making healthy choices, staying well hydrated and practicing mindfulness when eating..  Additional education and resources provided today: None  Recommended Physical Activity Goals  [name] has been advised to work up to 150 minutes of moderate intensity aerobic activity a week and strengthening exercises 2-3 times per week for cardiovascular health, weight loss maintenance and preservation of muscle mass.   She  has agreed to :  We discussed increasing volume of physical activity to 240-300 minutes a week.  Pharmacotherapy  We discussed various medication options to help [name] with her weight loss efforts and we both agreed to : Adequate clinical response to anti-obesity medication, continue current regimen  Associated Conditions Impacted  by Obesity Treatment  Class 1 obesity  Insulin  resistance -     metFORMIN  HCl ER; Take 1 tablet (500 mg total) by mouth 2 (two) times daily with a meal.  Dispense: 180 tablet; Refill: 0     Assessment and Plan    Obesity She is undergoing medical weight management and has lost seven pounds since the last visit. She follows a 1200 calorie meal plan, tracking calories, eating whole foods, maintaining hydration, and exercising once a week. Her body fat percentage has decreased from 49% to 43%, and she has maintained muscle mass. She experiences 'food noise' but is not hungry, and craves fresh fruit. Emphasis is placed on focusing on body composition rather than weight alone, highlighting improvements in body composition and overall health. She is encouraged to focus on maintaining muscle mass while reducing body fat. The goal is to reach 160 pounds and then focus on maintenance. For maintenance, she should increase physical activity to 240-300 minutes per week and consider working with a fitness trainer to develop a routine for strengthening and conditioning. - Continue dietary plan with 1200 calories per day, ensuring 90 grams of protein and 25 grams of fiber daily. - Increase physical activity to 240-300 minutes per week for maintenance. - Consider working with a Chief Executive Officer for strengthening and conditioning. - Continue Wegovy  for weight management. - Monitor progress towards a goal weight of 160 pounds, then focus on maintenance.  Insulin  resistance Has improved continue Wegovy  and metformin  for pharmacoprophylaxis.  Medication refilled  Medication Side Effects She reports experiencing nausea as a side effect of Wegovy , but notes that the side effects have decreased over time. She is reassured that she is not plateauing in her weight loss journey. Wegovy  can be continued long-term for maintenance, and adjustments can be made if necessary. - Continue Wegovy  as prescribed. - Monitor for  side effects and report any significant changes.  General Health Maintenance She is taking a high-potency multivitamin daily and is advised to ensure adequate intake of protein, fiber, and hydration to support overall health and weight management. She is reminded that fiber helps with satiety and constipation, and should aim for 25 grams of fiber daily from whole grains, fruits, and vegetables. - Continue taking a high-potency multivitamin daily. - Ensure adequate hydration and intake of whole grains, fruits, and vegetables for fiber.        Objective   Physical Exam:  Blood pressure (!) 145/89, pulse 80, temperature 98 F (36.7 C), height 5' 3 (1.6 m), weight 169 lb (76.7 kg), SpO2 97%. Body mass index is 29.94 kg/m.  General: She is overweight, cooperative, alert, well developed, and in no acute distress. PSYCH: Has normal mood, affect and thought process.   HEENT: EOMI, sclerae are anicteric. Lungs: Normal breathing effort, no conversational dyspnea. Extremities: No edema.  Neurologic: No gross sensory or motor deficits. No tremors or fasciculations noted.    Diagnostic Data Reviewed:  BMET    Component Value Date/Time   NA 134 09/19/2023 1242   K 3.8 09/19/2023 1242   CL 95 (L) 09/19/2023 1242   CO2 23 09/19/2023 1242   GLUCOSE 84 09/19/2023 1242   GLUCOSE 91 08/21/2023  1329   BUN 18 09/19/2023 1242   CREATININE 0.58 09/19/2023 1242   CREATININE 0.66 04/06/2016 0931   CALCIUM 9.9 09/19/2023 1242   GFRNONAA >60 06/20/2018 0419   GFRAA >60 06/20/2018 0419   Lab Results  Component Value Date   HGBA1C 5.3 06/29/2022   HGBA1C 4.9 06/15/2018   Lab Results  Component Value Date   INSULIN  32.5 (H) 06/29/2022   Lab Results  Component Value Date   TSH 0.92 08/21/2023   CBC    Component Value Date/Time   WBC 7.1 06/29/2022 1050   WBC 6.7 06/22/2018 0434   RBC 4.46 06/29/2022 1050   RBC 3.64 (L) 06/22/2018 0434   HGB 14.4 06/29/2022 1050   HCT 42.4 06/29/2022  1050   PLT 254 06/29/2022 1050   MCV 95 06/29/2022 1050   MCH 32.3 06/29/2022 1050   MCH 31.3 06/22/2018 0434   MCHC 34.0 06/29/2022 1050   MCHC 31.1 06/22/2018 0434   RDW 12.0 06/29/2022 1050   Iron Studies No results found for: IRON, TIBC, FERRITIN, IRONPCTSAT Lipid Panel     Component Value Date/Time   CHOL 179 06/29/2022 1050   TRIG 129 06/29/2022 1050   HDL 57 06/29/2022 1050   LDLCALC 99 06/29/2022 1050   Hepatic Function Panel     Component Value Date/Time   PROT 6.9 08/21/2023 1329   PROT 7.2 06/29/2022 1050   ALBUMIN 4.2 08/21/2023 1329   ALBUMIN 4.7 06/29/2022 1050   AST 15 08/21/2023 1329   ALT 20 08/21/2023 1329   ALKPHOS 40 08/21/2023 1329   BILITOT 0.3 08/21/2023 1329   BILITOT 0.3 06/29/2022 1050      Component Value Date/Time   TSH 0.92 08/21/2023 1329   Nutritional Lab Results  Component Value Date   VD25OH 42.1 06/29/2022    Medications: Outpatient Encounter Medications as of 11/27/2023  Medication Sig   ARMOUR THYROID  120 MG tablet TAKE 1 TABLET BY MOUTH DAILY BEFORE BREAKFAST   ARMOUR THYROID  15 MG tablet TAKE 1 TABLET BY MOUTH DAILY BEFORE BREAKFAST   buPROPion  (WELLBUTRIN  XL) 300 MG 24 hr tablet Take 300 mg by mouth daily.   chlorthalidone  (HYGROTON ) 25 MG tablet TAKE 1 TABLET BY MOUTH DAILY   dicyclomine  (BENTYL ) 10 MG capsule Take 1 capsule (10 mg total) by mouth 4 (four) times daily -  before meals and at bedtime.   estradiol  (ESTRACE ) 1 MG tablet TAKE 1 TABLET BY MOUTH DAILY   EVEKEO 10 MG TABS Take 10 mg by mouth daily as needed (ADHD).    lisinopril  (ZESTRIL ) 40 MG tablet Take 1 tablet (40 mg total) by mouth 2 (two) times daily.   mirabegron ER (MYRBETRIQ) 50 MG TB24 tablet Take 50 mg by mouth daily.   Multiple Vitamins-Minerals (MULTIVITAMIN) tablet Take 1 tablet by mouth daily.   omeprazole  (PRILOSEC) 20 MG capsule Take 1 capsule (20 mg total) by mouth daily.   ondansetron  (ZOFRAN -ODT) 4 MG disintegrating tablet Take 1  tablet (4 mg total) by mouth every 8 (eight) hours as needed for nausea or vomiting.   traZODone (DESYREL) 50 MG tablet Take 50 mg by mouth at bedtime.   triamcinolone  cream (KENALOG ) 0.1 % Apply 1 Application topically daily as needed.   WEGOVY  2.4 MG/0.75ML SOAJ Inject 2.4 mg into the skin once a week.   WEGOVY  2.4 MG/0.75ML SOAJ Inject 2.4 mg into the skin every 7 (seven) days.   WEGOVY  2.4 MG/0.75ML SOAJ Inject 2.4 mg as directed once a week for  4 weeks.   [DISCONTINUED] metFORMIN  (GLUCOPHAGE -XR) 500 MG 24 hr tablet TAKE 1 TABLET BY MOUTH TWICE A DAY WITH A MEAL   metFORMIN  (GLUCOPHAGE -XR) 500 MG 24 hr tablet Take 1 tablet (500 mg total) by mouth 2 (two) times daily with a meal.   No facility-administered encounter medications on file as of 11/27/2023.     Follow-Up   Return in about 8 weeks (around 01/22/2024) for For Weight Mangement with Dr. Allie Area.Aaron Aas She was informed of the importance of frequent follow up visits to maximize her success with intensive lifestyle modifications for her multiple health conditions.  Attestation Statement   Reviewed by clinician on day of visit: allergies, medications, problem list, medical history, surgical history, family history, social history, and previous encounter notes.     Ladd Picker, MD

## 2023-11-29 ENCOUNTER — Other Ambulatory Visit (HOSPITAL_COMMUNITY): Payer: Self-pay

## 2023-11-29 MED ORDER — WEGOVY 2.4 MG/0.75ML ~~LOC~~ SOAJ
2.4000 mg | SUBCUTANEOUS | 0 refills | Status: DC
  Filled 2023-11-29: qty 3, 28d supply, fill #0

## 2023-12-02 ENCOUNTER — Other Ambulatory Visit (HOSPITAL_COMMUNITY): Payer: Self-pay

## 2023-12-18 ENCOUNTER — Other Ambulatory Visit: Payer: Self-pay

## 2023-12-18 ENCOUNTER — Other Ambulatory Visit (HOSPITAL_COMMUNITY): Payer: Self-pay

## 2023-12-18 MED ORDER — WEGOVY 1.7 MG/0.75ML ~~LOC~~ SOAJ
1.7000 mg | SUBCUTANEOUS | 0 refills | Status: DC
  Filled 2023-12-18: qty 3, 28d supply, fill #0

## 2024-01-10 ENCOUNTER — Other Ambulatory Visit (HOSPITAL_COMMUNITY): Payer: Self-pay

## 2024-01-10 MED ORDER — WEGOVY 1.7 MG/0.75ML ~~LOC~~ SOAJ
1.7000 mg | SUBCUTANEOUS | 0 refills | Status: DC
  Filled 2024-01-10: qty 3, 28d supply, fill #0

## 2024-01-22 ENCOUNTER — Ambulatory Visit (INDEPENDENT_AMBULATORY_CARE_PROVIDER_SITE_OTHER): Admitting: Internal Medicine

## 2024-01-24 ENCOUNTER — Other Ambulatory Visit: Payer: Self-pay | Admitting: Family Medicine

## 2024-01-24 DIAGNOSIS — I10 Essential (primary) hypertension: Secondary | ICD-10-CM

## 2024-02-06 ENCOUNTER — Other Ambulatory Visit: Payer: Self-pay | Admitting: Family Medicine

## 2024-02-06 ENCOUNTER — Other Ambulatory Visit (HOSPITAL_COMMUNITY): Payer: Self-pay

## 2024-02-06 DIAGNOSIS — R928 Other abnormal and inconclusive findings on diagnostic imaging of breast: Secondary | ICD-10-CM

## 2024-02-06 MED ORDER — WEGOVY 1.7 MG/0.75ML ~~LOC~~ SOAJ
1.7000 mg | SUBCUTANEOUS | 0 refills | Status: DC
  Filled 2024-02-06: qty 3, 28d supply, fill #0

## 2024-02-09 ENCOUNTER — Other Ambulatory Visit

## 2024-02-09 ENCOUNTER — Other Ambulatory Visit (HOSPITAL_COMMUNITY): Payer: Self-pay

## 2024-02-09 ENCOUNTER — Ambulatory Visit
Admission: RE | Admit: 2024-02-09 | Discharge: 2024-02-09 | Disposition: A | Discharge status: Home / Self Care | Source: Ambulatory Visit | Attending: Family Medicine | Admitting: Family Medicine

## 2024-02-09 DIAGNOSIS — R928 Other abnormal and inconclusive findings on diagnostic imaging of breast: Secondary | ICD-10-CM

## 2024-02-14 ENCOUNTER — Other Ambulatory Visit: Payer: Self-pay | Admitting: Family Medicine

## 2024-02-14 DIAGNOSIS — R921 Mammographic calcification found on diagnostic imaging of breast: Secondary | ICD-10-CM

## 2024-02-19 ENCOUNTER — Ambulatory Visit (INDEPENDENT_AMBULATORY_CARE_PROVIDER_SITE_OTHER): Admitting: Internal Medicine

## 2024-02-19 ENCOUNTER — Encounter (INDEPENDENT_AMBULATORY_CARE_PROVIDER_SITE_OTHER): Payer: Self-pay | Admitting: Internal Medicine

## 2024-02-19 VITALS — BP 147/88 | HR 84 | Temp 98.2°F | Ht 63.0 in | Wt 165.0 lb

## 2024-02-19 DIAGNOSIS — E66811 Obesity, class 1: Secondary | ICD-10-CM | POA: Diagnosis not present

## 2024-02-19 DIAGNOSIS — R638 Other symptoms and signs concerning food and fluid intake: Secondary | ICD-10-CM | POA: Diagnosis not present

## 2024-02-19 DIAGNOSIS — Z6829 Body mass index (BMI) 29.0-29.9, adult: Secondary | ICD-10-CM

## 2024-02-19 DIAGNOSIS — Z9189 Other specified personal risk factors, not elsewhere classified: Secondary | ICD-10-CM

## 2024-02-19 DIAGNOSIS — I10 Essential (primary) hypertension: Secondary | ICD-10-CM

## 2024-02-19 NOTE — Progress Notes (Unsigned)
 Office: [phone]  /  Fax: [phone]  Weight Summary and Body Composition Analysis (BIA)  Vitals Temp: 98.2 F (36.8 C) BP: (!) 147/88 Pulse Rate: 84 SpO2: 98 %   Anthropometric Measurements Height: 5' 3 (1.6 m) Weight: 165 lb (74.8 kg) BMI (Calculated): 29.24 Weight at Last Visit: 169 lb Weight Lost Since Last Visit: 3 lb Weight Gained Since Last Visit: 0 lb Starting Weight: 224 lb Total Weight Loss (lbs): 59 lb (26.8 kg) Peak Weight: 230 lb   Body Composition  Body Fat %: 44.2 % Fat Mass (lbs): 73 lbs Muscle Mass (lbs): 87.4 lbs Total Body Water (lbs): 67.2 lbs Visceral Fat Rating : 12    RMR: 2477  Today's Visit #: 20  Starting Date: 06/29/22   Subjective   Chief Complaint: Obesity  Interval History Discussed the use of AI scribe software for clinical note transcription with the patient, who gave verbal consent to proceed.  History of Present Illness Shannon Reyes is a 73 year old female who presents for medical weight management follow-up.  Since her last visit, she has lost three pounds and adheres to a 1200 calorie nutrition plan, tracking her intake, consuming more whole foods, ensuring adequate protein intake, maintaining hydration, and not skipping meals.  She exercises three to five days a week for ten to twenty minutes. Recently, she returned from a ten-day cruise and two days in [place], during which she lost three pounds despite daily dessert consumption, attributing the weight loss to increased walking on the cruise ship.  She reports a peak weight of 232 pounds and a total weight loss of 67 pounds since starting her journey.  She is currently on 1.7 mg of Wegovy , having reduced from 2.4 mg due to vomiting.  This is being administered through an employer based weight management plan.  She plans to maintain this dosage until December. She aims to lose an additional ten pounds and has incorporated weightlifting and resistance  training along with regular walking. She feels significantly better and more active, with her husband noting a substantial positive change in her demeanor and energy levels.  She has a family history of heart disease, with her father having had valve replacements and strokes. She has undergone evaluations to assess her cardiovascular risk, including a cardiologist visit where no significant issues were found.  Her blood pressure has been elevated, attributed to work stress, but normalizes during vacations. She is on multiple medications, including Trazodone, Myrbetriq, Wellbutrin , and Evekeo, which may contribute to elevated blood pressure. She monitors her blood pressure regularly at home. No skipping meals and maintains adequate hydration. Experiences vomiting on a higher dose of Wegovy .     Challenges affecting patient progress: menopause.    Pharmacotherapy for weight management: She is currently taking Wegovy  with adequate clinical response , without side effects., and had been on Wegovy  2.4 but medication was reduced due to nausea and vomiting symptoms have improved and will the 1.7 mg dose.   Assessment and Plan   Treatment Plan For Obesity:  Recommended Dietary Goals  Shannon Reyes is currently in the action stage of change. As such, her goal is to continue weight management plan. She has agreed to: continue current plan  Behavioral Health and Counseling  We discussed the following behavioral modification strategies today: continue to work on maintaining a reduced calorie state, getting the recommended amount of protein, incorporating whole foods, making healthy choices, staying well hydrated and practicing mindfulness when eating. and increase protein intake,  fibrous foods (25 grams per day for women, 30 grams for men) and water to improve satiety and decrease hunger signals. .  Additional education and resources provided today: None  Recommended Physical Activity Goals  Shannon Reyes has been  advised to work up to 150 minutes of moderate intensity aerobic activity a week and strengthening exercises 2-3 times per week for cardiovascular health, weight loss maintenance and preservation of muscle mass.  She has agreed to :  Think about enjoyable ways to increase daily physical activity and overcoming barriers to exercise, Increase physical activity in their day and reduce sedentary time (increase NEAT)., Increase volume of physical activity to a goal of 240 minutes a week, and Combine aerobic and strengthening exercises for efficiency and improved cardiometabolic health.  Medical Interventions and Pharmacotherapy  We discussed various medication options to help Shannon Reyes with her weight loss efforts and we both agreed to : Adequate clinical response to anti-obesity medication, continue current regimen  Associated Conditions Impacted by Obesity Treatment  Assessment & Plan Essential hypertension Blood pressure elevated, particularly related to work stress. Medications including trazodone, Myrbetriq, Wellbutrin , and Evekeo may contribute to elevated blood pressure. Estradiol  use noted to increase stroke risk. Blood pressure normal during vacation, indicating stress-related elevation. - Monitor blood pressure at home twice daily, morning and evening - Bring blood pressure readings to upcoming appointment with Doctor Thedora - Discuss potential medication adjustments with Doctor Thedora if blood pressure remains elevated Class 1 obesity Abnormal food appetite Weight: decrease of 67 lb (28.9%) over 1 year, 6 months  Start: 08/15/2022 232 lb (105.2 kg)  End: 02/19/2024 165 lb (74.8 kg)   Total weight loss of 67 pounds, approximately 28.9% of body weight. Current regimen includes a 1200 calorie nutrition plan, regular exercise, and Wegovy  at 1.7 mg due to side effects at higher dose.  - Continue current 1200 calorie nutrition plan - Maintain exercise routine, aiming for 240 minutes per week - Ensure  protein intake of 90-120 grams per day - Consider pre-meal protein snacks before lunch and dinner - Plan for gradual tapering off Wegovy  - Discuss potential financial planning for medication costs post-Medicare transition - Consider genetic testing for obesity phenotype if applicable At increased risk for cardiovascular disease Patient had been seen by cardiologist to assess cardiovascular risk but she is concerned about having a systolic murmur without any imaging is requesting additional risk stratification.  Her 10-year risk is about 17.5% she has hypertension family history of stroke she is asymptomatic.  It may be worthwhile getting an echocardiogram to assess for aortic stenosis.  She may derive benefit from improved blood pressure control and moderate statin therapy.  She is interested in having a CAC score to help with restratification.  We will discuss further at the next visit    The 10-year ASCVD risk score (Arnett DK, et al., 2019) is: 17.5%    Objective   Physical Exam:  Blood pressure (!) 147/88, pulse 84, temperature 98.2 F (36.8 C), height 5' 3 (1.6 m), weight 165 lb (74.8 kg), SpO2 98%. Body mass index is 29.23 kg/m.  General: She is overweight, cooperative, alert, well developed, and in no acute distress. PSYCH: Has normal mood, affect and thought process.   HEENT: EOMI, sclerae are anicteric. Lungs: Normal breathing effort, no conversational dyspnea. Extremities: No edema.  Neurologic: No gross sensory or motor deficits. No tremors or fasciculations noted.    Diagnostic Data Reviewed:  BMET    Component Value Date/Time   NA 134 09/19/2023  1242   K 3.8 09/19/2023 1242   CL 95 (L) 09/19/2023 1242   CO2 23 09/19/2023 1242   GLUCOSE 84 09/19/2023 1242   GLUCOSE 91 08/21/2023 1329   BUN 18 09/19/2023 1242   CREATININE 0.58 09/19/2023 1242   CREATININE 0.66 04/06/2016 0931   CALCIUM 9.9 09/19/2023 1242   GFRNONAA >60 06/20/2018 0419   GFRAA >60 06/20/2018  0419   Lab Results  Component Value Date   HGBA1C 5.3 06/29/2022   HGBA1C 4.9 06/15/2018   Lab Results  Component Value Date   INSULIN  32.5 (H) 06/29/2022   Lab Results  Component Value Date   TSH 0.92 08/21/2023   CBC    Component Value Date/Time   WBC 7.1 06/29/2022 1050   WBC 6.7 06/22/2018 0434   RBC 4.46 06/29/2022 1050   RBC 3.64 (L) 06/22/2018 0434   HGB 14.4 06/29/2022 1050   HCT 42.4 06/29/2022 1050   PLT 254 06/29/2022 1050   MCV 95 06/29/2022 1050   MCH 32.3 06/29/2022 1050   MCH 31.3 06/22/2018 0434   MCHC 34.0 06/29/2022 1050   MCHC 31.1 06/22/2018 0434   RDW 12.0 06/29/2022 1050   Iron Studies No results found for: IRON, TIBC, FERRITIN, IRONPCTSAT Lipid Panel     Component Value Date/Time   CHOL 179 06/29/2022 1050   TRIG 129 06/29/2022 1050   HDL 57 06/29/2022 1050   LDLCALC 99 06/29/2022 1050   Hepatic Function Panel     Component Value Date/Time   PROT 6.9 08/21/2023 1329   PROT 7.2 06/29/2022 1050   ALBUMIN 4.2 08/21/2023 1329   ALBUMIN 4.7 06/29/2022 1050   AST 15 08/21/2023 1329   ALT 20 08/21/2023 1329   ALKPHOS 40 08/21/2023 1329   BILITOT 0.3 08/21/2023 1329   BILITOT 0.3 06/29/2022 1050      Component Value Date/Time   TSH 0.92 08/21/2023 1329   Nutritional Lab Results  Component Value Date   VD25OH 42.1 06/29/2022    Medications: Outpatient Encounter Medications as of 02/19/2024  Medication Sig   ARMOUR THYROID  120 MG tablet TAKE 1 TABLET BY MOUTH DAILY BEFORE BREAKFAST   ARMOUR THYROID  15 MG tablet TAKE 1 TABLET BY MOUTH DAILY BEFORE BREAKFAST   buPROPion  (WELLBUTRIN  XL) 300 MG 24 hr tablet Take 300 mg by mouth daily.   chlorthalidone  (HYGROTON ) 25 MG tablet TAKE 1 TABLET BY MOUTH DAILY   dicyclomine  (BENTYL ) 10 MG capsule Take 1 capsule (10 mg total) by mouth 4 (four) times daily -  before meals and at bedtime.   estradiol  (ESTRACE ) 1 MG tablet TAKE 1 TABLET BY MOUTH DAILY   EVEKEO 10 MG TABS Take 10 mg by  mouth daily as needed (ADHD).    lisinopril  (ZESTRIL ) 40 MG tablet TAKE 1 TABLET BY MOUTH 2 TIMES A DAY   metFORMIN  (GLUCOPHAGE -XR) 500 MG 24 hr tablet Take 1 tablet (500 mg total) by mouth 2 (two) times daily with a meal.   mirabegron ER (MYRBETRIQ) 50 MG TB24 tablet Take 50 mg by mouth daily.   Multiple Vitamins-Minerals (MULTIVITAMIN) tablet Take 1 tablet by mouth daily.   omeprazole  (PRILOSEC) 20 MG capsule Take 1 capsule (20 mg total) by mouth daily.   ondansetron  (ZOFRAN -ODT) 4 MG disintegrating tablet Take 1 tablet (4 mg total) by mouth every 8 (eight) hours as needed for nausea or vomiting.   traZODone (DESYREL) 50 MG tablet Take 50 mg by mouth at bedtime.   triamcinolone  cream (KENALOG ) 0.1 % Apply  1 Application topically daily as needed.   WEGOVY  1.7 MG/0.75ML SOAJ SQ injection Inject 1.7 mg into the skin once a week.   WEGOVY  2.4 MG/0.75ML SOAJ Inject 2.4 mg into the skin once a week.   WEGOVY  2.4 MG/0.75ML SOAJ Inject 2.4 mg into the skin every 7 (seven) days.   WEGOVY  2.4 MG/0.75ML SOAJ Inject 2.4 mg as directed once a week for 4 weeks.   WEGOVY  2.4 MG/0.75ML SOAJ Inject 2.4 mg into the skin once a week.   No facility-administered encounter medications on file as of 02/19/2024.     Follow-Up   No follow-ups on file.SABRA She was informed of the importance of frequent follow up visits to maximize her success with intensive lifestyle modifications for her multiple health conditions.  Attestation Statement   Reviewed by clinician on day of visit: allergies, medications, problem list, medical history, surgical history, family history, social history, and previous encounter notes.     Lucas Parker, MD

## 2024-02-20 NOTE — Assessment & Plan Note (Signed)
 Weight: decrease of 67 lb (28.9%) over 1 year, 6 months  Start: 08/15/2022 232 lb (105.2 kg)  End: 02/19/2024 165 lb (74.8 kg)   Total weight loss of 67 pounds, approximately 28.9% of body weight. Current regimen includes a 1200 calorie nutrition plan, regular exercise, and Wegovy  at 1.7 mg due to side effects at higher dose.  - Continue current 1200 calorie nutrition plan - Maintain exercise routine, aiming for 240 minutes per week - Ensure protein intake of 90-120 grams per day - Consider pre-meal protein snacks before lunch and dinner - Plan for gradual tapering off Wegovy  - Discuss potential financial planning for medication costs post-Medicare transition - Consider genetic testing for obesity phenotype if applicable

## 2024-02-20 NOTE — Assessment & Plan Note (Signed)
 Blood pressure elevated, particularly related to work stress. Medications including trazodone, Myrbetriq, Wellbutrin , and Evekeo may contribute to elevated blood pressure. Estradiol  use noted to increase stroke risk. Blood pressure normal during vacation, indicating stress-related elevation. - Monitor blood pressure at home twice daily, morning and evening - Bring blood pressure readings to upcoming appointment with Doctor Thedora - Discuss potential medication adjustments with Doctor Rudd if blood pressure remains elevated

## 2024-02-21 ENCOUNTER — Ambulatory Visit (INDEPENDENT_AMBULATORY_CARE_PROVIDER_SITE_OTHER): Admitting: Family Medicine

## 2024-02-21 ENCOUNTER — Encounter: Payer: Self-pay | Admitting: Family Medicine

## 2024-02-21 VITALS — BP 118/66 | HR 61 | Temp 97.9°F | Ht 63.0 in | Wt 166.8 lb

## 2024-02-21 DIAGNOSIS — I10 Essential (primary) hypertension: Secondary | ICD-10-CM

## 2024-02-21 DIAGNOSIS — E039 Hypothyroidism, unspecified: Secondary | ICD-10-CM | POA: Diagnosis not present

## 2024-02-21 DIAGNOSIS — N951 Menopausal and female climacteric states: Secondary | ICD-10-CM | POA: Diagnosis not present

## 2024-02-21 NOTE — Assessment & Plan Note (Signed)
 TSH is at goal. Continue Armour Thyroid  135 mg TDD daily.

## 2024-02-21 NOTE — Assessment & Plan Note (Signed)
 Maximum weight: 232 lbs (08/2022) Current weight: 166 lbs Weight change since last visit: - 18 lbs Total weight loss: 66 lbs (28.4%)  Continue metformin  500 mg daily and semaglutide  1.7 mg weekly. Continue efforts at improved diet and regular exercise. Follow-up with Dr. Francyne. We discussed options to obtain a GLP-1 RA through direct mail from drug manufacturers. This may be an option as she transitions to Medicare.

## 2024-02-21 NOTE — Progress Notes (Signed)
 [facility] [address] [place] [address] Dept: [phone] Dept Fax: [phone]  Chronic Care Office Visit  Subjective:    Patient ID: Shannon Reyes, female    DOB: 13-Dec-1950, 73 y.o..   MRN: [id number]  Chief Complaint  Patient presents with   Hypertension    6 month f/u.  No concerns.     History of Present Illness:  Patient is in today for reassessment of chronic medical issues.  Ms. Dewing has a history of essential hypertension. She was being managed on chlorthalidone  25 mg daily and lisinopril  40 mg twice daily.   Ms. Dedmon has a history of hypothyroidism. She is on Armour Thyroid  (thyroid  extract) 120 mg + 15 mg (TDD= 135 mg) daily.     Ms. Ehresman is working with Dr. Francyne on weight management. She is currently on metformin  XR 500 mg daily and semaglutide  (Wegovy ) 1.7 mg weekly. She is preparing for retirement and a transition to [employer]) with a supplement.  Past Medical History: Patient Active Problem List   Diagnosis Date Noted   Hypokalemia 09/19/2023   Intraductal papilloma 09/19/2023   Nausea 03/15/2023   Seborrhea 02/20/2023   Stress incontinence 02/16/2023   Abnormal food appetite 02/02/2023   Stress 01/04/2023   Sleep difficulties 01/04/2023   Vasomotor symptoms due to menopause 08/15/2022   Morbid obesity- Initial BMI 41.1 (HCC) 07/27/2022   Insulin  resistance 07/13/2022   Anxiety with depression 10/27/2021   ADD (attention deficit disorder) 10/27/2021   Hypothyroidism 07/12/2021   Diverticulitis of sigmoid colon s/p lap colectomy 06/19/2018 06/23/2018   S/P partial colectomy 06/19/2018   Degeneration of lumbar intervertebral disc 03/30/2018   Perennial allergic rhinitis 03/15/2016   Sensorineural hearing loss (SNHL), bilateral 03/15/2016   Tinnitus, bilateral 03/15/2016   GERD (gastroesophageal reflux disease) 01/25/2013   Essential hypertension 12/31/2012   Systolic  murmur 92/78/7985   Coronary artery fistula 12/31/2012   Past Surgical History:  Procedure Laterality Date   ABDOMINAL HYSTERECTOMY  1981   COMPLETE   ANKLE SURGERY Right 2004/2014   APPENDECTOMY     BLEPHAROPLASTY Left    BREAST BIOPSY Left 09/14/2023   MM LT BREAST BX W LOC DEV 1ST LESION IMAGE BX SPEC STEREO GUIDE 09/14/2023 [facility]   CATARACT EXTRACTION Bilateral    LEFT OCT 2018, RIGHT NOV 2019   CESAREAN SECTION  1977 AND 1980   COLON RESECTION Left 06/19/2018   Procedure: LAPAROSCOPIC ASSISTED  LEFT COLON RESECTION;  Surgeon: Gladis Cough, MD;  Location: [facility];  Service: General;  Laterality: Left;  ERAS PATHWAY   COLON SURGERY  January 2021   COSMETIC SURGERY  June 2021   Eyes   CYSTOSCOPY WITH STENT PLACEMENT Bilateral 06/19/2018   Procedure: CYSTOSCOPY WITH BILATERAL URETERAL CATHETER PLACEMENT;  Surgeon: Watt Rush, MD;  Location: [facility];  Service: Urology;  Laterality: Bilateral;   EYE SURGERY     Cataracts   FRACTURE SURGERY  2010   OVARIAN CYST REMOVAL     TUBAL LIGATION     Family History  Problem Relation Age of Onset   Heart disease Mother    Thyroid  disease Mother    Depression Mother    Anxiety disorder Mother    Obesity Mother    ADD / ADHD Mother    Stroke Father    Hypertension Father    Heart disease Father    High Cholesterol Father    Diabetes Father  Parkinson's disease Maternal Grandmother    Cancer Paternal Grandmother        Pancreatic   Diabetes Paternal Grandfather    Diabetes Paternal Uncle    Breast cancer Neg Hx    Outpatient Medications Prior to Visit  Medication Sig Dispense Refill   ARMOUR THYROID  120 MG tablet TAKE 1 TABLET BY MOUTH DAILY BEFORE BREAKFAST 30 tablet 11   ARMOUR THYROID  15 MG tablet TAKE 1 TABLET BY MOUTH DAILY BEFORE BREAKFAST 90 tablet 3   buPROPion  (WELLBUTRIN  XL) 300 MG 24 hr tablet Take 300 mg by mouth daily.     EVEKEO 10 MG TABS Take 10 mg by mouth daily as needed (ADHD).   0   lisinopril   (ZESTRIL ) 40 MG tablet TAKE 1 TABLET BY MOUTH 2 TIMES A DAY 180 tablet 3   metFORMIN  (GLUCOPHAGE -XR) 500 MG 24 hr tablet Take 1 tablet (500 mg total) by mouth 2 (two) times daily with a meal. 180 tablet 0   mirabegron ER (MYRBETRIQ) 50 MG TB24 tablet Take 50 mg by mouth daily.     Multiple Vitamins-Minerals (MULTIVITAMIN) tablet Take 1 tablet by mouth daily.     omeprazole  (PRILOSEC) 20 MG capsule Take 1 capsule (20 mg total) by mouth daily. 90 capsule 3   ondansetron  (ZOFRAN -ODT) 4 MG disintegrating tablet Take 1 tablet (4 mg total) by mouth every 8 (eight) hours as needed for nausea or vomiting. 30 tablet 0   traZODone (DESYREL) 50 MG tablet Take 50 mg by mouth at bedtime.     triamcinolone  cream (KENALOG ) 0.1 % Apply 1 Application topically daily as needed. 30 g 0   WEGOVY  1.7 MG/0.75ML SOAJ SQ injection Inject 1.7 mg into the skin once a week. 3 mL 0   chlorthalidone  (HYGROTON ) 25 MG tablet TAKE 1 TABLET BY MOUTH DAILY 90 tablet 3   estradiol  (ESTRACE ) 1 MG tablet TAKE 1 TABLET BY MOUTH DAILY 90 tablet 3   dicyclomine  (BENTYL ) 10 MG capsule Take 1 capsule (10 mg total) by mouth 4 (four) times daily -  before meals and at bedtime. (Patient not taking: Reported on 02/21/2024) 120 capsule 1   WEGOVY  2.4 MG/0.75ML SOAJ Inject 2.4 mg into the skin once a week. 3 mL 0   WEGOVY  2.4 MG/0.75ML SOAJ Inject 2.4 mg into the skin every 7 (seven) days. 3 mL 0   WEGOVY  2.4 MG/0.75ML SOAJ Inject 2.4 mg as directed once a week for 4 weeks. 3 mL 0   WEGOVY  2.4 MG/0.75ML SOAJ Inject 2.4 mg into the skin once a week. 3 mL 0   No facility-administered medications prior to visit.   Allergies  Allergen Reactions   Erythromycin Base Itching   Codeine Itching   Objective:   Today's Vitals   02/21/24 1311  BP: 118/66  Pulse: 61  Temp: 97.9 F (36.6 C)  TempSrc: Temporal  SpO2: 98%  Weight: 166 lb 12.8 oz (75.7 kg)  Height: 5' 3 (1.6 m)   Body mass index is 29.55 kg/m.   General: Well developed,  well nourished. No acute distress. Psych: Alert and oriented. Normal mood and affect.  Health Maintenance Due  Topic Date Due   Hepatitis C Screening  Never done   Zoster Vaccines- Shingrix (1 of 2) Never done     Assessment & Plan:   Problem List Items Addressed This Visit       Cardiovascular and Mediastinum   Essential hypertension - Primary   Blood pressure is in good control. Continue  lisinopril  40 mg bid. Now that her BMI is under 30, we will try a trial off of her chlorthalidone . She will monitor her BP at home and let me know if her SBP is > 130.        Endocrine   Hypothyroidism   TSH is at goal. Continue Armour Thyroid  135 mg TDD daily.        Other   Morbid obesity- Initial BMI 41.1 (HCC)   Maximum weight: 232 lbs (08/2022) Current weight: 166 lbs Weight change since last visit: - 18 lbs Total weight loss: 66 lbs (28.4%)  Continue metformin  500 mg daily and semaglutide  1.7 mg weekly. Continue efforts at improved diet and regular exercise. Follow-up with Dr. Francyne. We discussed options to obtain a GLP-1 RA through direct mail from drug manufacturers. This may be an option as she transitions to Medicare.      Vasomotor symptoms due to menopause   Patient has noted no benefit from taking a daily estrogen. She would like to stop this.       Return in about 6 months (around 08/20/2024) for Reassessment.   Garnette CHRISTELLA Simpler, MD

## 2024-02-21 NOTE — Assessment & Plan Note (Signed)
 Patient has noted no benefit from taking a daily estrogen. She would like to stop this.

## 2024-02-21 NOTE — Assessment & Plan Note (Signed)
 Blood pressure is in good control. Continue lisinopril  40 mg bid. Now that her BMI is under 30, we will try a trial off of her chlorthalidone . She will monitor her BP at home and let me know if her SBP is > 130.

## 2024-03-05 ENCOUNTER — Other Ambulatory Visit (HOSPITAL_COMMUNITY): Payer: Self-pay

## 2024-03-05 MED ORDER — WEGOVY 2.4 MG/0.75ML ~~LOC~~ SOAJ
2.4000 mg | SUBCUTANEOUS | 0 refills | Status: DC
  Filled 2024-03-05: qty 3, 28d supply, fill #0

## 2024-03-19 ENCOUNTER — Encounter (INDEPENDENT_AMBULATORY_CARE_PROVIDER_SITE_OTHER): Payer: Self-pay | Admitting: Internal Medicine

## 2024-03-19 ENCOUNTER — Ambulatory Visit (INDEPENDENT_AMBULATORY_CARE_PROVIDER_SITE_OTHER): Admitting: Internal Medicine

## 2024-03-19 VITALS — BP 155/96 | HR 63 | Temp 97.8°F | Ht 63.0 in | Wt 162.0 lb

## 2024-03-19 DIAGNOSIS — Z9189 Other specified personal risk factors, not elsewhere classified: Secondary | ICD-10-CM | POA: Insufficient documentation

## 2024-03-19 DIAGNOSIS — E663 Overweight: Secondary | ICD-10-CM | POA: Insufficient documentation

## 2024-03-19 DIAGNOSIS — I10 Essential (primary) hypertension: Secondary | ICD-10-CM

## 2024-03-19 DIAGNOSIS — R011 Cardiac murmur, unspecified: Secondary | ICD-10-CM

## 2024-03-19 DIAGNOSIS — R9431 Abnormal electrocardiogram [ECG] [EKG]: Secondary | ICD-10-CM

## 2024-03-19 DIAGNOSIS — E669 Obesity, unspecified: Secondary | ICD-10-CM | POA: Insufficient documentation

## 2024-03-19 DIAGNOSIS — Z6828 Body mass index (BMI) 28.0-28.9, adult: Secondary | ICD-10-CM | POA: Insufficient documentation

## 2024-03-19 NOTE — Assessment & Plan Note (Signed)
 Weight: decrease of 64 lb (28.3%) over 1 year, 2 months  Start: 01/04/2023 226 lb (102.5 kg)  End: 03/19/2024 162 lb (73.5 kg)  Continue reduced calorie nutrition plan targeting 1200 cal Maintain protein intake to at least 90 g of protein daily Consider reductions in GLP-1 due to loss of interest in food.  Medication has been adjusted by workplace program Again encouraged to engage in strengthening exercises for muscle mass preservation. Patient goal is to lose an additional 10 pounds and then focus on maintaining.

## 2024-03-19 NOTE — Assessment & Plan Note (Signed)
 Intermittent elevated blood pressure readings, possibly due to medications (Myrbetriq, Evekeo and Wellbutrin ) she does no longer take trazodone.  These can raise blood pressure. No consistent high readings at home, suggesting possible white coat hypertension.  She is currently on lisinopril  40 mg a day without any adverse effects. - Continue lisinopril  - Monitor blood pressure at home three times a week, in the morning and at bedtime, to assess trends. - Consider medication adjustment if sustained high blood pressure is observed. -Consider reductions in Wellbutrin  or Myrbetriq by primary care team if her blood pressures at home are elevated.

## 2024-03-19 NOTE — Progress Notes (Signed)
 The 10-year ASCVD risk score (Arnett DK, et al., 2019) is: 19.3%   Office: [phone]  /  Fax: [phone]  Weight Summary and Body Composition Analysis (BIA)  Vitals Temp: 97.8 F (36.6 C) BP: (!) 155/96 Pulse Rate: 63 SpO2: 98 %   Anthropometric Measurements Height: 5' 3 (1.6 m) Weight: 162 lb (73.5 kg) BMI (Calculated): 28.7 Weight at Last Visit: 165 lb Weight Lost Since Last Visit: 3 lb Weight Gained Since Last Visit: 0 lb Starting Weight: 224 lb Total Weight Loss (lbs): 62 lb (28.1 kg) Peak Weight: 230 lb   Body Composition  Body Fat %: 42.1 % Fat Mass (lbs): 68.2 lbs Muscle Mass (lbs): 89 lbs Total Body Water (lbs): 65.6 lbs Visceral Fat Rating : 11    RMR: [id number]  Today's Visit #: 21  Starting Date: 06/29/22   Subjective   Chief Complaint: Obesity  Interval History Discussed the use of AI scribe software for clinical note transcription with the patient, who gave verbal consent to proceed.  History of Present Illness Shannon Reyes is a 73 year old female with hypertension who presents for weight management and heart health evaluation.  She has inconsistent blood pressure readings, with higher values in the clinic and normal readings at home, which she attributes to 'white coat syndrome.' She is currently taking Evekeo, Myrbetriq and Wellbutrin  300 mg. She has stopped taking Trazodone. She does not monitor her blood pressure regularly at home.  She has a long-standing heart murmur, first noted at age 65.  Her father had heart valve replacement surgery in his 9s due to fainting spells and later developed heart disease and strokes. Her mother had a heart attack at around 37 years old. She has not had an echocardiogram since 2017, which showed normal systolic function and no stenosis.  She experiences dizziness when bending over, particularly when working in the yard, but has no symptoms of syncope.  She has a history of high blood pressure since her  50s, despite not being under significant stress. She has not been on cholesterol medication since 2004, as it was deemed unnecessary. Her LDL was 99 in 2024.   She has been focusing on weight management and has recently increased her protein intake with high-protein drinks, resulting in a decrease in body fat and an increase in muscle mass. She is currently on a 2.4 mg weekly dose of a weight management medication. She feels some burnout with dietary restrictions and a loss of interest in food, noting that 'nothing even sounds good.'   Challenges affecting patient progress: Dieting fatigue and loss of pleasure in eating.    Pharmacotherapy for weight management: She is currently taking Wegovy  with adequate clinical response  and experiencing the following side effects: muscle loss and loss of pleasure eating medication is being adjusted by workplace program.   Assessment and Plan   Treatment Plan For Obesity:  Recommended Dietary Goals  Velna is currently in the action stage of change. As such, her goal is to continue weight management plan. She has agreed to: follow the Category 2 plan - 1200 kcal per day  Behavioral Health and Counseling  We discussed the following behavioral modification strategies today: continue to work on maintaining a reduced calorie state, getting the recommended amount of protein, incorporating whole foods, making healthy choices, staying well hydrated and practicing mindfulness when eating. and increase protein intake, fibrous foods (25 grams per day for women, 30 grams for men) and water to improve satiety and decrease  hunger signals. .  Additional education and resources provided today: None  Recommended Physical Activity Goals  Katisha has been advised to work up to 150 minutes of moderate intensity aerobic activity a week and strengthening exercises 2-3 times per week for cardiovascular health, weight loss maintenance and preservation of muscle mass.  She has  agreed to :  Increase the intensity, frequency or duration of strengthening exercises   Medical Interventions and Pharmacotherapy  We discussed various medication options to help Erdine with her weight loss efforts and we both agreed to : Patient has lost interest in eating.  This time around she has not lost any muscles she has increased her protein intake but food does not appeal to her anymore.  Her medication is being adjusted by her workplace program.  I have advised not to go up on the medication previously out of concern about inadequate caloric intake.  She would like to lose an additional 10 pounds.  Associated Conditions Impacted by Obesity Treatment  Assessment & Plan Essential hypertension Intermittent elevated blood pressure readings, possibly due to medications (Myrbetriq, Evekeo and Wellbutrin ) she does no longer take trazodone.  These can raise blood pressure. No consistent high readings at home, suggesting possible white coat hypertension.  She is currently on lisinopril  40 mg a day without any adverse effects. - Continue lisinopril  - Monitor blood pressure at home three times a week, in the morning and at bedtime, to assess trends. - Consider medication adjustment if sustained high blood pressure is observed. -Consider reductions in Wellbutrin  or Myrbetriq by primary care team if her blood pressures at home are elevated. At increased risk for cardiovascular disease Her cardiovascular risk is close to 20% she is currently not on statin therapy and her blood pressure in the office is elevated.  She has family history of coronary artery disease had been on statin in the past but is not taking statins anymore.  I feel that she would benefit from a coronary artery calcium score for the presence of atherosclerotic disease.  She will be paying for test out-of-pocket. Systolic murmur Patient expresses concern regarding systolic ejection murmur.  On exam she has a soft early systolic  ejection murmur heard at the left sternal border without radiation to her carotids.  Carotid upstrokes are normal.  She has regular rate and rhythm.  Because of reports of near syncope bending we will obtain an ultrasound of her heart.  This will also help evaluate for LVH and diastolic heart failure. Generalized obesity -starting BMI of 41 BMI 28.0-28.9,adult Weight: decrease of 64 lb (28.3%) over 1 year, 2 months  Start: 01/04/2023 226 lb (102.5 kg)  End: 03/19/2024 162 lb (73.5 kg)  Continue reduced calorie nutrition plan targeting 1200 cal Maintain protein intake to at least 90 g of protein daily Consider reductions in GLP-1 due to loss of interest in food.  Medication has been adjusted by workplace program Again encouraged to engage in strengthening exercises for muscle mass preservation. Patient goal is to lose an additional 10 pounds and then focus on maintaining.            Objective   Physical Exam:  Blood pressure (!) 155/96, pulse 63, temperature 97.8 F (36.6 C), height 5' 3 (1.6 m), weight 162 lb (73.5 kg), SpO2 98%. Body mass index is 28.7 kg/m.  General: She is overweight, cooperative, alert, well developed, and in no acute distress. PSYCH: Has normal mood, affect and thought process.   HEENT: EOMI, sclerae are anicteric.  Lungs: Normal breathing effort, no conversational dyspnea. Extremities: No edema.  Neurologic: No gross sensory or motor deficits. No tremors or fasciculations noted.    Diagnostic Data Reviewed:  BMET    Component Value Date/Time   NA 134 09/19/2023 1242   K 3.8 09/19/2023 1242   CL 95 (L) 09/19/2023 1242   CO2 23 09/19/2023 1242   GLUCOSE 84 09/19/2023 1242   GLUCOSE 91 08/21/2023 1329   BUN 18 09/19/2023 1242   CREATININE 0.58 09/19/2023 1242   CREATININE 0.66 04/06/2016 0931   CALCIUM 9.9 09/19/2023 1242   GFRNONAA >60 06/20/2018 0419   GFRAA >60 06/20/2018 0419   Lab Results  Component Value Date   HGBA1C 5.3 06/29/2022    HGBA1C 4.9 06/15/2018   Lab Results  Component Value Date   INSULIN  32.5 (H) 06/29/2022   Lab Results  Component Value Date   TSH 0.92 08/21/2023   CBC    Component Value Date/Time   WBC 7.1 06/29/2022 1050   WBC 6.7 06/22/2018 0434   RBC 4.46 06/29/2022 1050   RBC 3.64 (L) 06/22/2018 0434   HGB 14.4 06/29/2022 1050   HCT 42.4 06/29/2022 1050   PLT 254 06/29/2022 1050   MCV 95 06/29/2022 1050   MCH 32.3 06/29/2022 1050   MCH 31.3 06/22/2018 0434   MCHC 34.0 06/29/2022 1050   MCHC 31.1 06/22/2018 0434   RDW 12.0 06/29/2022 1050   Iron Studies No results found for: IRON, TIBC, FERRITIN, IRONPCTSAT Lipid Panel     Component Value Date/Time   CHOL 179 06/29/2022 1050   TRIG 129 06/29/2022 1050   HDL 57 06/29/2022 1050   LDLCALC 99 06/29/2022 1050   Hepatic Function Panel     Component Value Date/Time   PROT 6.9 08/21/2023 1329   PROT 7.2 06/29/2022 1050   ALBUMIN 4.2 08/21/2023 1329   ALBUMIN 4.7 06/29/2022 1050   AST 15 08/21/2023 1329   ALT 20 08/21/2023 1329   ALKPHOS 40 08/21/2023 1329   BILITOT 0.3 08/21/2023 1329   BILITOT 0.3 06/29/2022 1050      Component Value Date/Time   TSH 0.92 08/21/2023 1329   Nutritional Lab Results  Component Value Date   VD25OH 42.1 06/29/2022    Medications: Outpatient Encounter Medications as of 03/19/2024  Medication Sig   ARMOUR THYROID  120 MG tablet TAKE 1 TABLET BY MOUTH DAILY BEFORE BREAKFAST   ARMOUR THYROID  15 MG tablet TAKE 1 TABLET BY MOUTH DAILY BEFORE BREAKFAST   buPROPion  (WELLBUTRIN  XL) 300 MG 24 hr tablet Take 300 mg by mouth daily.   EVEKEO 10 MG TABS Take 10 mg by mouth daily as needed (ADHD).    lisinopril  (ZESTRIL ) 40 MG tablet TAKE 1 TABLET BY MOUTH 2 TIMES A DAY   metFORMIN  (GLUCOPHAGE -XR) 500 MG 24 hr tablet Take 1 tablet (500 mg total) by mouth 2 (two) times daily with a meal.   mirabegron ER (MYRBETRIQ) 50 MG TB24 tablet Take 50 mg by mouth daily.   Multiple Vitamins-Minerals  (MULTIVITAMIN) tablet Take 1 tablet by mouth daily.   omeprazole  (PRILOSEC) 20 MG capsule Take 1 capsule (20 mg total) by mouth daily.   ondansetron  (ZOFRAN -ODT) 4 MG disintegrating tablet Take 1 tablet (4 mg total) by mouth every 8 (eight) hours as needed for nausea or vomiting.   triamcinolone  cream (KENALOG ) 0.1 % Apply 1 Application topically daily as needed.   WEGOVY  1.7 MG/0.75ML SOAJ SQ injection Inject 1.7 mg into the skin once a week.  WEGOVY  2.4 MG/0.75ML SOAJ SQ injection Inject 2.4 mg into the skin once a week.   [DISCONTINUED] traZODone (DESYREL) 50 MG tablet Take 50 mg by mouth at bedtime.   No facility-administered encounter medications on file as of 03/19/2024.     Follow-Up   No follow-ups on file.SABRA She was informed of the importance of frequent follow up visits to maximize her success with intensive lifestyle modifications for her multiple health conditions.  Attestation Statement   Reviewed by clinician on day of visit: allergies, medications, problem list, medical history, surgical history, family history, social history, and previous encounter notes.   I have spent 40 minutes in the care of the patient today including: 5 minutes before the visit reviewing and preparing the chart. 28 minutes face-to-face assessing and reviewing listed medical problems as outlined in obesity care plan, providing nutritional and behavioral counseling on topics outlined in the obesity care plan, counseling regarding anti-obesity medication as outlined in obesity care plan, independently interpreting test results and goals of care, as described in assessment and plan, reviewing and discussing biometric information and progress, and ordering diagnostics - see orders 7 minutes after the visit updating chart and documentation of encounter.    Lucas Parker, MD

## 2024-03-19 NOTE — Assessment & Plan Note (Signed)
 Her cardiovascular risk is close to 20% she is currently not on statin therapy and her blood pressure in the office is elevated.  She has family history of coronary artery disease had been on statin in the past but is not taking statins anymore.  I feel that she would benefit from a coronary artery calcium score for the presence of atherosclerotic disease.  She will be paying for test out-of-pocket.

## 2024-03-19 NOTE — Assessment & Plan Note (Signed)
 Patient expresses concern regarding systolic ejection murmur.  On exam she has a soft early systolic ejection murmur heard at the left sternal border without radiation to her carotids.  Carotid upstrokes are normal.  She has regular rate and rhythm.  Because of reports of near syncope bending we will obtain an ultrasound of her heart.  This will also help evaluate for LVH and diastolic heart failure.

## 2024-03-25 ENCOUNTER — Other Ambulatory Visit (INDEPENDENT_AMBULATORY_CARE_PROVIDER_SITE_OTHER): Payer: Self-pay | Admitting: Internal Medicine

## 2024-03-25 DIAGNOSIS — E88819 Insulin resistance, unspecified: Secondary | ICD-10-CM

## 2024-03-27 ENCOUNTER — Other Ambulatory Visit (INDEPENDENT_AMBULATORY_CARE_PROVIDER_SITE_OTHER): Payer: Self-pay | Admitting: Internal Medicine

## 2024-03-27 DIAGNOSIS — E88819 Insulin resistance, unspecified: Secondary | ICD-10-CM

## 2024-03-28 ENCOUNTER — Other Ambulatory Visit (INDEPENDENT_AMBULATORY_CARE_PROVIDER_SITE_OTHER): Payer: Self-pay

## 2024-03-28 ENCOUNTER — Encounter (INDEPENDENT_AMBULATORY_CARE_PROVIDER_SITE_OTHER): Payer: Self-pay | Admitting: Internal Medicine

## 2024-03-28 ENCOUNTER — Other Ambulatory Visit (HOSPITAL_COMMUNITY): Payer: Self-pay

## 2024-03-28 DIAGNOSIS — E88819 Insulin resistance, unspecified: Secondary | ICD-10-CM

## 2024-03-28 MED ORDER — METFORMIN HCL ER 500 MG PO TB24
500.0000 mg | ORAL_TABLET | Freq: Two times a day (BID) | ORAL | 0 refills | Status: DC
  Filled 2024-03-28: qty 180, 90d supply, fill #0

## 2024-03-28 MED ORDER — METFORMIN HCL ER 500 MG PO TB24: 500.0000 mg | ORAL_TABLET | Freq: Two times a day (BID) | ORAL | 0 refills | Status: AC

## 2024-04-01 ENCOUNTER — Other Ambulatory Visit (HOSPITAL_COMMUNITY): Payer: Self-pay

## 2024-04-01 ENCOUNTER — Encounter (INDEPENDENT_AMBULATORY_CARE_PROVIDER_SITE_OTHER): Payer: Self-pay | Admitting: Internal Medicine

## 2024-04-01 MED ORDER — SEMAGLUTIDE-WEIGHT MANAGEMENT 2.4 MG/0.75ML ~~LOC~~ SOAJ
2.4000 mg | SUBCUTANEOUS | 0 refills | Status: DC
  Filled 2024-04-01: qty 3, 28d supply, fill #0

## 2024-04-02 ENCOUNTER — Other Ambulatory Visit (HOSPITAL_COMMUNITY): Payer: Self-pay

## 2024-04-04 ENCOUNTER — Ambulatory Visit (HOSPITAL_COMMUNITY)
Admission: RE | Admit: 2024-04-04 | Discharge: 2024-04-04 | Disposition: A | Discharge status: Home / Self Care | Payer: Self-pay | Source: Ambulatory Visit | Attending: Internal Medicine | Admitting: Internal Medicine

## 2024-04-04 ENCOUNTER — Encounter (HOSPITAL_COMMUNITY): Payer: Self-pay

## 2024-04-04 DIAGNOSIS — R9431 Abnormal electrocardiogram [ECG] [EKG]: Secondary | ICD-10-CM | POA: Insufficient documentation

## 2024-04-04 DIAGNOSIS — Z9189 Other specified personal risk factors, not elsewhere classified: Secondary | ICD-10-CM | POA: Insufficient documentation

## 2024-04-08 ENCOUNTER — Ambulatory Visit (INDEPENDENT_AMBULATORY_CARE_PROVIDER_SITE_OTHER): Payer: Self-pay | Admitting: Internal Medicine

## 2024-04-25 ENCOUNTER — Other Ambulatory Visit (HOSPITAL_COMMUNITY): Payer: Self-pay

## 2024-04-25 ENCOUNTER — Other Ambulatory Visit (HOSPITAL_BASED_OUTPATIENT_CLINIC_OR_DEPARTMENT_OTHER): Payer: Self-pay

## 2024-04-25 MED ORDER — WEGOVY 2.4 MG/0.75ML ~~LOC~~ SOAJ
2.4000 mg | SUBCUTANEOUS | 0 refills | Status: DC
  Filled 2024-04-25: qty 3, 28d supply, fill #0

## 2024-04-30 ENCOUNTER — Encounter (INDEPENDENT_AMBULATORY_CARE_PROVIDER_SITE_OTHER): Payer: Self-pay | Admitting: Internal Medicine

## 2024-04-30 ENCOUNTER — Ambulatory Visit (INDEPENDENT_AMBULATORY_CARE_PROVIDER_SITE_OTHER): Payer: Self-pay | Admitting: Internal Medicine

## 2024-04-30 VITALS — BP 147/87 | HR 87 | Temp 97.8°F | Ht 63.0 in | Wt 160.0 lb

## 2024-04-30 DIAGNOSIS — R931 Abnormal findings on diagnostic imaging of heart and coronary circulation: Secondary | ICD-10-CM | POA: Diagnosis not present

## 2024-04-30 DIAGNOSIS — Z6828 Body mass index (BMI) 28.0-28.9, adult: Secondary | ICD-10-CM

## 2024-04-30 DIAGNOSIS — Z9189 Other specified personal risk factors, not elsewhere classified: Secondary | ICD-10-CM

## 2024-04-30 DIAGNOSIS — I10 Essential (primary) hypertension: Secondary | ICD-10-CM | POA: Diagnosis not present

## 2024-04-30 DIAGNOSIS — E669 Obesity, unspecified: Secondary | ICD-10-CM

## 2024-04-30 MED ORDER — ROSUVASTATIN CALCIUM 5 MG PO TABS: 5.0000 mg | ORAL_TABLET | Freq: Every day | ORAL | 0 refills | Status: AC

## 2024-04-30 NOTE — Assessment & Plan Note (Signed)
 Weight: decrease of 72 lb (31%) over 1 year, 8 months  Start: 08/15/2022 232 lb (105.2 kg)  End: 04/30/2024 160 lb (72.6 kg)  See above obesity treatment plan Patient has had a lot of success and has benefited from GLP-1 treatment medication is being adjusted by workplace program I have recommended decrease this to medication due to the presence of loss of muscle but medication was increased to 2.4 mg weekly.  We will likely take over management of GLP-1 in January as her insurance will change.  I think she would benefit from 2 months of refeeding which will require a reduction in medication to allow for increases in muscle mass.  We also discussed the importance of regular physical activity and increasing volume of exercise she will soon be retiring and will have more time for self-care.  She is looking forward to this

## 2024-04-30 NOTE — Assessment & Plan Note (Signed)
 10-year risk of heart attack or stroke is 17%, considered high risk. CT scan confirms plaque buildup.Coronary calcium score of 245 AU. This was 76th percentile for age-,race-, and sex-matched controls. Discussed benefits of statins in reducing cardiovascular risk and stabilizing plaque. Previous adverse reaction to atorvastatin noted, but open to trying rosuvastatin at a low dose. Discussed potential side effects and nocebo effect of statins. Emphasized importance of blood pressure control and cholesterol management. - Started rosuvastatin 5 mg daily, with plan to increase frequency if tolerated. - Monitor blood pressure twice daily, morning and before bedtime. - Aim for blood pressure goal of less than 130/80 mmHg.

## 2024-04-30 NOTE — Progress Notes (Signed)
 Office: [phone]  /  Fax: [phone]  Weight Summary and Body Composition Analysis (BIA)  Vitals Temp: 97.8 F (36.6 C) BP: (!) 147/87 Pulse Rate: 87 SpO2: 100 %   Anthropometric Measurements Height: 5' 3 (1.6 m) Weight: 160 lb (72.6 kg) BMI (Calculated): 28.35 Weight at Last Visit: 162 lb Weight Lost Since Last Visit: 2 lb Weight Gained Since Last Visit: 0 lb Starting Weight: 224 lb Total Weight Loss (lbs): 64 lb (29 kg) Peak Weight: 230 lb   Body Composition  Body Fat %: 43.6 % Fat Mass (lbs): 69.8 lbs Muscle Mass (lbs): 85.8 lbs Total Body Water (lbs): 63.4 lbs Visceral Fat Rating : 12    RMR: 2477  Today's Visit #: 22  Starting Date: 06/29/22   Subjective   Chief Complaint: Obesity  Interval History Discussed the use of AI scribe software for clinical note transcription with the patient, who gave verbal consent to proceed.  History of Present Illness AVNEET ASHMORE is a 73 year old female with hypertension and insulin  resistance who presents for medical weight management. She is accompanied by Signe Berth, who appears to be a supportive figure in her life.  She is currently taking Wegovy  2.4 mg once a week and metformin  XR 500 mg twice daily. Over the past two years, she has lost 72 pounds, reducing her weight from 232 pounds to 160 pounds, with a current body mass index of 28. Her initial goal was to reach 150 pounds, but she is content with her current weight, feeling healthier and more comfortable in her clothing.  She experiences a persistent, though less severe, loss of interest in food and needs to remind herself to eat. Since increasing her Wegovy  dose to 2.4 mg, she has not experienced nausea or vomiting, despite initial sickness at this dose. She is concerned about muscle loss due to her weight loss and is trying to maintain her protein intake to mitigate this.  She has a history of high blood pressure. She has previously experienced adverse  effects from statins, specifically Lipitor, which caused joint pain and fatigue, leading her to discontinue its use. She is willing to try a different statin at a low dose due to her concerns about cardiovascular risk.  She is planning to retire soon, with her last full day of work being May 27, 2024. She is looking forward to having more time to focus on her health, including going to the gym and working on muscle mass with light weights and walking. Her concerns about her own cardiovascular health have been influenced by witnessing Jim's recovery from a stroke.     Challenges affecting patient progress: work schedule, low volume of physical activity at present , and moderate to high levels of stress.    Pharmacotherapy for weight management: She is currently taking Wegovy  with adequate clinical response , without side effects., and on maximum dose managed by workplace program..   Assessment and Plan   Treatment Plan For Obesity:  Recommended Dietary Goals  Vernadine is currently in the action stage of change. As such, her goal is to continue weight management plan. She has agreed to: continue current plan  Behavioral Health and Counseling  We discussed the following behavioral modification strategies today: increasing lean protein intake to established goals and avoiding skipping meals.  Additional education and resources provided today: Handout on traveling and holiday eating strategies  Recommended Physical Activity Goals  Breanah has been advised to work up to 150 minutes of moderate  intensity aerobic activity a week and strengthening exercises 2-3 times per week for cardiovascular health, weight loss maintenance and preservation of muscle mass.  She has agreed to :  Think about enjoyable ways to increase daily physical activity and overcoming barriers to exercise, Increase physical activity in their day and reduce sedentary time (increase NEAT)., and Combine aerobic and  strengthening exercises for efficiency and improved cardiometabolic health.  Medical Interventions and Pharmacotherapy  We discussed various medication options to help Natilie with her weight loss efforts and we both agreed to : Recommend decreasing Wegovy  in the future to allow for a refeeding phase as she has lost muscle which puts her at risk for sarcopenia.  Medication is being adjusted by workplace program  Associated Conditions Impacted by Obesity Treatment  Assessment & Plan At increased risk for cardiovascular disease Agatston coronary artery calcium score between 200 and 399 10-year risk of heart attack or stroke is 17%, considered high risk. CT scan confirms plaque buildup.Coronary calcium score of 245 AU. This was 76th percentile for age-,race-, and sex-matched controls. Discussed benefits of statins in reducing cardiovascular risk and stabilizing plaque. Previous adverse reaction to atorvastatin noted, but open to trying rosuvastatin at a low dose. Discussed potential side effects and nocebo effect of statins. Emphasized importance of blood pressure control and cholesterol management. - Started rosuvastatin 5 mg daily, with plan to increase frequency if tolerated. - Monitor blood pressure twice daily, morning and before bedtime. - Aim for blood pressure goal of less than 130/80 mmHg. Generalized obesity -with a starting BMI of 40 BMI 28.0-28.9,adult Weight: decrease of 72 lb (31%) over 1 year, 8 months  Start: 08/15/2022 232 lb (105.2 kg)  End: 04/30/2024 160 lb (72.6 kg)  See above obesity treatment plan Patient has had a lot of success and has benefited from GLP-1 treatment medication is being adjusted by workplace program I have recommended decrease this to medication due to the presence of loss of muscle but medication was increased to 2.4 mg weekly.  We will likely take over management of GLP-1 in January as her insurance will change.  I think she would benefit from 2 months of  refeeding which will require a reduction in medication to allow for increases in muscle mass.  We also discussed the importance of regular physical activity and increasing volume of exercise she will soon be retiring and will have more time for self-care.  She is looking forward to this Essential hypertension Blood pressure control is crucial due to cardiovascular risk.  She is on lisinopril  but is also on 3 medications that may elevate blood pressure including Wellbutrin , Evekeo and Myrbetriq.  She has stopped using Myrbetriq recently.  She is also working on reducing the Evekeo. - Taper off stimulant medication gradually under the guidance of prescribing physician - Monitor blood pressure twice daily, morning and before bedtime.  If blood pressures above 140/90 recommend treatment intensification until blood pressure starts to normalize. - Aim for blood pressure goal of less than 130/80 mmHg.   The 10-year ASCVD risk score (Arnett DK, et al., 2019) is: 17.5%  Assessment and Plan       Objective   Physical Exam:  Blood pressure (!) 147/87, pulse 87, temperature 97.8 F (36.6 C), height 5' 3 (1.6 m), weight 160 lb (72.6 kg), SpO2 100%. Body mass index is 28.34 kg/m.  General: She is overweight, cooperative, alert, well developed, and in no acute distress. PSYCH: Has normal mood, affect and thought process.  HEENT: EOMI, sclerae are anicteric. Lungs: Normal breathing effort, no conversational dyspnea. Extremities: No edema.  Neurologic: No gross sensory or motor deficits. No tremors or fasciculations noted.    Diagnostic Data Reviewed:  BMET    Component Value Date/Time   NA 134 09/19/2023 1242   K 3.8 09/19/2023 1242   CL 95 (L) 09/19/2023 1242   CO2 23 09/19/2023 1242   GLUCOSE 84 09/19/2023 1242   GLUCOSE 91 08/21/2023 1329   BUN 18 09/19/2023 1242   CREATININE 0.58 09/19/2023 1242   CREATININE 0.66 04/06/2016 0931   CALCIUM 9.9 09/19/2023 1242   GFRNONAA >60  06/20/2018 0419   GFRAA >60 06/20/2018 0419   Lab Results  Component Value Date   HGBA1C 5.3 06/29/2022   HGBA1C 4.9 06/15/2018   Lab Results  Component Value Date   INSULIN  32.5 (H) 06/29/2022   Lab Results  Component Value Date   TSH 0.92 08/21/2023   CBC    Component Value Date/Time   WBC 7.1 06/29/2022 1050   WBC 6.7 06/22/2018 0434   RBC 4.46 06/29/2022 1050   RBC 3.64 (L) 06/22/2018 0434   HGB 14.4 06/29/2022 1050   HCT 42.4 06/29/2022 1050   PLT 254 06/29/2022 1050   MCV 95 06/29/2022 1050   MCH 32.3 06/29/2022 1050   MCH 31.3 06/22/2018 0434   MCHC 34.0 06/29/2022 1050   MCHC 31.1 06/22/2018 0434   RDW 12.0 06/29/2022 1050   Iron Studies No results found for: IRON, TIBC, FERRITIN, IRONPCTSAT Lipid Panel     Component Value Date/Time   CHOL 179 06/29/2022 1050   TRIG 129 06/29/2022 1050   HDL 57 06/29/2022 1050   LDLCALC 99 06/29/2022 1050   Hepatic Function Panel     Component Value Date/Time   PROT 6.9 08/21/2023 1329   PROT 7.2 06/29/2022 1050   ALBUMIN 4.2 08/21/2023 1329   ALBUMIN 4.7 06/29/2022 1050   AST 15 08/21/2023 1329   ALT 20 08/21/2023 1329   ALKPHOS 40 08/21/2023 1329   BILITOT 0.3 08/21/2023 1329   BILITOT 0.3 06/29/2022 1050      Component Value Date/Time   TSH 0.92 08/21/2023 1329   Nutritional Lab Results  Component Value Date   VD25OH 42.1 06/29/2022    Medications: Outpatient Encounter Medications as of 04/30/2024  Medication Sig   ARMOUR THYROID  120 MG tablet TAKE 1 TABLET BY MOUTH DAILY BEFORE BREAKFAST   ARMOUR THYROID  15 MG tablet TAKE 1 TABLET BY MOUTH DAILY BEFORE BREAKFAST   buPROPion  (WELLBUTRIN  XL) 300 MG 24 hr tablet Take 300 mg by mouth daily.   EVEKEO 10 MG TABS Take 10 mg by mouth daily as needed (ADHD).    lisinopril  (ZESTRIL ) 40 MG tablet TAKE 1 TABLET BY MOUTH 2 TIMES A DAY   metFORMIN  (GLUCOPHAGE -XR) 500 MG 24 hr tablet Take 1 tablet (500 mg total) by mouth 2 (two) times daily with a meal.    mirabegron ER (MYRBETRIQ) 50 MG TB24 tablet Take 50 mg by mouth daily.   Multiple Vitamins-Minerals (MULTIVITAMIN) tablet Take 1 tablet by mouth daily.   omeprazole  (PRILOSEC) 20 MG capsule Take 1 capsule (20 mg total) by mouth daily.   ondansetron  (ZOFRAN -ODT) 4 MG disintegrating tablet Take 1 tablet (4 mg total) by mouth every 8 (eight) hours as needed for nausea or vomiting.   triamcinolone  cream (KENALOG ) 0.1 % Apply 1 Application topically daily as needed.   WEGOVY  2.4 MG/0.75ML SOAJ SQ injection Inject 2.4 mg into the  skin once a week.   No facility-administered encounter medications on file as of 04/30/2024.     Follow-Up   No follow-ups on file.SABRA She was informed of the importance of frequent follow up visits to maximize her success with intensive lifestyle modifications for her multiple health conditions.  Attestation Statement   Reviewed by clinician on day of visit: allergies, medications, problem list, medical history, surgical history, family history, social history, and previous encounter notes.   I have spent 43 minutes in the care of the patient today including: 2 minutes before the visit reviewing and preparing the chart. 34 minutes face-to-face assessing and reviewing listed medical problems as outlined in obesity care plan, providing nutritional and behavioral counseling on topics outlined in the obesity care plan, counseling regarding anti-obesity medication as outlined in obesity care plan, independently interpreting test results and goals of care, as described in assessment and plan, and reviewing and discussing biometric information and progress 9 minutes after the visit updating chart and documentation of encounter.    Lucas Parker, MD

## 2024-04-30 NOTE — Assessment & Plan Note (Signed)
 Blood pressure control is crucial due to cardiovascular risk.  She is on lisinopril  but is also on 3 medications that may elevate blood pressure including Wellbutrin , Evekeo and Myrbetriq.  She has stopped using Myrbetriq recently.  She is also working on reducing the Evekeo. - Taper off stimulant medication gradually under the guidance of prescribing physician - Monitor blood pressure twice daily, morning and before bedtime.  If blood pressures above 140/90 recommend treatment intensification until blood pressure starts to normalize. - Aim for blood pressure goal of less than 130/80 mmHg.

## 2024-05-27 ENCOUNTER — Other Ambulatory Visit (HOSPITAL_COMMUNITY): Payer: Self-pay

## 2024-05-27 ENCOUNTER — Other Ambulatory Visit (INDEPENDENT_AMBULATORY_CARE_PROVIDER_SITE_OTHER): Payer: Self-pay | Admitting: Internal Medicine

## 2024-05-27 ENCOUNTER — Other Ambulatory Visit: Payer: Self-pay | Admitting: Family Medicine

## 2024-05-27 ENCOUNTER — Ambulatory Visit (INDEPENDENT_AMBULATORY_CARE_PROVIDER_SITE_OTHER): Admitting: Internal Medicine

## 2024-05-27 DIAGNOSIS — Z9189 Other specified personal risk factors, not elsewhere classified: Secondary | ICD-10-CM

## 2024-05-27 DIAGNOSIS — E039 Hypothyroidism, unspecified: Secondary | ICD-10-CM

## 2024-05-27 DIAGNOSIS — I10 Essential (primary) hypertension: Secondary | ICD-10-CM

## 2024-05-27 MED ORDER — WEGOVY 2.4 MG/0.75ML ~~LOC~~ SOAJ
2.4000 mg | SUBCUTANEOUS | 0 refills | Status: DC
  Filled 2024-05-27: qty 3, 28d supply, fill #0

## 2024-05-30 ENCOUNTER — Ambulatory Visit (INDEPENDENT_AMBULATORY_CARE_PROVIDER_SITE_OTHER): Admitting: Internal Medicine

## 2024-05-30 VITALS — BP 120/80 | HR 98 | Temp 97.6°F | Ht 63.0 in | Wt 155.0 lb

## 2024-05-30 DIAGNOSIS — Z9189 Other specified personal risk factors, not elsewhere classified: Secondary | ICD-10-CM | POA: Diagnosis not present

## 2024-05-30 DIAGNOSIS — Z6827 Body mass index (BMI) 27.0-27.9, adult: Secondary | ICD-10-CM

## 2024-05-30 DIAGNOSIS — R638 Other symptoms and signs concerning food and fluid intake: Secondary | ICD-10-CM | POA: Diagnosis not present

## 2024-05-30 DIAGNOSIS — E669 Obesity, unspecified: Secondary | ICD-10-CM | POA: Diagnosis not present

## 2024-05-30 NOTE — Assessment & Plan Note (Signed)
 Weight: decrease of 71 lb (31.4%) over 1 year, 4 months  Start: 01/04/2023 226 lb (102.5 kg)  End: 05/30/2024 155 lb (70.3 kg)  She has a gastrointestinal and neurological phenotype.  She continues to lose weight in the maintenance phase, she has not been able to reduce her Wegovy  which is currently being prescribed by employer based program.  She will be transitioning to us  for medication management and we will begin at down titration protocol as I do not recommend further weight loss.  She has also lost muscle mass needs to work on building this back up by increasing strengthening exercises as well as calories and protein intake.  Handgrip strength today was normal.The plan is to reduce the Wegovy  dose to 1.7 mg to allow for increased caloric intake while maintaining weight. She is aware of the potential for increased hunger signals if the dose is reduced too quickly, but she has not experienced increased hunger despite missing a dose for a week and a half. She is preparing to transition to Medicare and will need to go through prior authorization for Wegovy . - Reduce Wegovy  dose to 1.7 mg. - Monitor weight and appetite. - Plan for insurance transition and prior authorization for Wegovy . - Maintain and isolate caloric state for weight loss maintenance

## 2024-05-30 NOTE — Progress Notes (Signed)
 Office: [phone]  /  Fax: [phone]  Weight Summary and Body Composition Analysis (BIA)  Vitals Temp: 97.6 F (36.4 C) BP: 120/80 Pulse Rate: 98 SpO2: 100 %   Anthropometric Measurements Height: 5' 3 (1.6 m) Weight: 155 lb (70.3 kg) BMI (Calculated): 27.46 Weight at Last Visit: 160 lb Weight Lost Since Last Visit: 5 lb Weight Gained Since Last Visit: 0 lb Starting Weight: 224 lb Total Weight Loss (lbs): 69 lb (31.3 kg) Peak Weight: 230 lb   Body Composition  Body Fat %: 42.3 % Fat Mass (lbs): 65.8 lbs Muscle Mass (lbs): 85 lbs Total Body Water (lbs): 62.6 lbs Visceral Fat Rating : 11    RMR: 2477  Today's Visit #: 23  Starting Date: 06/29/22   Subjective   Chief Complaint: Obesity  Interval History  Discussed the use of AI scribe software for clinical note transcription with the patient, who gave verbal consent to proceed.  History of Present Illness Shannon Reyes is a 73 year old female who presents for medical weight management.  Gastrointestinal symptoms and recent illness - Recent episode of stomach virus resulting in diarrhea, stomach upset, and temporary loss of appetite - Experienced a five-pound weight loss during illness - Did not administer Wegovy  injection during illness - Husband also experienced mild symptoms of the virus - Currently ensuring adequate calorie intake despite recent illness  weight management - On 1200-calorie nutrition plan with good adherence - In the maintenance phase but still losing weight - No exercise reported - Currently on Wegovy  2.4 mg for weight management provided through workplace program - Wegovy  has been effective for weight loss - Adverse effects from Wegovy  include constipation and fatigue  Lifestyle and psychosocial factors - Transitioning into retirement with significant reduction in stress levels - Active membership at [employer] with husband  Medication use - Currently taking a statin at  a very small dose  Insurance status - Preparing for transition to [employer] and a supplemental insurance plan     Challenges affecting patient progress: low volume of physical activity at present .    Pharmacotherapy for weight management: She is currently taking Wegovy  with adequate clinical response  and without side effects..   Assessment and Plan   Treatment Plan For Obesity:  Recommended Dietary Goals  Shannon Reyes is currently in the action stage of change. As such, her goal is to continue weight management plan. She has agreed to: continue current plan and currently in the maintenance phase  Behavioral Health and Counseling  We discussed the following behavioral modification strategies today: Advised to maintain and isolate caloric state to avoid further weight loss.  Additional education and resources provided today: Handout on traveling and holiday eating strategies  Recommended Physical Activity Goals  Shannon Reyes has been advised to work up to 150 minutes of moderate intensity aerobic activity a week and strengthening exercises 2-3 times per week for cardiovascular health, weight loss maintenance and preservation of muscle mass.  She has agreed to :  Think about enjoyable ways to increase daily physical activity and overcoming barriers to exercise and Increase physical activity in their day and reduce sedentary time (increase NEAT).  Medical Interventions and Pharmacotherapy  We discussed various medication options to help Shannon Reyes with her weight loss efforts and we both agreed to : She is currently receiving Wegovy  through an employer based program and remains at the maximum dose despite ongoing weight loss and also muscle loss.  I would like for her to maintain and  therefore have recommended that her Wegovy  be reduced we will have to do this when she is done with this program in January.  She will also be switching to private insurance so may need to go through that process to  continue medication.  Associated Conditions Impacted by Obesity Treatment  Assessment & Plan Generalized obesity -starting BMI of 41 Abnormal food appetite Weight: decrease of 71 lb (31.4%) over 1 year, 4 months  Start: 01/04/2023 226 lb (102.5 kg)  End: 05/30/2024 155 lb (70.3 kg)  She has a gastrointestinal and neurological phenotype.  She continues to lose weight in the maintenance phase, she has not been able to reduce her Wegovy  which is currently being prescribed by employer based program.  She will be transitioning to us  for medication management and we will begin at down titration protocol as I do not recommend further weight loss.  She has also lost muscle mass needs to work on building this back up by increasing strengthening exercises as well as calories and protein intake.  Handgrip strength today was normal.The plan is to reduce the Wegovy  dose to 1.7 mg to allow for increased caloric intake while maintaining weight. She is aware of the potential for increased hunger signals if the dose is reduced too quickly, but she has not experienced increased hunger despite missing a dose for a week and a half. She is preparing to transition to Medicare and will need to go through prior authorization for Wegovy . - Reduce Wegovy  dose to 1.7 mg. - Monitor weight and appetite. - Plan for insurance transition and prior authorization for Wegovy . - Maintain and isolate caloric state for weight loss maintenance  At increased risk for cardiovascular disease Her blood pressure is now is better controlled as she has retired and is no longer under stress.  She is now on statin therapy and is tolerating medication well without any adverse effects.  Continue statin therapy          Objective   Physical Exam:  Blood pressure 120/80, pulse 98, temperature 97.6 F (36.4 C), height 5' 3 (1.6 m), weight 155 lb (70.3 kg), SpO2 100%. Body mass index is 27.46 kg/m.  General: She is overweight,  cooperative, alert, well developed, and in no acute distress. PSYCH: Has normal mood, affect and thought process.   HEENT: EOMI, sclerae are anicteric. Lungs: Normal breathing effort, no conversational dyspnea. Extremities: No edema.  Neurologic: No gross sensory or motor deficits. No tremors or fasciculations noted.    Diagnostic Data Reviewed:  BMET    Component Value Date/Time   NA 134 09/19/2023 1242   K 3.8 09/19/2023 1242   CL 95 (L) 09/19/2023 1242   CO2 23 09/19/2023 1242   GLUCOSE 84 09/19/2023 1242   GLUCOSE 91 08/21/2023 1329   BUN 18 09/19/2023 1242   CREATININE 0.58 09/19/2023 1242   CREATININE 0.66 04/06/2016 0931   CALCIUM  9.9 09/19/2023 1242   GFRNONAA >60 06/20/2018 0419   GFRAA >60 06/20/2018 0419   Lab Results  Component Value Date   HGBA1C 5.3 06/29/2022   HGBA1C 4.9 06/15/2018   Lab Results  Component Value Date   INSULIN  32.5 (H) 06/29/2022   Lab Results  Component Value Date   TSH 0.92 08/21/2023   CBC    Component Value Date/Time   WBC 7.1 06/29/2022 1050   WBC 6.7 06/22/2018 0434   RBC 4.46 06/29/2022 1050   RBC 3.64 (L) 06/22/2018 0434   HGB 14.4 06/29/2022 1050  HCT 42.4 06/29/2022 1050   PLT 254 06/29/2022 1050   MCV 95 06/29/2022 1050   MCH 32.3 06/29/2022 1050   MCH 31.3 06/22/2018 0434   MCHC 34.0 06/29/2022 1050   MCHC 31.1 06/22/2018 0434   RDW 12.0 06/29/2022 1050   Iron Studies No results found for: IRON, TIBC, FERRITIN, IRONPCTSAT Lipid Panel     Component Value Date/Time   CHOL 179 06/29/2022 1050   TRIG 129 06/29/2022 1050   HDL 57 06/29/2022 1050   LDLCALC 99 06/29/2022 1050   Hepatic Function Panel     Component Value Date/Time   PROT 6.9 08/21/2023 1329   PROT 7.2 06/29/2022 1050   ALBUMIN 4.2 08/21/2023 1329   ALBUMIN 4.7 06/29/2022 1050   AST 15 08/21/2023 1329   ALT 20 08/21/2023 1329   ALKPHOS 40 08/21/2023 1329   BILITOT 0.3 08/21/2023 1329   BILITOT 0.3 06/29/2022 1050      Component  Value Date/Time   TSH 0.92 08/21/2023 1329   Nutritional Lab Results  Component Value Date   VD25OH 42.1 06/29/2022    Medications: Outpatient Encounter Medications as of 05/30/2024  Medication Sig   ARMOUR THYROID  120 MG tablet TAKE 1 TABLET BY MOUTH DAILY BEFORE BREAKFAST   ARMOUR THYROID  15 MG tablet TAKE 1 TABLET BY MOUTH DAILY BEFORE BREAKFAST   buPROPion  (WELLBUTRIN  XL) 300 MG 24 hr tablet Take 300 mg by mouth daily.   EVEKEO 10 MG TABS Take 10 mg by mouth daily as needed (ADHD).    lisinopril  (ZESTRIL ) 40 MG tablet TAKE 1 TABLET BY MOUTH 2 TIMES A DAY   metFORMIN  (GLUCOPHAGE -XR) 500 MG 24 hr tablet Take 1 tablet (500 mg total) by mouth 2 (two) times daily with a meal.   mirabegron ER (MYRBETRIQ) 50 MG TB24 tablet Take 50 mg by mouth daily.   Multiple Vitamins-Minerals (MULTIVITAMIN) tablet Take 1 tablet by mouth daily.   omeprazole  (PRILOSEC) 20 MG capsule Take 1 capsule (20 mg total) by mouth daily.   ondansetron  (ZOFRAN -ODT) 4 MG disintegrating tablet Take 1 tablet (4 mg total) by mouth every 8 (eight) hours as needed for nausea or vomiting.   rosuvastatin  (CRESTOR ) 5 MG tablet Take 1 tablet (5 mg total) by mouth daily.   triamcinolone  cream (KENALOG ) 0.1 % Apply 1 Application topically daily as needed.   WEGOVY  2.4 MG/0.75ML SOAJ SQ injection Inject 2.4 mg into the skin once a week.   No facility-administered encounter medications on file as of 05/30/2024.     Follow-Up   No follow-ups on file.SABRA She was informed of the importance of frequent follow up visits to maximize her success with intensive lifestyle modifications for her multiple health conditions.  Attestation Statement   Reviewed by clinician on day of visit: allergies, medications, problem list, medical history, surgical history, family history, social history, and previous encounter notes.     Lucas Parker, MD

## 2024-05-30 NOTE — Assessment & Plan Note (Signed)
 Her blood pressure is now is better controlled as she has retired and is no longer under stress.  She is now on statin therapy and is tolerating medication well without any adverse effects.  Continue statin therapy

## 2024-07-01 ENCOUNTER — Ambulatory Visit: Payer: Self-pay

## 2024-07-01 ENCOUNTER — Encounter: Payer: Self-pay | Admitting: Family

## 2024-07-01 ENCOUNTER — Ambulatory Visit (INDEPENDENT_AMBULATORY_CARE_PROVIDER_SITE_OTHER): Admitting: Family

## 2024-07-01 ENCOUNTER — Ambulatory Visit

## 2024-07-01 VITALS — BP 132/76 | HR 90 | Ht 63.0 in | Wt 149.2 lb

## 2024-07-01 DIAGNOSIS — J069 Acute upper respiratory infection, unspecified: Secondary | ICD-10-CM

## 2024-07-01 DIAGNOSIS — J209 Acute bronchitis, unspecified: Secondary | ICD-10-CM

## 2024-07-01 DIAGNOSIS — R051 Acute cough: Secondary | ICD-10-CM | POA: Diagnosis not present

## 2024-07-01 MED ORDER — PREDNISONE 20 MG PO TABS: 40.0000 mg | ORAL_TABLET | Freq: Every day | ORAL | 0 refills | Status: AC

## 2024-07-01 MED ORDER — DOXYCYCLINE HYCLATE 100 MG PO TABS: 100.0000 mg | ORAL_TABLET | Freq: Two times a day (BID) | ORAL | 0 refills | Status: AC

## 2024-07-01 NOTE — Progress Notes (Signed)
 "  Acute Office Visit  Subjective:     Patient ID: Shannon Reyes, female    DOB: 1950-07-02, 74 y.o.   MRN: [id number]  Chief Complaint  Patient presents with   Cough    Body ache congestion-nasal and chest, cough, chills for over a week     HPI Patient is in today with complaints of cough, congestion, fatigue, body aches x 1 week without relief.  Has been taking over-the-counter Mucinex cold and flu and Tylenol  that helps some but do not get rid of the symptoms.  Husband was sick as well but has recovered.  Denies any fever.  Review of Systems  Constitutional:  Positive for chills and malaise/fatigue. Negative for fever.  HENT:  Positive for congestion.   Respiratory:  Positive for cough and wheezing.   Cardiovascular: Negative.   Musculoskeletal: Negative.   Skin: Negative.   Neurological: Negative.   Endo/Heme/Allergies: Negative.   Psychiatric/Behavioral: Negative.        Objective:    BP 132/76 (BP Location: Left Arm, Patient Position: Sitting, Cuff Size: Normal)   Pulse 90   Ht 5' 3 (1.6 m)   Wt 149 lb 3.2 oz (67.7 kg)   SpO2 97%   BMI 26.43 kg/m    Physical Exam Vitals and nursing note reviewed.  Constitutional:      Appearance: Normal appearance. She is normal weight.  HENT:     Head: Normocephalic.     Nose: Congestion present.     Mouth/Throat:     Mouth: Mucous membranes are moist.     Pharynx: Posterior oropharyngeal erythema present. No oropharyngeal exudate.  Cardiovascular:     Rate and Rhythm: Normal rate and regular rhythm.  Pulmonary:     Effort: Pulmonary effort is normal.     Comments: Mild wheezing on the left Musculoskeletal:        General: Normal range of motion.     Cervical back: Normal range of motion and neck supple.  Skin:    General: Skin is warm and dry.  Neurological:     General: No focal deficit present.     Mental Status: She is alert and oriented to person, place, and time. Mental status is at baseline.  Psychiatric:         Mood and Affect: Mood normal.    No results found for any visits on 07/01/24.      Assessment & Plan:   Problem List Items Addressed This Visit   None Visit Diagnoses       Acute bronchitis, unspecified organism    -  Primary   Relevant Orders   DG Chest 2 View     Acute cough       Relevant Orders   DG Chest 2 View     Upper respiratory tract infection, unspecified type           Meds ordered this encounter  Medications   doxycycline  (VIBRA -TABS) 100 MG tablet    Sig: Take 1 tablet (100 mg total) by mouth 2 (two) times daily.    Dispense:  20 tablet    Refill:  0   predniSONE  (DELTASONE ) 20 MG tablet    Sig: Take 2 tablets (40 mg total) by mouth daily with breakfast.    Dispense:  10 tablet    Refill:  0   Call the office with any questions or concerns.  Recheck as scheduled and sooner as needed. No follow-ups on file.  Daris Harkins B Angelyse Heslin,  FNP   "

## 2024-07-01 NOTE — Telephone Encounter (Signed)
 Noted.

## 2024-07-01 NOTE — Telephone Encounter (Signed)
 FYI Only or Action Required?: FYI only for provider: appointment scheduled on 1/19.  Patient was last seen in primary care on 05/30/2024 by Francyne Romano, MD.  Called Nurse Triage reporting Influenza.  Symptoms began a week ago.  Interventions attempted: OTC medications: Mucinex.  Symptoms are: unchanged.  Triage Disposition: See Physician Within 24 Hours  Patient/caregiver understands and will follow disposition?: Yes    Pt with severe cough, fatigue, weakness, congestion, headache, earache and central sore aching chest pain that's worse with coughing x1 week. Believes she has the flu. Husband has similar symptoms. No fever. No SOB.Scheduled appt with different provider at home office today d/t no PCP availability within timeframe. Advised UC or ED for worsening symptoms.      Message from Howard H sent at 07/01/2024  1:20 PM EST  Reason for Triage: Sick with flu, congestion, severe coughing, chest pain, weakness and fatigue for a week   Reason for Disposition  Earache  Answer Assessment - Initial Assessment Questions 1. SYMPTOMS: What is your main symptom or concern? (e.g., cough, fever, shortness of breath, muscle aches)     Cough, fatigue, weakness, general body aches, congestion and central chest pain worse with coughing   2. ONSET: When did the symptoms start?      1 week ago  3. COUGH: Do you have a cough? If Yes, ask: How bad is the cough?       Moderate to severe  4. FEVER: Do you have a fever? If Yes, ask: What is your temperature, how was it measured, and when did it start?     Denies  5. BREATHING DIFFICULTY: Are you having any difficulty breathing? (e.g., normal; shortness of breath, wheezing, unable to speak)      None  6. BETTER-SAME-WORSE: Are you getting better, staying the same or getting worse compared to yesterday?  If getting worse, ask, In what way?     Slightly better but stalled.  7. OTHER SYMPTOMS: Do you have any  other symptoms?  (e.g., chills, fatigue, headache, loss of smell or taste, muscle pain, sore throat)     Headache, sore throat  8. INFLUENZA EXPOSURE: Was there any known exposure to influenza (flu) before the symptoms began?      Denies  9. INFLUENZA SUSPECTED: Why do you think you have influenza? (e.g., positive flu self-test at home, symptoms after exposure).     Flu like symptoms, has not taken a home test or been dx  10. INFLUENZA VACCINE: Have you had the flu vaccine? If Yes, ask: When did you last get it?       Denies  11. HIGH RISK FOR COMPLICATIONS: Do you have any chronic medical problems? (e.g., asthma, heart or lung disease, obesity, weak immune system)       Denies  Protocols used: Influenza (Flu) Suspected-A-AH

## 2024-07-02 ENCOUNTER — Ambulatory Visit (INDEPENDENT_AMBULATORY_CARE_PROVIDER_SITE_OTHER): Admitting: Internal Medicine

## 2024-07-04 ENCOUNTER — Other Ambulatory Visit: Payer: Self-pay

## 2024-07-04 ENCOUNTER — Encounter (HOSPITAL_BASED_OUTPATIENT_CLINIC_OR_DEPARTMENT_OTHER): Payer: Self-pay

## 2024-07-04 ENCOUNTER — Ambulatory Visit: Payer: Self-pay | Admitting: Family

## 2024-07-04 ENCOUNTER — Emergency Department (HOSPITAL_BASED_OUTPATIENT_CLINIC_OR_DEPARTMENT_OTHER)

## 2024-07-04 ENCOUNTER — Emergency Department (HOSPITAL_BASED_OUTPATIENT_CLINIC_OR_DEPARTMENT_OTHER)
Admission: EM | Admit: 2024-07-04 | Discharge: 2024-07-04 | Disposition: A | Discharge status: Home / Self Care | Attending: Emergency Medicine | Admitting: Emergency Medicine

## 2024-07-04 ENCOUNTER — Other Ambulatory Visit (HOSPITAL_BASED_OUTPATIENT_CLINIC_OR_DEPARTMENT_OTHER): Payer: Self-pay

## 2024-07-04 DIAGNOSIS — Z79899 Other long term (current) drug therapy: Secondary | ICD-10-CM | POA: Diagnosis not present

## 2024-07-04 DIAGNOSIS — M546 Pain in thoracic spine: Secondary | ICD-10-CM | POA: Diagnosis not present

## 2024-07-04 DIAGNOSIS — I10 Essential (primary) hypertension: Secondary | ICD-10-CM | POA: Diagnosis not present

## 2024-07-04 DIAGNOSIS — J40 Bronchitis, not specified as acute or chronic: Secondary | ICD-10-CM | POA: Diagnosis not present

## 2024-07-04 DIAGNOSIS — Z7984 Long term (current) use of oral hypoglycemic drugs: Secondary | ICD-10-CM | POA: Diagnosis not present

## 2024-07-04 DIAGNOSIS — E876 Hypokalemia: Secondary | ICD-10-CM | POA: Insufficient documentation

## 2024-07-04 DIAGNOSIS — E878 Other disorders of electrolyte and fluid balance, not elsewhere classified: Secondary | ICD-10-CM | POA: Insufficient documentation

## 2024-07-04 DIAGNOSIS — R0602 Shortness of breath: Secondary | ICD-10-CM | POA: Diagnosis present

## 2024-07-04 DIAGNOSIS — E039 Hypothyroidism, unspecified: Secondary | ICD-10-CM | POA: Diagnosis not present

## 2024-07-04 DIAGNOSIS — E871 Hypo-osmolality and hyponatremia: Secondary | ICD-10-CM | POA: Diagnosis not present

## 2024-07-04 DIAGNOSIS — R059 Cough, unspecified: Secondary | ICD-10-CM | POA: Diagnosis present

## 2024-07-04 LAB — BASIC METABOLIC PANEL WITH GFR
Anion gap: 14 (ref 5–15)
BUN: 18 mg/dL (ref 8–23)
CO2: 25 mmol/L (ref 22–32)
Calcium: 9.8 mg/dL (ref 8.9–10.3)
Chloride: 94 mmol/L — ABNORMAL LOW (ref 98–111)
Creatinine, Ser: 0.52 mg/dL (ref 0.44–1.00)
GFR, Estimated: >60 mL/min
Glucose, Bld: 91 mg/dL (ref 70–99)
Potassium: 2.9 mmol/L — ABNORMAL LOW (ref 3.5–5.1)
Sodium: 132 mmol/L — ABNORMAL LOW (ref 135–145)

## 2024-07-04 LAB — RESP PANEL BY RT-PCR (RSV, FLU A&B, COVID)  RVPGX2
Influenza A by PCR: NEGATIVE
Influenza B by PCR: NEGATIVE
Resp Syncytial Virus by PCR: NEGATIVE
SARS Coronavirus 2 by RT PCR: NEGATIVE

## 2024-07-04 LAB — TROPONIN T, HIGH SENSITIVITY: Troponin T High Sensitivity: 8 ng/L (ref 0–19)

## 2024-07-04 LAB — CBC
HCT: 37 % (ref 36.0–46.0)
Hemoglobin: 13.1 g/dL (ref 12.0–15.0)
MCH: 30.5 pg (ref 26.0–34.0)
MCHC: 35.4 g/dL (ref 30.0–36.0)
MCV: 86 fL (ref 80.0–100.0)
Platelets: 250 K/uL (ref 150–400)
RBC: 4.3 MIL/uL (ref 3.87–5.11)
RDW: 13.1 % (ref 11.5–15.5)
WBC: 6 K/uL (ref 4.0–10.5)
nRBC: 0 % (ref 0.0–0.2)

## 2024-07-04 LAB — PRO BRAIN NATRIURETIC PEPTIDE: Pro Brain Natriuretic Peptide: 95.9 pg/mL

## 2024-07-04 LAB — MAGNESIUM: Magnesium: 1.6 mg/dL — ABNORMAL LOW (ref 1.7–2.4)

## 2024-07-04 MED ORDER — POTASSIUM CHLORIDE CRYS ER 10 MEQ PO TBCR
10.0000 meq | EXTENDED_RELEASE_TABLET | Freq: Every day | ORAL | 0 refills | Status: AC
  Filled 2024-07-04: qty 7, 7d supply, fill #0

## 2024-07-04 MED ORDER — SODIUM CHLORIDE 0.9 % IV BOLUS
1000.0000 mL | Freq: Once | INTRAVENOUS | Status: AC
  Administered 2024-07-04: 1000 mL via INTRAVENOUS

## 2024-07-04 MED ORDER — POTASSIUM CHLORIDE CRYS ER 20 MEQ PO TBCR
40.0000 meq | EXTENDED_RELEASE_TABLET | Freq: Once | ORAL | Status: AC
  Administered 2024-07-04: 40 meq via ORAL
  Filled 2024-07-04: qty 2

## 2024-07-04 NOTE — ED Notes (Signed)
 Reviewed discharge instructions, follow up and medications. Pt states understanding No distress noted. Ambulatory at discharge with husband

## 2024-07-04 NOTE — ED Triage Notes (Signed)
 Reports cough, congestion, fatigue, body aches for 2 week. Seen by PCP, given abx and prednisone  for pneumonia. Has taken 2 days of abx, states the prednisone  made her feel out of her head   Reports L sided chest pain, SHOB and mid back pain.

## 2024-07-04 NOTE — Discharge Instructions (Addendum)
 Your ongoing cough seems to be secondary to bronchitis which is inflammation of the lungs secondary to a viral or bacterial infection.  This can take up to 4 weeks from initial symptom onset for symptoms to improve.  There is no treatment for bronchitis, this will need to improve on its own.   Although we do not see any pneumonia on your chest x-ray today, please continue on your doxycycline  as prescribed by your primary care provider to treat the pneumonia they saw on your x-ray at your appointment.  Your electrolytes (sodium, potassium, chloride, magnesium) were low today.  This could be contributing to you not feeling well.  You were given electrolytes through the IV and a potassium pill today.  You have been prescribed a potassium supplement to take at home to help keep your potassium in normal range.  Please start taking this potassium pill once daily starting tomorrow.  Please keep well-hydrated with water and also electrolyte solutions such as Pedialyte.  Your workup today is reassuring. It is very unlikely that your pain is due to an issue in your heart. Your cardiac enzyme (troponin) was normal today. Your EKG which is a measure of the heart's electrical activity and rhythm is normal today. These would both show abnormalities if you were having a heart attack. No signs of heart failure on your labs   Return to the ER if you have any fevers above 100.4, if you have persistent vomiting, if you have severe worsening of your symptoms, any other new or concerning symptoms

## 2024-07-04 NOTE — ED Provider Notes (Signed)
 " [place] [facility] AT [facility] Provider Note   CSN: [id number] Arrival date & time: 07/04/24  0930     Patient presents with: Shortness of Breath   Shannon Reyes is a 74 y.o. female with history of hypothyroidism, hypertension, presents with concern for ongoing malaise since being sick with what she thought was the flu 2 weeks ago.  She reports that she has had some improvement in her cough and congestion, but this has not completely resolved.  She denies any current body aches, chills, or fever.  Reports she was seen by her PCP and diagnosed with pneumonia, and started on doxycycline  and prednisone .  She has taken 3 days of the doxycycline  and does not report any change in her symptoms.  She reports that the prednisone  made her feel out of her head.  She also reports onset of left-sided chest pain and intermittent shortness of breath that started about 2 days ago.  Reports the pain seems to be fairly constant.  Does report increased pain with inspiration.  Denies any pain that radiates to the back.  Pain is nonexertional.    Shortness of Breath      Prior to Admission medications  Medication Sig Start Date End Date Taking? Authorizing Provider  potassium chloride  (KLOR-CON  M) 10 MEQ tablet Take 1 tablet (10 mEq total) by mouth daily. 07/05/24 07/12/24 Yes Darren Caldron, PA-C  acetaminophen  (TYLENOL ) 500 MG tablet Take 500 mg by mouth every 6 (six) hours as needed for moderate pain (pain score 4-6), mild pain (pain score 1-3) or fever.    [provider]  ARMOUR THYROID  120 MG tablet TAKE 1 TABLET BY MOUTH DAILY BEFORE BREAKFAST 08/01/23   Thedora Garnette HERO, MD  ARMOUR THYROID  15 MG tablet TAKE 1 TABLET BY MOUTH DAILY BEFORE BREAKFAST 05/27/24   Thedora Garnette HERO, MD  buPROPion  (WELLBUTRIN  XL) 300 MG 24 hr tablet Take 300 mg by mouth daily.    [provider]  doxycycline  (VIBRA -TABS) 100 MG tablet Take 1 tablet (100 mg total) by mouth 2 (two)  times daily. 07/01/24   Webb, Padonda B, FNP  EVEKEO 10 MG TABS Take 10 mg by mouth daily as needed (ADHD).  03/20/18   [provider]  guaiFENesin (MUCINEX) 600 MG 12 hr tablet Take by mouth 2 (two) times daily.    [provider]  lisinopril  (ZESTRIL ) 40 MG tablet TAKE 1 TABLET BY MOUTH 2 TIMES A DAY 01/24/24   Thedora Garnette HERO, MD  metFORMIN  (GLUCOPHAGE -XR) 500 MG 24 hr tablet Take 1 tablet (500 mg total) by mouth 2 (two) times daily with a meal. 03/28/24   Francyne Romano, MD  Multiple Vitamins-Minerals (MULTIVITAMIN) tablet Take 1 tablet by mouth daily. 02/02/23   Francyne Romano, MD  omeprazole  (PRILOSEC) 20 MG capsule Take 1 capsule (20 mg total) by mouth daily. 07/11/23   Beather Delon Gibson, PA  predniSONE  (DELTASONE ) 20 MG tablet Take 2 tablets (40 mg total) by mouth daily with breakfast. 07/01/24   Webb, Padonda B, FNP  rosuvastatin  (CRESTOR ) 5 MG tablet Take 1 tablet (5 mg total) by mouth daily. 04/30/24   Francyne Romano, MD  triamcinolone  cream (KENALOG ) 0.1 % Apply 1 Application topically daily as needed. 02/20/23   Thedora Garnette HERO, MD  WEGOVY  2.4 MG/0.75ML SOAJ SQ injection Inject 2.4 mg into the skin once a week. 05/27/24       Allergies: Erythromycin base, Prednisone , and Codeine    Review of Systems  Respiratory:  Positive for shortness of breath.     Updated Vital Signs BP (!) 141/81   Pulse 77   Temp 99 F (37.2 C) (Oral)   Resp 17   SpO2 100%   Physical Exam Vitals and nursing note reviewed.  Constitutional:      General: She is not in acute distress.    Appearance: She is well-developed.  HENT:     Head: Normocephalic and atraumatic.     Nose: No congestion.     Mouth/Throat:     Mouth: Mucous membranes are moist.     Pharynx: No oropharyngeal exudate or posterior oropharyngeal erythema.  Eyes:     Conjunctiva/sclera: Conjunctivae normal.  Cardiovascular:     Rate and Rhythm: Normal rate and regular rhythm.     Heart sounds: No  murmur heard. Pulmonary:     Effort: Pulmonary effort is normal. No respiratory distress.     Breath sounds: Normal breath sounds.     Comments: Talking in full sentences on room air without difficulty Abdominal:     Palpations: Abdomen is soft.     Tenderness: There is no abdominal tenderness.  Musculoskeletal:        General: No swelling.     Cervical back: Neck supple.     Right lower leg: No edema.     Left lower leg: No edema.  Skin:    General: Skin is warm and dry.     Capillary Refill: Capillary refill takes less than 2 seconds.  Neurological:     Mental Status: She is alert.  Psychiatric:        Mood and Affect: Mood normal.     (all labs ordered are listed, but only abnormal results are displayed) Labs Reviewed  BASIC METABOLIC PANEL WITH GFR - Abnormal; Notable for the following components:      Result Value   Sodium 132 (*)    Potassium 2.9 (*)    Chloride 94 (*)    All other components within normal limits  MAGNESIUM - Abnormal; Notable for the following components:   Magnesium 1.6 (*)    All other components within normal limits  RESP PANEL BY RT-PCR (RSV, FLU A&B, COVID)  RVPGX2  CBC  PRO BRAIN NATRIURETIC PEPTIDE  TROPONIN T, HIGH SENSITIVITY    EKG: EKG Interpretation Date/Time:  Thursday July 04 2024 09:41:09 EST Ventricular Rate:  86 PR Interval:  205 QRS Duration:  132 QT Interval:  395 QTC Calculation: 473 R Axis:   -49  Text Interpretation: Sinus rhythm Left bundle branch block Confirmed by Ruthe Cornet [phone]) on 07/04/2024 9:53:11 AM  Radiology: [facility] Chest 2 View Result Date: 07/04/2024 CLINICAL DATA:  Chest pain, shortness of breath EXAM: CHEST - 2 VIEW COMPARISON:  July 01, 2024 FINDINGS: The heart size and mediastinal contours are within normal limits. Both lungs are clear. The visualized skeletal structures are unremarkable. IMPRESSION: No active cardiopulmonary disease. Electronically Signed   By: Lynwood Landy Raddle M.D.   On:  07/04/2024 10:17     Procedures   Medications Ordered in the ED  sodium chloride  0.9 % bolus 1,000 mL (1,000 mLs Intravenous New Bag/Given 07/04/24 1036)  potassium chloride  SA (KLOR-CON  M) CR tablet 40 mEq (40 mEq Oral Given 07/04/24 1034)                                    Medical Decision Making  Amount and/or Complexity of Data Reviewed Labs: ordered. Radiology: ordered.  Risk Prescription drug management.     Differential diagnosis includes but is not limited to Viral URI, bronchitis, ACS, arrhythmia, aortic aneurysm, pericarditis, myocarditis, pericardial effusion, cardiac tamponade, musculoskeletal pain, GERD, Boerhaave's syndrome, DVT/PE, pneumonia, pleural effusion   ED Course:  Upon initial evaluation, patient is very well-appearing, no acute distress.  Normal vitals aside from her elevated blood pressure 151/78 upon arrival.  Lungs clear to auscultation bilaterally.  She is reporting some shortness of breath, but is able to talk in full sentences on room air without difficulty.  Regular rate and rhythm on cardiac auscultation.   Labs Ordered: I Ordered, and personally interpreted labs.  The pertinent results include:   CBC within normal limits BMP with hyponatremia at 132, hypokalemia 2.9, low chloride at 94 proBNP within normal limits Troponin within normal limits COVID, flu, RSV negative  Imaging Studies ordered: I ordered imaging studies including chest x-ray I independently visualized the imaging with scope of interpretation limited to determining acute life threatening conditions related to emergency care. Imaging showed no acute abnormality I agree with the radiologist interpretation   Cardiac Monitoring: / EKG: The patient was maintained on a cardiac monitor.  I personally viewed and interpreted the cardiac monitored which showed an underlying rhythm of: Normal sinus rhythm with left bundle branch block  Medications Given: NS bolus 40 mEq KCl  Upon  re-evaluation, patient remains well-appearing with stable vitals.   Low concern for ACS at this time given troponin within normal limits, and chest discomfort ongoing for 2 days (no indication for delta troponin), pain non-exertional, and EKG with normal sinus rhythm and no ST changes.  Low concern for CHF exacerbation given no signs of fluid overload on exam, BNP within normal limits. Low concern for DVT or PE at this time given no hypoxia, tachycardia.  Chest pain seems more consistent with musculoskeletal chest wall pain from coughing. Will  not d-dimer at this time given low clinical concern  Chest x-ray without any acute abnormality.  COVID, flu, RSV testing negative today.  Given her PCP felt she had pneumonia at her initial visit on 07/01/2024, we will have her continue on the doxycycline  she was prescribed.  However, feel her clinical picture is more consistent with viral bronchitis given the ongoing dry cough, no fevers. Patient's sodium, potassium, chloride, and magnesium were low today.  Suspect this may be contributing to her general feelings of malaise.  They was were repleted today with IV NS bolus, KCl tablet.  Patient discussed with my attending Dr. Ruthe who is in agreement with plan of care. He does not feel patient needs to be switched to different antibiotics at this time.   Patient stable and appropriate for discharge home at this time   Impression: Bronchitis Hyponatremia Hypokalemia Hypochloremia Hypomagnesemia  Disposition:  The patient was discharged home with instructions to keep hydrated at home with water and electrolyte solution such as Pedialyte.  Take potassium supplement as prescribed at home for the next 7 days.  Follow-up with PCP if symptoms not improving within the next 2 weeks, or starting to get worse.  I did offer patient Tessalon  Perles which she declines at this time. Return precautions given and patient verbalized understanding.    Record  Review: External records from outside source obtained and reviewed including PCP visit and chest x-ray from 07/01/2024     This chart was dictated using voice recognition software, Dragon. Despite the best efforts  of this provider to proofread and correct errors, errors may still occur which can change documentation meaning.       Final diagnoses:  Bronchitis  Hypokalemia  Hyponatremia  Hypochloremia    ED Discharge Orders          Ordered    potassium chloride  (KLOR-CON  M) 10 MEQ tablet  Daily        07/04/24 1138               Veta Palma, PA-C 07/04/24 1145    Ruthe Cornet, DO 07/04/24 1217  "

## 2024-07-10 ENCOUNTER — Other Ambulatory Visit: Payer: Self-pay | Admitting: Physician Assistant

## 2024-07-11 ENCOUNTER — Telehealth: Payer: Self-pay | Admitting: Family Medicine

## 2024-07-11 ENCOUNTER — Telehealth: Payer: Self-pay

## 2024-07-11 LAB — HM DIABETES EYE EXAM

## 2024-07-11 NOTE — Telephone Encounter (Signed)
 Called and spoke to patient in regarding frorm from insurance stating that they will not cover the Armour Thyroid  120 mg.   She will file an appeal online with them.  She has an appointment on 08/13/24.  She should have enough medication to get her to the appointment.  If not she will call back and schedule sooner. Dm/cma

## 2024-07-11 NOTE — Telephone Encounter (Signed)
 Copied from CRM [id number]. Topic: General - Call Back - No Documentation >> Jul 11, 2024  4:20 PM Alfonso ORN wrote: Reason for CRM: pt called to return call rec from [facility] at 3:30p. contacted cal for further assistance. cal advised Karna stated she would call pt back after assisting another pt. Relayed to pt

## 2024-07-12 ENCOUNTER — Encounter: Payer: Self-pay | Admitting: Family Medicine

## 2024-07-12 NOTE — Telephone Encounter (Signed)
 Spoke to patient yesterday.  Please see other message. Dm/cma

## 2024-07-15 ENCOUNTER — Other Ambulatory Visit (HOSPITAL_COMMUNITY): Payer: Self-pay

## 2024-07-15 ENCOUNTER — Encounter (INDEPENDENT_AMBULATORY_CARE_PROVIDER_SITE_OTHER): Payer: Self-pay | Admitting: Internal Medicine

## 2024-07-15 ENCOUNTER — Telehealth (INDEPENDENT_AMBULATORY_CARE_PROVIDER_SITE_OTHER): Admitting: Internal Medicine

## 2024-07-15 VITALS — BP 119/78 | Ht 63.0 in | Wt 153.0 lb

## 2024-07-15 DIAGNOSIS — R931 Abnormal findings on diagnostic imaging of heart and coronary circulation: Secondary | ICD-10-CM

## 2024-07-15 DIAGNOSIS — E88819 Insulin resistance, unspecified: Secondary | ICD-10-CM

## 2024-07-15 DIAGNOSIS — Z6827 Body mass index (BMI) 27.0-27.9, adult: Secondary | ICD-10-CM

## 2024-07-15 DIAGNOSIS — E663 Overweight: Secondary | ICD-10-CM

## 2024-07-15 DIAGNOSIS — Z9189 Other specified personal risk factors, not elsewhere classified: Secondary | ICD-10-CM | POA: Diagnosis not present

## 2024-07-15 DIAGNOSIS — I10 Essential (primary) hypertension: Secondary | ICD-10-CM | POA: Diagnosis not present

## 2024-07-15 DIAGNOSIS — Z8639 Personal history of other endocrine, nutritional and metabolic disease: Secondary | ICD-10-CM | POA: Insufficient documentation

## 2024-07-15 MED ORDER — WEGOVY 1.7 MG/0.75ML ~~LOC~~ SOAJ
1.7000 mg | SUBCUTANEOUS | 0 refills | Status: AC
  Filled 2024-07-15: qty 3, 28d supply, fill #0

## 2024-07-15 NOTE — Progress Notes (Unsigned)
 "  Virtual Visit via Video Note  I connected withNAME@ on 07/15/24 at 11:20 AM EST by a video enabled telemedicine application and verified that I am speaking with the correct person using two identifiers.  Location:  Patient: Home Provider: Office   I discussed the limitations of evaluation and management by telemedicine and the availability of in person appointments. The patient expressed understanding and agreed to proceed.    Weight Summary And Biometrics  Vitals BP: 119/78 (reported by pt via Th- date taken 06/22/2024)   Anthropometric Measurements Height: 5' 3 (1.6 m) Weight: 153 lb (69.4 kg) (reported via TH) BMI (Calculated): 27.11 Weight Lost Since Last Visit: 155 lb Starting Weight: 224 lb Total Weight Loss (lbs): 69 lb (31.3 kg) Peak Weight: 230 lb   No data recorded  RMR: [id number]  Today's Visit #: 24  Starting Date: 06/19/22   Subjective   Chief Complaint: Obesity  Discussed the use of AI scribe software for clinical note transcription with the patient, who gave verbal consent to proceed.  History of Present Illness Shannon Reyes is a 74 year old female who presents with concerns about insurance coverage for her Wegovy  medication.  She has been on Wegovy  since June 2024, experiencing significant weight loss. She follows a 1200 calorie nutrition plan and exercises regularly at a fitness center, engaging in cardio and strength training twice a week. She is transitioning from employer-based insurance to [facility] and is concerned about coverage for Wegovy .  Since starting our program she has lost 74 pounds close to 33% of total body weight over a year and 10 months on medically supervised weight management plan inclusive of GLP-1.  She had a BMI of 40.   During a recent illness, she and her husband contracted the flu, which led to her developing a mild case of pneumonia. She required antibiotics and was found to have low potassium, magnesium, and sodium levels,  for which she received supplements. Her condition improved, but she continues to experience a lingering cough and congestion. During her illness, she was unable to eat well and primarily consumed soup. She stopped taking Wegovy  during this period but has since resumed it without any issues, including no nausea upon restarting.  She has a history of insulin  resistance and is also on Crestor  and blood pressure medications.     Patient Reported Barriers to Progress: strong hunger signals and/or impaired satiety / inhibitory control and having difficulties with GLP-1 or AOM coverage.   Pharmacotherapy for weight management: She is currently taking Wegovy  with adequate clinical response  and without side effects..   Assessment and Plan   Treatment Plan For Obesity:  Recommended Dietary Goals  [name] is currently in the action stage of change. As such, her goal is to continue weight management plan. She has agreed to: follow a balanced (30%/40%/30%), whole foods-based, reduced-calorie meal plan (RCNP) targeting 1200 kcal per day  Behavioral Health and Counseling  We discussed the following behavioral modification strategies today: continue to work on maintaining a reduced calorie state, getting the recommended amount of protein, incorporating whole foods, making healthy choices, staying well hydrated and practicing mindfulness when eating. and increase protein intake, fibrous foods (25 grams per day for women, 30 grams for men) and water to improve satiety and decrease hunger signals. .  Additional education and resources provided today: None  Recommended Physical Activity Goals  Charday has been advised to work up to 150 minutes of moderate intensity aerobic activity a week and  strengthening exercises 2-3 times per week for cardiovascular health, weight loss maintenance and preservation of muscle mass.   She has agreed to :  Increase volume of physical activity to a goal of 240 minutes a week  and Combine aerobic and strengthening exercises for efficiency and improved cardiometabolic health.  Pharmacotherapy  We discussed various medication options to help [name] with her weight loss efforts and we both agreed to : Continue pharmacotherapy with Wegovy  to reduce the risk of weight regain after successful weight loss, lower cardiovascular risk, and improve cardiometabolic parameters. In addition to a prescribed reduced calorie nutrition plan (RCNP), behavioral strategies and physical activity, [name] would benefit from pharmacotherapy to assist with abnormal hunger signals, impaired satiety and cravings. This will reduce obesity-related health risks by inducing weight loss, and help reduce food consumption and adherence to [facility]). It may also improve QOL by improving self-confidence and reduce the setbacks associated with metabolic adaptations.  she also has high risk comorbidities associated with her obesity including: Hypertension, Hyperlipidemia, Prediabetes, Insulin  Resistance, and ASCVD. All of these conditions increase her risk for future complications and are either improved or potentially reverse through sustained weight loss.  GLP-1 receptor agonists have been shown in robust clinical trials to: Enhance satiety and delay gastric emptying, resulting in reduced caloric intake Improve insulin  sensitivity and glycemic control Promote clinically meaningful weight loss (>=5-22%) Reduce cardiovascular risk markers, including blood pressure, lipids, and inflammation.  She has an elevated coronary artery calcium  score.  Given [name]'s clinical profile--GLP-1 therapy is both indicated and expected to provide multifactorial benefit. The medication's mechanism aligns precisely with the patient's needs and addresses the physiologic underpinnings of weight gain.   After a detailed discussion covering treatment rationale, mechanism of action, common side effects, expected outcomes, risks, and  long-term use considerations, shared decision-making was used to continue Wegovy  but  at 1.7 mg once a week. The importance of ongoing lifestyle management and long-term adherence was emphasized, as was the potential for weight regain following discontinuation.  Associated Conditions Impacted by Obesity Treatment  Assessment & Plan Essential hypertension Vitals:   07/15/24 1000  BP: 119/78   Stable. Blood pressure control expected to improve with continued weight reduction. No medication changes indicated today; continue current antihypertensive regimen and home BP monitoring.   Insulin  resistance Her insulin  levels have not been recently checked but her hemoglobin A1c has improved on medically supervised weight management plan inclusive of metformin  and Wegovy .  Patient benefits from ongoing pharmacoprevention at this has improved glucose metabolism, insulin  resistance which resulted in improvement in cardiometabolic risk.  Patient is high risk for cardiovascular disease and benefits from ongoing treatment with semaglutide .  History of morbid obesity Overweight with body mass index (BMI) of 27 to 27.9 in adult  Obesity On medically supervised weight management plan resulted in significant weight loss of 74 pounds (32.6% of total body weight) over 13 months. Recent illness led to a temporary cessation of Wegovy , but she has resumed without issues. Transitioning to Medicare may affect Wegovy  coverage. Discussed potential out-of-pocket costs and alternative dosing strategies. Consideration of transitioning to a 1.7 mg dose for maintenance phase to prevent further weight loss and muscle loss. Discussed the option of using leftover pens to manage potential insurance delays. The pill form of Wegovy  is comparable in efficacy to the injection, with a 15-17% weight loss rate, but requires daily administration on an empty stomach. - Sent Wegovy  prescription to [facility] for prior authorization. - Documented  weight loss and cardiovascular  risk factors to support insurance coverage. - Consider transitioning to 1.7 mg dose for maintenance phase. - Use leftover pens to manage potential insurance delays without combining doses. - Discuss pill form of Wegovy  as an alternative if insurance issues persist. Agatston coronary artery calcium  score between 200 and 399 At increased risk for cardiovascular disease 10-year risk of heart attack or stroke is 17%, considered high risk. CAC scan confirms plaque buildup .Coronary calcium  score of 245 AU. This was 76th percentile for age-,race-, and sex-matched controls.  She has family history of CAD.  She is currently on rosuvastatin .           Objective   Physical Exam:  Blood pressure 119/78, height 5' 3 (1.6 m), weight 153 lb (69.4 kg). Body mass index is 27.1 kg/m.  General: She is overweight, cooperative, alert, well developed, and in no acute distress. PSYCH: Has normal mood, affect and thought process.   Lungs: Normal breathing effort, no conversational dyspnea.    Diagnostic Data Reviewed:  BMET    Component Value Date/Time   NA 132 (L) 07/04/2024 0939   NA 134 09/19/2023 1242   K 2.9 (L) 07/04/2024 0939   CL 94 (L) 07/04/2024 0939   CO2 25 07/04/2024 0939   GLUCOSE 91 07/04/2024 0939   BUN 18 07/04/2024 0939   BUN 18 09/19/2023 1242   CREATININE 0.52 07/04/2024 0939   CREATININE 0.66 04/06/2016 0931   CALCIUM  9.8 07/04/2024 0939   GFRNONAA >60 07/04/2024 0939   GFRAA >60 06/20/2018 0419   Lab Results  Component Value Date   HGBA1C 5.3 06/29/2022   HGBA1C 4.9 06/15/2018   Lab Results  Component Value Date   INSULIN  32.5 (H) 06/29/2022   Lab Results  Component Value Date   TSH 0.92 08/21/2023   CBC    Component Value Date/Time   WBC 6.0 07/04/2024 0939   RBC 4.30 07/04/2024 0939   HGB 13.1 07/04/2024 0939   HGB 14.4 06/29/2022 1050   HCT 37.0 07/04/2024 0939   HCT 42.4 06/29/2022 1050   PLT 250 07/04/2024 0939    PLT 254 06/29/2022 1050   MCV 86.0 07/04/2024 0939   MCV 95 06/29/2022 1050   MCH 30.5 07/04/2024 0939   MCHC 35.4 07/04/2024 0939   RDW 13.1 07/04/2024 0939   RDW 12.0 06/29/2022 1050   Iron Studies No results found for: IRON, TIBC, FERRITIN, IRONPCTSAT Lipid Panel     Component Value Date/Time   CHOL 179 06/29/2022 1050   TRIG 129 06/29/2022 1050   HDL 57 06/29/2022 1050   LDLCALC 99 06/29/2022 1050   Hepatic Function Panel     Component Value Date/Time   PROT 6.9 08/21/2023 1329   PROT 7.2 06/29/2022 1050   ALBUMIN 4.2 08/21/2023 1329   ALBUMIN 4.7 06/29/2022 1050   AST 15 08/21/2023 1329   ALT 20 08/21/2023 1329   ALKPHOS 40 08/21/2023 1329   BILITOT 0.3 08/21/2023 1329   BILITOT 0.3 06/29/2022 1050      Component Value Date/Time   TSH 0.92 08/21/2023 1329   Nutritional Lab Results  Component Value Date   VD25OH 42.1 06/29/2022    Follow-Up   Return in about 8 weeks (around 09/09/2024) for For Weight Mangement with Dr. [email or web address].[email or web address] She was informed of the importance of frequent follow up visits to maximize her success with intensive lifestyle modifications for her multiple health conditions.  Attestation Statement   Reviewed by clinician on day of visit: allergies, medications, problem list,  medical history, surgical history, family history, social history, and previous encounter notes.   I discussed the assessment and treatment plan with the patient. The patient was provided an opportunity to ask questions and all were answered. The patient agreed with the plan and demonstrated an understanding of the instructions.   The patient was advised to call back or seek an in-person evaluation if the symptoms worsen or if the condition fails to improve as anticipated.  I have spent  minutes in the care of the patient today including: {NUMBER 1-10:22536} minutes before the visit reviewing and preparing the chart. *** minutes face-to-face  {emfacetoface:[id number]::assessing and reviewing listed medical problems as outlined in obesity care plan,providing nutritional and behavioral counseling on topics outlined in the obesity care plan,independently interpreting test results and goals of care, as described in assessment and plan,reviewing and discussing biometric information and progress} {NUMBER 1-10:22536} minutes after the visit updating chart and documentation of encounter.    Lucas Parker, MD   "

## 2024-07-15 NOTE — Assessment & Plan Note (Signed)
 10-year risk of heart attack or stroke is 17%, considered high risk. CAC scan confirms plaque buildup .Coronary calcium  score of 245 AU. This was 76th percentile for age-,race-, and sex-matched controls.  She has family history of CAD.  She is currently on rosuvastatin .

## 2024-07-15 NOTE — Assessment & Plan Note (Signed)
 Her insulin  levels have not been recently checked but her hemoglobin A1c has improved on medically supervised weight management plan inclusive of metformin  and Wegovy .  Patient benefits from ongoing pharmacoprevention at this has improved glucose metabolism, insulin  resistance which resulted in improvement in cardiometabolic risk.  Patient is high risk for cardiovascular disease and benefits from ongoing treatment with semaglutide .

## 2024-07-15 NOTE — Progress Notes (Unsigned)
"  °  Date of Visit      07/15/2024  reason for a virtual visit today is due to inclement weather     MyChart Video Visit Questions (ALL QUESTIONS MUST BE ANSWERED TO PROCEED with your TELEHEALTH VISIT)  Do you weigh yourself at home?   yes           Date taken   07/15/24     Weight 153 lb  If no, do you feel like you have gained or lost weight since last visit? (How much)    Meal Plan    #2                       Followed          100%   What medications will you need refilled in the next 2-3 weeks and pharmacy name?    Wegovy   Are you exercising?    yes             Type:  cardio and strength  Days per wk.      2                        Minutes: 45 Pt was sick 1/10 and has been snowed in  Do you check your BP or BS at home?      119/78            If so, what were the last reading? 06/22/24  Please type yes or for the following:  Tracking calories/macros:     no               Eating more whole foods (fruits, veggies, etc.)     yes  Getting recommended amount of protein:     yes        Drinking enough water: no      Are you skipping meals: yes Due to sickness     Sleeping between 7-9 hours per night: yes     For documentation purposes only, what is your location right now? (home, work, etc.)  home   Will anyone else be on the visit with you today? If so, what is their name and relation to you?  No  Pt reports was sick for 4 weeks May decrease Wegovy  (you mentioned it to her last ov).     FOR SECURITY REASONS, WE ASK THAT YOU DO NOT OPERATE A MOTOR VEHICLE WHILE ON THE VISIT WITH PROVIDER.             "

## 2024-07-15 NOTE — Assessment & Plan Note (Signed)
 Vitals:   07/15/24 1000  BP: 119/78   Stable. Blood pressure control expected to improve with continued weight reduction. No medication changes indicated today; continue current antihypertensive regimen and home BP monitoring.

## 2024-07-18 ENCOUNTER — Telehealth (HOSPITAL_COMMUNITY): Payer: Self-pay

## 2024-07-18 ENCOUNTER — Other Ambulatory Visit (HOSPITAL_COMMUNITY): Payer: Self-pay

## 2024-07-18 NOTE — Telephone Encounter (Signed)
 Where is this request coming from? [facility]  Medication: Wegovy  1.7mg  Prior authorization required? Yes If YES, on primary or secondary insurance? Primary Comments:

## 2024-07-19 ENCOUNTER — Telehealth (HOSPITAL_COMMUNITY): Payer: Self-pay

## 2024-07-19 ENCOUNTER — Other Ambulatory Visit (HOSPITAL_COMMUNITY): Payer: Self-pay

## 2024-07-19 NOTE — Telephone Encounter (Signed)
 PA request has been Received. New Encounter has been or will be created for follow up. For additional info see Pharmacy Prior Auth telephone encounter from 07/19/24.

## 2024-07-19 NOTE — Telephone Encounter (Signed)
 Pharmacy Patient Advocate Encounter   Received notification from Pt Calls Messages that prior authorization for Wegovy  1.7 mg/0.75 ml auto injectors is required/requested.   Insurance verification completed.   The patient is insured through [employer].   Per test claim: Per test claim, medication is not covered due to plan/benefit exclusion, PA not submitted at this time

## 2024-08-20 ENCOUNTER — Ambulatory Visit: Admitting: Family Medicine

## 2024-08-30 ENCOUNTER — Encounter

## 2024-09-09 ENCOUNTER — Ambulatory Visit (INDEPENDENT_AMBULATORY_CARE_PROVIDER_SITE_OTHER): Admitting: Internal Medicine
